# Patient Record
Sex: Female | Born: 1955 | Race: White | Hispanic: No
Health system: Southern US, Community
[De-identification: ages and names within clinical notes are randomized; demographics above are authoritative.]

## PROBLEM LIST (undated history)

## (undated) DIAGNOSIS — R319 Hematuria, unspecified: Secondary | ICD-10-CM

## (undated) DIAGNOSIS — I10 Essential (primary) hypertension: Secondary | ICD-10-CM

## (undated) DIAGNOSIS — C50919 Malignant neoplasm of unspecified site of unspecified female breast: Secondary | ICD-10-CM

## (undated) DIAGNOSIS — Z9221 Personal history of antineoplastic chemotherapy: Secondary | ICD-10-CM

## (undated) DIAGNOSIS — Z923 Personal history of irradiation: Secondary | ICD-10-CM

## (undated) DIAGNOSIS — K219 Gastro-esophageal reflux disease without esophagitis: Secondary | ICD-10-CM

## (undated) DIAGNOSIS — F419 Anxiety disorder, unspecified: Secondary | ICD-10-CM

## (undated) DIAGNOSIS — C3490 Malignant neoplasm of unspecified part of unspecified bronchus or lung: Secondary | ICD-10-CM

## (undated) DIAGNOSIS — S82891A Other fracture of right lower leg, initial encounter for closed fracture: Secondary | ICD-10-CM

## (undated) DIAGNOSIS — M1990 Unspecified osteoarthritis, unspecified site: Secondary | ICD-10-CM

## (undated) DIAGNOSIS — K259 Gastric ulcer, unspecified as acute or chronic, without hemorrhage or perforation: Secondary | ICD-10-CM

## (undated) DIAGNOSIS — C801 Malignant (primary) neoplasm, unspecified: Secondary | ICD-10-CM

## (undated) DIAGNOSIS — IMO0001 Reserved for inherently not codable concepts without codable children: Secondary | ICD-10-CM

## (undated) DIAGNOSIS — J301 Allergic rhinitis due to pollen: Secondary | ICD-10-CM

## (undated) DIAGNOSIS — Z87442 Personal history of urinary calculi: Secondary | ICD-10-CM

## (undated) DIAGNOSIS — N200 Calculus of kidney: Secondary | ICD-10-CM

## (undated) HISTORY — PX: ESOPHAGOGASTRODUODENOSCOPY: SHX1529

## (undated) HISTORY — PX: LITHOTRIPSY: SUR834

## (undated) HISTORY — DX: Unspecified osteoarthritis, unspecified site: M19.90

## (undated) HISTORY — DX: Allergic rhinitis due to pollen: J30.1

## (undated) HISTORY — DX: Essential (primary) hypertension: I10

## (undated) HISTORY — DX: Malignant (primary) neoplasm, unspecified: C80.1

## (undated) HISTORY — DX: Hematuria, unspecified: R31.9

## (undated) HISTORY — PX: COLONOSCOPY: SHX174

## (undated) HISTORY — PX: DENTAL SURGERY: SHX609

---
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
## 1998-06-15 HISTORY — PX: ANKLE FUSION: SHX881

## 1998-09-05 ENCOUNTER — Other Ambulatory Visit: Admission: RE | Admit: 1998-09-05 | Discharge: 1998-09-05 | Payer: Self-pay | Admitting: Gynecology

## 2000-04-28 ENCOUNTER — Other Ambulatory Visit: Admission: RE | Admit: 2000-04-28 | Discharge: 2000-04-28 | Payer: Self-pay | Admitting: Gynecology

## 2000-06-15 DIAGNOSIS — C801 Malignant (primary) neoplasm, unspecified: Secondary | ICD-10-CM

## 2000-06-15 HISTORY — DX: Malignant (primary) neoplasm, unspecified: C80.1

## 2000-06-15 HISTORY — PX: BREAST LUMPECTOMY: SHX2

## 2000-11-15 ENCOUNTER — Encounter: Admission: RE | Admit: 2000-11-15 | Discharge: 2000-11-15 | Payer: Self-pay | Admitting: *Deleted

## 2000-11-15 ENCOUNTER — Encounter: Payer: Self-pay | Admitting: *Deleted

## 2000-11-15 ENCOUNTER — Other Ambulatory Visit: Admission: RE | Admit: 2000-11-15 | Discharge: 2000-11-15 | Payer: Self-pay | Admitting: *Deleted

## 2000-11-25 ENCOUNTER — Ambulatory Visit (HOSPITAL_BASED_OUTPATIENT_CLINIC_OR_DEPARTMENT_OTHER): Admission: RE | Admit: 2000-11-25 | Discharge: 2000-11-25 | Payer: Self-pay | Admitting: General Surgery

## 2000-11-30 ENCOUNTER — Ambulatory Visit: Admission: RE | Admit: 2000-11-30 | Discharge: 2001-01-12 | Payer: Self-pay | Admitting: General Surgery

## 2000-12-23 ENCOUNTER — Encounter: Payer: Self-pay | Admitting: *Deleted

## 2000-12-23 ENCOUNTER — Ambulatory Visit (HOSPITAL_COMMUNITY): Admission: RE | Admit: 2000-12-23 | Discharge: 2000-12-23 | Payer: Self-pay | Admitting: *Deleted

## 2000-12-28 ENCOUNTER — Encounter: Payer: Self-pay | Admitting: General Surgery

## 2000-12-28 ENCOUNTER — Ambulatory Visit (HOSPITAL_COMMUNITY): Admission: RE | Admit: 2000-12-28 | Discharge: 2000-12-28 | Payer: Self-pay | Admitting: General Surgery

## 2001-01-13 ENCOUNTER — Ambulatory Visit: Admission: RE | Admit: 2001-01-13 | Discharge: 2001-04-13 | Payer: Self-pay | Admitting: General Surgery

## 2001-03-23 ENCOUNTER — Encounter: Payer: Self-pay | Admitting: *Deleted

## 2001-03-23 ENCOUNTER — Ambulatory Visit (HOSPITAL_COMMUNITY): Admission: RE | Admit: 2001-03-23 | Discharge: 2001-03-23 | Payer: Self-pay | Admitting: *Deleted

## 2001-04-07 ENCOUNTER — Ambulatory Visit (HOSPITAL_BASED_OUTPATIENT_CLINIC_OR_DEPARTMENT_OTHER): Admission: RE | Admit: 2001-04-07 | Discharge: 2001-04-07 | Payer: Self-pay | Admitting: General Surgery

## 2001-04-18 ENCOUNTER — Ambulatory Visit: Admission: RE | Admit: 2001-04-18 | Discharge: 2001-07-17 | Payer: Self-pay | Admitting: Radiation Oncology

## 2001-04-28 ENCOUNTER — Other Ambulatory Visit: Admission: RE | Admit: 2001-04-28 | Discharge: 2001-04-28 | Payer: Self-pay | Admitting: Gynecology

## 2001-06-15 DIAGNOSIS — C50919 Malignant neoplasm of unspecified site of unspecified female breast: Secondary | ICD-10-CM

## 2001-06-15 DIAGNOSIS — Z923 Personal history of irradiation: Secondary | ICD-10-CM

## 2001-06-15 DIAGNOSIS — Z9221 Personal history of antineoplastic chemotherapy: Secondary | ICD-10-CM

## 2001-06-15 HISTORY — PX: BREAST EXCISIONAL BIOPSY: SUR124

## 2001-06-15 HISTORY — DX: Personal history of irradiation: Z92.3

## 2001-06-15 HISTORY — DX: Malignant neoplasm of unspecified site of unspecified female breast: C50.919

## 2001-06-15 HISTORY — DX: Personal history of antineoplastic chemotherapy: Z92.21

## 2001-08-04 ENCOUNTER — Encounter: Admission: RE | Admit: 2001-08-04 | Discharge: 2001-08-04 | Payer: Self-pay | Admitting: *Deleted

## 2001-08-04 ENCOUNTER — Encounter: Payer: Self-pay | Admitting: *Deleted

## 2002-01-25 ENCOUNTER — Encounter: Payer: Self-pay | Admitting: *Deleted

## 2002-01-25 ENCOUNTER — Encounter: Admission: RE | Admit: 2002-01-25 | Discharge: 2002-01-25 | Payer: Self-pay | Admitting: *Deleted

## 2002-08-14 ENCOUNTER — Encounter: Admission: RE | Admit: 2002-08-14 | Discharge: 2002-08-14 | Payer: Self-pay | Admitting: *Deleted

## 2002-08-14 ENCOUNTER — Encounter: Payer: Self-pay | Admitting: *Deleted

## 2002-08-24 ENCOUNTER — Other Ambulatory Visit: Admission: RE | Admit: 2002-08-24 | Discharge: 2002-08-24 | Payer: Self-pay | Admitting: Obstetrics and Gynecology

## 2003-02-05 ENCOUNTER — Encounter: Payer: Self-pay | Admitting: Oncology

## 2003-02-05 ENCOUNTER — Encounter: Admission: RE | Admit: 2003-02-05 | Discharge: 2003-02-05 | Payer: Self-pay | Admitting: Oncology

## 2003-03-15 ENCOUNTER — Ambulatory Visit (HOSPITAL_COMMUNITY): Admission: RE | Admit: 2003-03-15 | Discharge: 2003-03-15 | Payer: Self-pay | Admitting: Obstetrics and Gynecology

## 2003-04-25 ENCOUNTER — Encounter: Admission: RE | Admit: 2003-04-25 | Discharge: 2003-04-25 | Payer: Self-pay | Admitting: Internal Medicine

## 2003-09-04 ENCOUNTER — Encounter: Admission: RE | Admit: 2003-09-04 | Discharge: 2003-09-04 | Payer: Self-pay | Admitting: Oncology

## 2004-03-03 ENCOUNTER — Encounter: Admission: RE | Admit: 2004-03-03 | Discharge: 2004-03-03 | Payer: Self-pay | Admitting: Oncology

## 2004-03-03 ENCOUNTER — Other Ambulatory Visit: Admission: RE | Admit: 2004-03-03 | Discharge: 2004-03-03 | Payer: Self-pay | Admitting: Obstetrics and Gynecology

## 2004-10-07 ENCOUNTER — Encounter: Admission: RE | Admit: 2004-10-07 | Discharge: 2004-10-07 | Payer: Self-pay | Admitting: Oncology

## 2005-02-23 ENCOUNTER — Ambulatory Visit: Payer: Self-pay | Admitting: Oncology

## 2005-12-28 ENCOUNTER — Encounter: Admission: RE | Admit: 2005-12-28 | Discharge: 2005-12-28 | Payer: Self-pay | Admitting: Internal Medicine

## 2006-02-18 ENCOUNTER — Other Ambulatory Visit: Admission: RE | Admit: 2006-02-18 | Discharge: 2006-02-18 | Payer: Self-pay | Admitting: Internal Medicine

## 2006-02-19 ENCOUNTER — Ambulatory Visit: Payer: Self-pay | Admitting: Oncology

## 2007-09-19 ENCOUNTER — Ambulatory Visit: Payer: Self-pay

## 2008-11-28 ENCOUNTER — Ambulatory Visit: Payer: Self-pay

## 2010-02-05 ENCOUNTER — Ambulatory Visit: Payer: Self-pay

## 2010-02-13 DIAGNOSIS — S82891A Other fracture of right lower leg, initial encounter for closed fracture: Secondary | ICD-10-CM

## 2010-02-13 HISTORY — DX: Other fracture of right lower leg, initial encounter for closed fracture: S82.891A

## 2010-07-06 ENCOUNTER — Encounter: Payer: Self-pay | Admitting: Internal Medicine

## 2011-11-17 ENCOUNTER — Ambulatory Visit: Payer: Self-pay

## 2013-01-12 ENCOUNTER — Ambulatory Visit: Payer: Self-pay | Admitting: Internal Medicine

## 2013-01-13 LAB — HM MAMMOGRAPHY

## 2013-03-01 ENCOUNTER — Ambulatory Visit: Payer: Self-pay

## 2013-08-17 ENCOUNTER — Encounter: Payer: Self-pay | Admitting: Family Medicine

## 2013-08-17 ENCOUNTER — Ambulatory Visit (INDEPENDENT_AMBULATORY_CARE_PROVIDER_SITE_OTHER): Payer: 59 | Admitting: Family Medicine

## 2013-08-17 VITALS — BP 158/102 | HR 84 | Temp 97.9°F | Ht 62.0 in | Wt 163.2 lb

## 2013-08-17 DIAGNOSIS — F419 Anxiety disorder, unspecified: Secondary | ICD-10-CM

## 2013-08-17 DIAGNOSIS — Z8249 Family history of ischemic heart disease and other diseases of the circulatory system: Secondary | ICD-10-CM

## 2013-08-17 DIAGNOSIS — R030 Elevated blood-pressure reading, without diagnosis of hypertension: Secondary | ICD-10-CM

## 2013-08-17 DIAGNOSIS — F32A Depression, unspecified: Secondary | ICD-10-CM | POA: Insufficient documentation

## 2013-08-17 DIAGNOSIS — F329 Major depressive disorder, single episode, unspecified: Secondary | ICD-10-CM | POA: Insufficient documentation

## 2013-08-17 DIAGNOSIS — Z1211 Encounter for screening for malignant neoplasm of colon: Secondary | ICD-10-CM

## 2013-08-17 DIAGNOSIS — IMO0001 Reserved for inherently not codable concepts without codable children: Secondary | ICD-10-CM | POA: Insufficient documentation

## 2013-08-17 DIAGNOSIS — F411 Generalized anxiety disorder: Secondary | ICD-10-CM

## 2013-08-17 LAB — LIPID PANEL
CHOL/HDL RATIO: 2
CHOLESTEROL: 234 mg/dL — AB (ref 0–200)
HDL: 106.1 mg/dL (ref >39.00)
LDL Cholesterol: 105 mg/dL — ABNORMAL HIGH (ref 0–99)
TRIGLYCERIDES: 113 mg/dL (ref 0.0–149.0)
VLDL: 22.6 mg/dL (ref 0.0–40.0)

## 2013-08-17 LAB — CBC WITH DIFFERENTIAL/PLATELET
BASOS PCT: 0.4 % (ref 0.0–3.0)
Basophils Absolute: 0 10*3/uL (ref 0.0–0.1)
EOS ABS: 0 10*3/uL (ref 0.0–0.7)
EOS PCT: 0.6 % (ref 0.0–5.0)
HCT: 38.1 % (ref 36.0–46.0)
HEMOGLOBIN: 13 g/dL (ref 12.0–15.0)
LYMPHS PCT: 32 % (ref 12.0–46.0)
Lymphs Abs: 2 10*3/uL (ref 0.7–4.0)
MCHC: 34.1 g/dL (ref 30.0–36.0)
MCV: 100 fl (ref 78.0–100.0)
Monocytes Absolute: 0.5 10*3/uL (ref 0.1–1.0)
Monocytes Relative: 7.4 % (ref 3.0–12.0)
NEUTROS ABS: 3.7 10*3/uL (ref 1.4–7.7)
Neutrophils Relative %: 59.6 % (ref 43.0–77.0)
Platelets: 259 10*3/uL (ref 150.0–400.0)
RBC: 3.81 Mil/uL — AB (ref 3.87–5.11)
RDW: 14.1 % (ref 11.5–14.6)
WBC: 6.2 10*3/uL (ref 4.5–10.5)

## 2013-08-17 LAB — TSH: TSH: 0.57 u[IU]/mL (ref 0.35–5.50)

## 2013-08-17 LAB — COMPREHENSIVE METABOLIC PANEL
ALBUMIN: 4.2 g/dL (ref 3.5–5.2)
ALT: 19 U/L (ref 0–35)
AST: 25 U/L (ref 0–37)
Alkaline Phosphatase: 57 U/L (ref 39–117)
BUN: 21 mg/dL (ref 6–23)
CALCIUM: 9.6 mg/dL (ref 8.4–10.5)
CHLORIDE: 106 meq/L (ref 96–112)
CO2: 26 mEq/L (ref 19–32)
CREATININE: 0.8 mg/dL (ref 0.4–1.2)
GFR: 82.07 mL/min (ref >60.00)
GLUCOSE: 93 mg/dL (ref 70–99)
POTASSIUM: 4.1 meq/L (ref 3.5–5.1)
Sodium: 139 mEq/L (ref 135–145)
Total Bilirubin: 0.7 mg/dL (ref 0.3–1.2)
Total Protein: 7.4 g/dL (ref 6.0–8.3)

## 2013-08-17 MED ORDER — TRAZODONE HCL 50 MG PO TABS: 25.0000 mg | ORAL_TABLET | Freq: Every evening | ORAL | Status: DC | PRN

## 2013-08-17 NOTE — Assessment & Plan Note (Signed)
>  45 minutes spent in face to face time with patient, >50% spent in counselling or coordination of care. Agreed to psychotherapy.  Will refer to Hilda Blades here. Trazadone prn insomnia.

## 2013-08-17 NOTE — Assessment & Plan Note (Signed)
?  due to anxiety. Follow up in 2 weeks-if remains elevated, will consider antihypertensives.

## 2013-08-17 NOTE — Assessment & Plan Note (Signed)
Risk stratify with lipid panel and CMET today.

## 2013-08-17 NOTE — Progress Notes (Signed)
Subjective:   Patient ID: Sarah Weeks, female    DOB: [birth year], 58 y.o.   MRN: [id number]  Sarah Weeks is a pleasant 58 y.o. year old female who presents to clinic today with Establish Care and Anxiety  on 08/17/2013  HPI: Has not had health insurance for years.  She did have breast CA in 2002 and has been getting yearly mammograms- last mammogram and pap smear at [facility] in 01/2013.  BP elevated today- under a lot of stressors.  Brings in BP log from previous MD- BP 120/80 and 138/90 last year.  Was previously on low dose HCTZ but it caused blurred vision.  Sister died a year ago last month- suicide and she has a lot of guilt and sadness.  Now caring for her ill mother and feels anxious most of the time.  Denies feeling depressed but does get tearful when she thinks or talks about her sister.  Strong FH of CAD- dad, sister and brother all had MI in their 42s.  She is a former smoker.  Has never had CP but has had palpitations- thinks they were panic attacks.    Not sleeping well.  No Si or HI.  Never had a colonoscopy.  Patient Active Problem List   Diagnosis Date Noted  . Family history of early CAD 08/17/2013  . Elevated blood pressure 08/17/2013  . Anxiety state, unspecified 08/17/2013   Past Medical History  Diagnosis Date  . Arthritis   . Cancer 2002    breast cancer  . Hay fever   . Hypertension    Past Surgical History  Procedure Laterality Date  . Breast lumpectomy  2002    L side  . Ankle fusion  2000   History  Substance Use Topics  . Smoking status: Former Research scientist (life sciences)  . Smokeless tobacco: Never Used  . Alcohol Use: Yes   Family History  Problem Relation Age of Onset  . Cancer Mother   . Alcohol abuse Father   . Heart disease Father   . Hypertension Father   . Cancer Sister   . Heart disease Sister   . Heart disease Brother   . Stroke Maternal Grandfather   . Diabetes Maternal Grandfather    Allergies  Allergen Reactions  . Hctz  [Hydrochlorothiazide]     Blurred vision    No current outpatient prescriptions on file prior to visit.   No current facility-administered medications on file prior to visit.   The PMH, PSH, Social History, Family History, Medications, and allergies have been reviewed in [facility], and have been updated if relevant.   Review of Systems  Unable to perform ROS Constitutional: Negative for fever, appetite change, fatigue and unexpected weight change.  Cardiovascular: Positive for palpitations. Negative for chest pain.  Neurological: Negative for seizures, light-headedness, numbness and headaches.  Psychiatric/Behavioral: Positive for sleep disturbance. Negative for suicidal ideas and self-injury. The patient is nervous/anxious.   All other systems reviewed and are negative.       Objective:  BP 168/84  Pulse 84  Temp(Src) 97.9 F (36.6 C) (Oral)  Ht 5\' 2"  (1.575 m)  Wt 163 lb 4 oz (74.05 kg)  BMI 29.85 kg/m2  SpO2 98%   Physical Exam  Nursing note and vitals reviewed. Constitutional: She appears well-developed and well-nourished. No distress.  HENT:  Head: Normocephalic and atraumatic.  Eyes: Pupils are equal, round, and reactive to light.  Cardiovascular: Normal rate and regular rhythm.   Skin: Skin is warm, dry  and intact.  Psychiatric: She has a normal mood and affect. Her speech is normal and behavior is normal. Judgment and thought content normal. Cognition and memory are normal.  Tearful but appropriate          Assessment & Plan:   No diagnosis found. No Follow-up on file.

## 2013-08-17 NOTE — Patient Instructions (Signed)
It was great to meet you.  Let's start Trazadone as needed at bedtime.  We will call you with your GI (colonoscopy) and therapy referrals.  Please come see me in 2 weeks.

## 2013-08-17 NOTE — Progress Notes (Signed)
Pre visit review using our clinic review tool, if applicable. No additional management support is needed unless otherwise documented below in the visit note. 

## 2013-08-18 ENCOUNTER — Encounter: Payer: Self-pay | Admitting: *Deleted

## 2013-08-25 ENCOUNTER — Encounter: Payer: Self-pay | Admitting: Internal Medicine

## 2013-08-25 ENCOUNTER — Ambulatory Visit (INDEPENDENT_AMBULATORY_CARE_PROVIDER_SITE_OTHER): Payer: 59 | Admitting: Internal Medicine

## 2013-08-25 VITALS — BP 120/78 | HR 81 | Temp 98.3°F | Wt 160.5 lb

## 2013-08-25 DIAGNOSIS — G44209 Tension-type headache, unspecified, not intractable: Secondary | ICD-10-CM

## 2013-08-25 MED ORDER — NAPROXEN 500 MG PO TABS: 500.0000 mg | ORAL_TABLET | Freq: Two times a day (BID) | ORAL | Status: DC

## 2013-08-25 NOTE — Progress Notes (Signed)
Subjective:    Patient ID: Sarah Weeks, female    DOB: 1955/08/26, 58 y.o.   MRN: [id number]  HPI  Pt presents to the clinic today with c/o neck pain and headache. She reports this started 3 weeks ago. The headaches are constant. She wakes up with it and goes to bed with it. She denies dizziness and blurred vision. She denies sensitivity to light and sound. She has tried Ibuprofen OTC without any relief. She does report that her mom did give her a pain pill last night (she doesn't know what kind) but she reports it made her throw up.  Review of Systems      Past Medical History  Diagnosis Date  . Arthritis   . Cancer 2002    breast cancer  . Hay fever   . Hypertension     Current Outpatient Prescriptions  Medication Sig Dispense Refill  . Biotin 7500 MCG TABS Take 1 tablet by mouth daily.      . Cholecalciferol (VITAMIN D3) 1000 UNITS CAPS Take 1 capsule by mouth.      Marland Kitchen ibuprofen (ADVIL,MOTRIN) 200 MG tablet Take 200 mg by mouth daily as needed.      . Multiple Vitamin (MULTIVITAMIN) tablet Take 2 tablets by mouth daily.       No current facility-administered medications for this visit.    Allergies  Allergen Reactions  . Hctz [Hydrochlorothiazide]     Blurred vision     Family History  Problem Relation Age of Onset  . Cancer Mother   . Alcohol abuse Father   . Heart disease Father   . Hypertension Father   . Cancer Sister   . Heart disease Sister   . Heart disease Brother   . Stroke Maternal Grandfather   . Diabetes Maternal Grandfather     History   Social History  . Marital Status: Married    Spouse Name: N/A    Number of Children: N/A  . Years of Education: N/A   Occupational History  . Not on file.   Social History Main Topics  . Smoking status: Former Research scientist (life sciences)  . Smokeless tobacco: Never Used  . Alcohol Use: Yes     Comment: occasional  . Drug Use: No  . Sexual Activity: No   Other Topics Concern  . Not on file   Social History  Narrative  . No narrative on file     Constitutional: Pt reports headache. Denies fever, malaise, fatigue, or abrupt weight changes.  HEENT: Denies eye pain, eye redness, ear pain, ringing in the ears, wax buildup, runny nose, nasal congestion, bloody nose, or sore throat. Neurological: Denies dizziness, difficulty with memory, difficulty with speech or problems with balance and coordination.   No other specific complaints in a complete review of systems (except as listed in HPI above).  Objective:   Physical Exam   BP 120/78  Pulse 81  Temp(Src) 98.3 F (36.8 C) (Oral)  Wt 160 lb 8 oz (72.802 kg)  SpO2 98% Wt Readings from Last 3 Encounters:  08/25/13 160 lb 8 oz (72.802 kg)  08/17/13 163 lb 4 oz (74.05 kg)    General: Appears her stated age, well developed, well nourished in NAD. HEENT: Head: normal shape and size; Eyes: sclera white, no icterus, conjunctiva pink, PERRLA and EOMs intact; Ears: Tm's gray and intact, normal light reflex; Nose: mucosa pink and moist, septum midline; Throat/Mouth: Teeth present, mucosa pink and moist, no exudate, lesions or ulcerations noted.  Cardiovascular: Normal rate and rhythm. S1,S2 noted.  No murmur, rubs or gallops noted. No JVD or BLE edema. No carotid bruits noted. Pulmonary/Chest: Normal effort and positive vesicular breath sounds. No respiratory distress. No wheezes, rales or ronchi noted.  Neurological: Alert and oriented. Cranial nerves II-XII intact. Coordination normal. +DTRs bilaterally.   BMET    Component Value Date/Time   NA 139 08/17/2013 1519   K 4.1 08/17/2013 1519   CL 106 08/17/2013 1519   CO2 26 08/17/2013 1519   GLUCOSE 93 08/17/2013 1519   BUN 21 08/17/2013 1519   CREATININE 0.8 08/17/2013 1519   CALCIUM 9.6 08/17/2013 1519    Lipid Panel     Component Value Date/Time   CHOL 234* 08/17/2013 1519   TRIG 113.0 08/17/2013 1519   HDL 106.10 08/17/2013 1519   CHOLHDL 2 08/17/2013 1519   VLDL 22.6 08/17/2013 1519   LDLCALC 105*  08/17/2013 1519    CBC    Component Value Date/Time   WBC 6.2 08/17/2013 1519   RBC 3.81* 08/17/2013 1519   HGB 13.0 08/17/2013 1519   HCT 38.1 08/17/2013 1519   PLT 259.0 08/17/2013 1519   MCV 100.0 08/17/2013 1519   MCHC 34.1 08/17/2013 1519   RDW 14.1 08/17/2013 1519   LYMPHSABS 2.0 08/17/2013 1519   MONOABS 0.5 08/17/2013 1519   EOSABS 0.0 08/17/2013 1519   BASOSABS 0.0 08/17/2013 1519    Hgb A1C No results found for this basename: HGBA1C        Assessment & Plan:   Tension headache secondary to stress:  Encouraged neck exercises, handout given Try massage therapy for some relief eRx for Naproxen 500 mg BID with meals Do not take Ibuprofen while taking the Naproxen  RTC as needed or if symptoms persist or worsen

## 2013-08-25 NOTE — Progress Notes (Signed)
Pre visit review using our clinic review tool, if applicable. No additional management support is needed unless otherwise documented below in the visit note. 

## 2013-08-25 NOTE — Patient Instructions (Addendum)

## 2013-09-01 ENCOUNTER — Ambulatory Visit (INDEPENDENT_AMBULATORY_CARE_PROVIDER_SITE_OTHER): Payer: 59 | Admitting: Family Medicine

## 2013-09-01 ENCOUNTER — Encounter: Payer: Self-pay | Admitting: Family Medicine

## 2013-09-01 VITALS — BP 142/92 | HR 102 | Temp 97.9°F | Wt 162.5 lb

## 2013-09-01 DIAGNOSIS — G44209 Tension-type headache, unspecified, not intractable: Secondary | ICD-10-CM

## 2013-09-01 MED ORDER — CYCLOBENZAPRINE HCL 10 MG PO TABS
ORAL_TABLET | ORAL | Status: DC
Start: 2013-09-01 — End: 2013-10-26

## 2013-09-01 NOTE — Patient Instructions (Signed)

## 2013-09-01 NOTE — Assessment & Plan Note (Signed)
Agree these seem like tension HA. Will try cyclobenzaprine qhs. If no improvement, refer to neuro/HA and wellness center for further evaluation/tx.

## 2013-09-01 NOTE — Progress Notes (Signed)
Pre visit review using our clinic review tool, if applicable. No additional management support is needed unless otherwise documented below in the visit note. 

## 2013-09-01 NOTE — Progress Notes (Signed)
Subjective:    Patient ID: Sarah Weeks, female    DOB: 14-Apr-1956, 58 y.o.   MRN: [id number]  Headache    Very pleasant 58 yo female here for follow up HA.  Saw [facility] two weeks ago for over two months of intermittent neck and head pain.  She does grind her teeth- wears a mouth guard.  Ears do not hurt.  Does not wake up in the middle of night or morning with a headache.  No photophobia.  Does have some nausea, no vomiting.  Sarah Weeks started her on prescription strength naproxen which did not help.   She feels headaches are the same.  Neck feels stiff- hurts to turn head to left.    Review of Systems  Neurological: Positive for headaches.        Past Medical History  Diagnosis Date  . Arthritis   . Cancer 2002    breast cancer  . Hay fever   . Hypertension     Current Outpatient Prescriptions  Medication Sig Dispense Refill  . Biotin 7500 MCG TABS Take 1 tablet by mouth daily.      . Cholecalciferol (VITAMIN D3) 1000 UNITS CAPS Take 1 capsule by mouth.      Marland Kitchen ibuprofen (ADVIL,MOTRIN) 200 MG tablet Take 200 mg by mouth daily as needed.      . Multiple Vitamin (MULTIVITAMIN) tablet Take 2 tablets by mouth daily.      . naproxen (NAPROSYN) 500 MG tablet Take 1 tablet (500 mg total) by mouth 2 (two) times daily with a meal.  30 tablet  0  . cyclobenzaprine (FLEXERIL) 10 MG tablet 1/2- 1 tablet nightly as needed for headache/muscle spasm  30 tablet  0   No current facility-administered medications for this visit.    Allergies  Allergen Reactions  . Hctz [Hydrochlorothiazide]     Blurred vision     Family History  Problem Relation Age of Onset  . Cancer Mother   . Alcohol abuse Father   . Heart disease Father   . Hypertension Father   . Cancer Sister   . Heart disease Sister   . Heart disease Brother   . Stroke Maternal Grandfather   . Diabetes Maternal Grandfather     History   Social History  . Marital Status: Married    Spouse Name: N/A     Number of Children: N/A  . Years of Education: N/A   Occupational History  . Not on file.   Social History Main Topics  . Smoking status: Former Research scientist (life sciences)  . Smokeless tobacco: Never Used  . Alcohol Use: Yes     Comment: occasional  . Drug Use: No  . Sexual Activity: No   Other Topics Concern  . Not on file   Social History Narrative  . No narrative on file     Constitutional: Pt reports headache. Denies fever, malaise, fatigue, or abrupt weight changes.  HEENT: Denies eye pain, eye redness, ear pain, ringing in the ears, wax buildup, runny nose, nasal congestion, bloody nose, or sore throat. Neurological: Denies dizziness, difficulty with memory, difficulty with speech or problems with balance and coordination.   No other specific complaints in a complete review of systems (except as listed in HPI above).  Objective:   Physical Exam   BP 142/92  Pulse 102  Temp(Src) 97.9 F (36.6 C) (Oral)  Wt 162 lb 8 oz (73.71 kg)  SpO2 96% Wt Readings from Last 3 Encounters:  09/01/13  162 lb 8 oz (73.71 kg)  08/25/13 160 lb 8 oz (72.802 kg)  08/17/13 163 lb 4 oz (74.05 kg)    General: Appears her stated age, well developed, well nourished in NAD. HEENT: Head: normal shape and size; Eyes: sclera white, no icterus, conjunctiva pink, PERRLA and EOMs intact; Ears: Tm's gray and intact, normal light reflex; Nose: mucosa pink and moist, septum midline; Throat/Mouth: Teeth present, mucosa pink and moist, no exudate, lesions or ulcerations noted.  Cardiovascular: Normal rate and rhythm. S1,S2 noted.  No murmur, rubs or gallops noted. No JVD or BLE edema. No carotid bruits noted. Pulmonary/Chest: Normal effort and positive vesicular breath sounds. No respiratory distress. No wheezes, rales or ronchi noted.  Neurological: Alert and oriented. Cranial nerves II-XII intact. Coordination normal. +DTRs bilaterally.         Assessment & Plan:

## 2013-09-14 ENCOUNTER — Ambulatory Visit: Payer: 59 | Admitting: Psychology

## 2013-10-05 ENCOUNTER — Ambulatory Visit: Payer: Self-pay | Admitting: Gastroenterology

## 2013-10-16 ENCOUNTER — Ambulatory Visit: Payer: Self-pay | Admitting: Gastroenterology

## 2013-10-16 LAB — HM COLONOSCOPY: HM Colonoscopy: 10

## 2013-10-21 LAB — PATHOLOGY REPORT

## 2013-10-26 ENCOUNTER — Ambulatory Visit (INDEPENDENT_AMBULATORY_CARE_PROVIDER_SITE_OTHER): Payer: 59 | Admitting: Family Medicine

## 2013-10-26 ENCOUNTER — Encounter: Payer: Self-pay | Admitting: Family Medicine

## 2013-10-26 VITALS — BP 140/76 | HR 103 | Temp 98.1°F | Wt 163.2 lb

## 2013-10-26 DIAGNOSIS — G4700 Insomnia, unspecified: Secondary | ICD-10-CM | POA: Insufficient documentation

## 2013-10-26 DIAGNOSIS — K2990 Gastroduodenitis, unspecified, without bleeding: Secondary | ICD-10-CM

## 2013-10-26 DIAGNOSIS — G44209 Tension-type headache, unspecified, not intractable: Secondary | ICD-10-CM

## 2013-10-26 DIAGNOSIS — K2970 Gastritis, unspecified, without bleeding: Secondary | ICD-10-CM | POA: Insufficient documentation

## 2013-10-26 MED ORDER — METHOCARBAMOL 500 MG PO TABS: 500.0000 mg | ORAL_TABLET | Freq: Four times a day (QID) | ORAL | Status: DC | PRN

## 2013-10-26 MED ORDER — CELECOXIB 200 MG PO CAPS: 200.0000 mg | ORAL_CAPSULE | Freq: Two times a day (BID) | ORAL | Status: DC | PRN

## 2013-10-26 MED ORDER — ZOLPIDEM TARTRATE 5 MG PO TABS: 5.0000 mg | ORAL_TABLET | Freq: Every evening | ORAL | Status: DC | PRN

## 2013-10-26 NOTE — Assessment & Plan Note (Signed)
>  25 min spent with patient, at least half of which was spent on counseling insomnia.  The problem of recurrent insomnia is discussed. Avoidance of caffeine sources is strongly encouraged. Sleep hygiene issues are reviewed. The use of sedative hypnotics for temporary relief is appropriate; we discussed the addictive nature of these drugs, and a one-time only prescription for prn use of a hypnotic is given, to use no more than 3 times per week for 2-3 weeks.

## 2013-10-26 NOTE — Progress Notes (Signed)
Subjective:    Patient ID: Sarah Weeks, female    DOB: [birth year], 58 y.o.   MRN: [id number]  Headache    Very pleasant 58 yo female here for follow up several issues.  1.  Erosive gastritis- had endoscopy (Dr. Gustavo Lah) on 10/16/13- she brings results with her. Per pt, he advised her to stop taking Ibuprofen and start taking Celebrex. No bleeding ulcers- she has not noticed blood in her stool or black stools.  2.  HA- not improving.  When I saw her in 08/2013, given rx for flexeril and advised massage therapy.  She has had no relief  HA are not associated with photophobia or nausea.  3.  Insomnia- trazadone that I prescribed is not helping.  Drinking Vodka at night to help her sleep.  .  Patient Active Problem List   Diagnosis Date Noted  . Gastritis without bleeding 10/26/2013  . Insomnia 10/26/2013  . Tension headache 09/01/2013  . Family history of early CAD 08/17/2013  . Elevated blood pressure 08/17/2013  . Anxiety state, unspecified 08/17/2013   Past Medical History  Diagnosis Date  . Arthritis   . Cancer 2002    breast cancer  . Hay fever   . Hypertension    Past Surgical History  Procedure Laterality Date  . Breast lumpectomy  2002    L side  . Ankle fusion  2000   History  Substance Use Topics  . Smoking status: Former Research scientist (life sciences)  . Smokeless tobacco: Never Used  . Alcohol Use: Yes     Comment: occasional   Family History  Problem Relation Age of Onset  . Cancer Mother   . Alcohol abuse Father   . Heart disease Father   . Hypertension Father   . Cancer Sister   . Heart disease Sister   . Heart disease Brother   . Stroke Maternal Grandfather   . Diabetes Maternal Grandfather    Allergies  Allergen Reactions  . Hctz [Hydrochlorothiazide]     Blurred vision    Current Outpatient Prescriptions on File Prior to Visit  Medication Sig Dispense Refill  . Biotin 7500 MCG TABS Take 1 tablet by mouth daily.      . Cholecalciferol (VITAMIN D3) 1000  UNITS CAPS Take 1 capsule by mouth.      . Multiple Vitamin (MULTIVITAMIN) tablet Take 2 tablets by mouth daily.       No current facility-administered medications on file prior to visit.   The PMH, PSH, Social History, Family History, Medications, and allergies have been reviewed in [facility], and have been updated if relevant.  Review of Systems  Neurological: Positive for headaches.   See HPI     Past Medical History  Diagnosis Date  . Arthritis   . Cancer 2002    breast cancer  . Hay fever   . Hypertension     Current Outpatient Prescriptions  Medication Sig Dispense Refill  . Biotin 7500 MCG TABS Take 1 tablet by mouth daily.      . Cholecalciferol (VITAMIN D3) 1000 UNITS CAPS Take 1 capsule by mouth.      . Multiple Vitamin (MULTIVITAMIN) tablet Take 2 tablets by mouth daily.      . pantoprazole (PROTONIX) 40 MG tablet Take 40 mg by mouth daily. Take 1 tablet one hour before meal once a day for six weeks      . celecoxib (CELEBREX) 200 MG capsule Take 1 capsule (200 mg total) by mouth 2 (two) times  daily as needed.  60 capsule  0  . methocarbamol (ROBAXIN) 500 MG tablet Take 1 tablet (500 mg total) by mouth every 6 (six) hours as needed for muscle spasms.  30 tablet  3  . zolpidem (AMBIEN) 5 MG tablet Take 1 tablet (5 mg total) by mouth at bedtime as needed for sleep.  30 tablet  1   No current facility-administered medications for this visit.    Allergies  Allergen Reactions  . Hctz [Hydrochlorothiazide]     Blurred vision     Family History  Problem Relation Age of Onset  . Cancer Mother   . Alcohol abuse Father   . Heart disease Father   . Hypertension Father   . Cancer Sister   . Heart disease Sister   . Heart disease Brother   . Stroke Maternal Grandfather   . Diabetes Maternal Grandfather     History   Social History  . Marital Status: Married    Spouse Name: N/A    Number of Children: N/A  . Years of Education: N/A   Occupational History  . Not  on file.   Social History Main Topics  . Smoking status: Former Research scientist (life sciences)  . Smokeless tobacco: Never Used  . Alcohol Use: Yes     Comment: occasional  . Drug Use: No  . Sexual Activity: No   Other Topics Concern  . Not on file   Social History Narrative  . No narrative on file     Objective:   Physical Exam   BP 140/76  Pulse 103  Temp(Src) 98.1 F (36.7 C) (Oral)  Wt 163 lb 4 oz (74.05 kg)  SpO2 97% Wt Readings from Last 3 Encounters:  10/26/13 163 lb 4 oz (74.05 kg)  09/01/13 162 lb 8 oz (73.71 kg)  08/25/13 160 lb 8 oz (72.802 kg)    General: Appears her stated age, well developed, well nourished in NAD. HEENT: Head: normal shape and size; Eyes: sclera white, no icterus, conjunctiva pink, PERRLA and EOMs intact; Ears: Tm's gray and intact, normal light reflex; Nose: mucosa pink and moist, septum midline; Throat/Mouth: Teeth present, mucosa pink and moist, no exudate, lesions or ulcerations noted.  Cardiovascular: Normal rate and rhythm. S1,S2 noted.  No murmur, rubs or gallops noted. No JVD or BLE edema. No carotid bruits noted. Pulmonary/Chest: Normal effort and positive vesicular breath sounds. No respiratory distress. No wheezes, rales or ronchi noted.  Neurological: Alert and oriented. Cranial nerves II-XII intact. Coordination normal. +DTRs bilaterally.         Assessment & Plan:

## 2013-10-26 NOTE — Assessment & Plan Note (Signed)
Persistent. Will refer to [facility] for further evaluation and tx of HA. Advised to d/c flexeril, will give short course of robaxin.

## 2013-10-26 NOTE — Patient Instructions (Addendum)
Great to see you. I sent in a new prescription for robaxin and celebrex.  Please do not drink alcohol with ambien.  Also alcohol can worsens your stomach ulcers.  We will call you with your referral to a headache specialist.

## 2013-10-26 NOTE — Progress Notes (Signed)
Pre visit review using our clinic review tool, if applicable. No additional management support is needed unless otherwise documented below in the visit note. 

## 2013-10-26 NOTE — Assessment & Plan Note (Signed)
Advised to stop drinking ETOH as this will worsen her gastritis, especially at bedtime on empty stomach like she has been doing. Continue PPI. The patient indicates understanding of these issues and agrees with the plan.

## 2013-10-27 ENCOUNTER — Encounter: Payer: Self-pay | Admitting: Family Medicine

## 2013-10-30 ENCOUNTER — Telehealth: Payer: Self-pay

## 2013-10-30 NOTE — Telephone Encounter (Signed)
Spoke to pt and advised per Dr Deborra Medina and pt verbally expressed understanding. Pt states that she prefers to take 2tabs at night and will contact us with an update before the week is out

## 2013-10-30 NOTE — Telephone Encounter (Signed)
Pt left v/m; zolpidem is not helping pt to stay asleep; pt waking up in middle of night and pt cannot return to sleep; pt request different med for sleep. Pt request cb. [place]

## 2013-10-30 NOTE — Telephone Encounter (Signed)
We started a low dose.  She could try to take another 5 mg if she wakes up in the middle of night but she may be very sleepy in the morning.  The alternative would be to take two 5 mg tablets at bedtime and call us with an update in a couple of days.

## 2013-11-03 DIAGNOSIS — R519 Headache, unspecified: Secondary | ICD-10-CM | POA: Insufficient documentation

## 2013-11-03 DIAGNOSIS — R51 Headache: Secondary | ICD-10-CM

## 2013-11-09 ENCOUNTER — Encounter: Payer: Self-pay | Admitting: Family Medicine

## 2013-11-09 ENCOUNTER — Other Ambulatory Visit: Payer: Self-pay

## 2013-11-09 MED ORDER — ZOLPIDEM TARTRATE 5 MG PO TABS: 5.0000 mg | ORAL_TABLET | Freq: Every evening | ORAL | Status: DC | PRN

## 2013-11-09 NOTE — Telephone Encounter (Signed)
Lm on pts vm informing her Rx has been called in to requested pharmacy 

## 2013-11-09 NOTE — Telephone Encounter (Signed)
Pt left v/m requesting cb when refill done.

## 2013-11-09 NOTE — Telephone Encounter (Signed)
Pt left v/m requesting refill zolpidem 5 mg with new instructions taking 2 tabs at hs. Pt was waking up in middle of night and pt was advised could take two zolpidem 5 mg at bedtime. Unable to reach pt for update on how doing since taking zolpidem 5 mg two tabs at hs. [facility] [address]

## 2013-11-10 ENCOUNTER — Telehealth: Payer: Self-pay

## 2013-11-10 MED ORDER — ZOLPIDEM TARTRATE 10 MG PO TABS: 10.0000 mg | ORAL_TABLET | Freq: Every evening | ORAL | Status: DC | PRN

## 2013-11-10 NOTE — Telephone Encounter (Signed)
Ok to send in 30 day supply of ambien 10 mg as requested.

## 2013-11-10 NOTE — Telephone Encounter (Signed)
Spoke to pt and informed her new Rx for ambien 10mg  called in to requested pharmacy

## 2013-11-10 NOTE — Telephone Encounter (Signed)
Pt left v/m; pt said ins will not cover ambien 5 mg taking 2 tabs but will cover ambien 10 mg taking 1 tab daily. Pt request new rx sent to [facility] [address]. Pt request cb.

## 2013-11-15 ENCOUNTER — Encounter: Payer: Self-pay | Admitting: Family Medicine

## 2013-11-20 ENCOUNTER — Ambulatory Visit: Payer: Self-pay | Admitting: Gastroenterology

## 2013-11-23 DIAGNOSIS — K259 Gastric ulcer, unspecified as acute or chronic, without hemorrhage or perforation: Secondary | ICD-10-CM | POA: Insufficient documentation

## 2013-12-08 ENCOUNTER — Other Ambulatory Visit: Payer: Self-pay

## 2013-12-08 MED ORDER — ZOLPIDEM TARTRATE 10 MG PO TABS: 10.0000 mg | ORAL_TABLET | Freq: Every evening | ORAL | Status: DC | PRN

## 2013-12-08 NOTE — Telephone Encounter (Signed)
Pt left v/m requesting refill ambien to [facility] [address]. Please advise.

## 2013-12-08 NOTE — Telephone Encounter (Signed)
Spoke to pt and informed her Rx has been called in to requested pharmacy 

## 2014-01-11 ENCOUNTER — Other Ambulatory Visit: Payer: Self-pay | Admitting: *Deleted

## 2014-01-11 MED ORDER — ZOLPIDEM TARTRATE 10 MG PO TABS: 10.0000 mg | ORAL_TABLET | Freq: Every evening | ORAL | Status: DC | PRN

## 2014-01-11 NOTE — Telephone Encounter (Signed)
Rx called in to requested pharmacy 

## 2014-02-07 ENCOUNTER — Other Ambulatory Visit: Payer: Self-pay | Admitting: *Deleted

## 2014-02-07 MED ORDER — ZOLPIDEM TARTRATE 10 MG PO TABS: 10.0000 mg | ORAL_TABLET | Freq: Every evening | ORAL | Status: DC | PRN

## 2014-02-07 NOTE — Telephone Encounter (Signed)
#  30x0 last filled 01/12/14, pt's last ov discussing med was 10/26/13. No f/u appt scheduled.

## 2014-02-07 NOTE — Telephone Encounter (Signed)
rx called into pharmacy

## 2014-05-22 ENCOUNTER — Ambulatory Visit: Payer: 59 | Admitting: Family Medicine

## 2014-05-24 ENCOUNTER — Ambulatory Visit (INDEPENDENT_AMBULATORY_CARE_PROVIDER_SITE_OTHER): Payer: 59 | Admitting: Family Medicine

## 2014-05-24 ENCOUNTER — Encounter: Payer: Self-pay | Admitting: Family Medicine

## 2014-05-24 VITALS — BP 130/88 | HR 92 | Temp 98.0°F | Wt 160.0 lb

## 2014-05-24 DIAGNOSIS — Z1239 Encounter for other screening for malignant neoplasm of breast: Secondary | ICD-10-CM

## 2014-05-24 DIAGNOSIS — R5383 Other fatigue: Secondary | ICD-10-CM

## 2014-05-24 DIAGNOSIS — Z23 Encounter for immunization: Secondary | ICD-10-CM

## 2014-05-24 DIAGNOSIS — Z87891 Personal history of nicotine dependence: Secondary | ICD-10-CM

## 2014-05-24 DIAGNOSIS — Z7189 Other specified counseling: Secondary | ICD-10-CM | POA: Insufficient documentation

## 2014-05-24 DIAGNOSIS — R0602 Shortness of breath: Secondary | ICD-10-CM | POA: Insufficient documentation

## 2014-05-24 LAB — CBC WITH DIFFERENTIAL/PLATELET
BASOS PCT: 0.4 % (ref 0.0–3.0)
Basophils Absolute: 0 10*3/uL (ref 0.0–0.1)
EOS ABS: 0.1 10*3/uL (ref 0.0–0.7)
EOS PCT: 1.7 % (ref 0.0–5.0)
HCT: 42.3 % (ref 36.0–46.0)
Hemoglobin: 14.1 g/dL (ref 12.0–15.0)
Lymphocytes Relative: 24.2 % (ref 12.0–46.0)
Lymphs Abs: 2 10*3/uL (ref 0.7–4.0)
MCHC: 33.4 g/dL (ref 30.0–36.0)
MCV: 98.3 fl (ref 78.0–100.0)
MONO ABS: 0.8 10*3/uL (ref 0.1–1.0)
Monocytes Relative: 9.5 % (ref 3.0–12.0)
NEUTROS PCT: 64.2 % (ref 43.0–77.0)
Neutro Abs: 5.3 10*3/uL (ref 1.4–7.7)
Platelets: 332 10*3/uL (ref 150.0–400.0)
RBC: 4.3 Mil/uL (ref 3.87–5.11)
RDW: 12.5 % (ref 11.5–15.5)
WBC: 8.3 10*3/uL (ref 4.0–10.5)

## 2014-05-24 LAB — COMPREHENSIVE METABOLIC PANEL
ALT: 27 U/L (ref 0–35)
AST: 27 U/L (ref 0–37)
Albumin: 4.3 g/dL (ref 3.5–5.2)
Alkaline Phosphatase: 95 U/L (ref 39–117)
BUN: 19 mg/dL (ref 6–23)
CALCIUM: 9.9 mg/dL (ref 8.4–10.5)
CHLORIDE: 105 meq/L (ref 96–112)
CO2: 27 meq/L (ref 19–32)
Creatinine, Ser: 0.7 mg/dL (ref 0.4–1.2)
GFR: 89.88 mL/min (ref >60.00)
GLUCOSE: 97 mg/dL (ref 70–99)
POTASSIUM: 4.7 meq/L (ref 3.5–5.1)
SODIUM: 139 meq/L (ref 135–145)
TOTAL PROTEIN: 7.8 g/dL (ref 6.0–8.3)
Total Bilirubin: 0.4 mg/dL (ref 0.2–1.2)

## 2014-05-24 LAB — VITAMIN D 25 HYDROXY (VIT D DEFICIENCY, FRACTURES): VITD: 46.05 ng/mL (ref 30.00–100.00)

## 2014-05-24 LAB — TSH: TSH: 0.79 u[IU]/mL (ref 0.35–4.50)

## 2014-05-24 NOTE — Progress Notes (Signed)
Subjective:   Patient ID: Sarah Weeks, female    DOB: 09-Apr-1956, 58 y.o.   MRN: [id number]  Sarah Weeks is a pleasant 58 y.o. year old female who presents to clinic today with Follow-up  on 05/24/2014  HPI: Here to discuss several issues-  Preventative care- has not had a CPX in years- did not have health insurance. Wants to talk about what she is due for.  Tobacco abuse- long term smoker- >30 year pack year. Quit recently and her cough is completely gone!  She has short of breath with exertion for months to years.  She always thought it would stop when she quit smoking.  No CP.  No hemoptysis.  Happens mainly when she is out in the cold. No night sweats or fevers.  Appetite good- weight pretty stable.  Has been more fatigued.  No blood in her stool. Colonoscopy 10/16/13  Wt Readings from Last 3 Encounters:  05/24/14 160 lb (72.576 kg)  10/26/13 163 lb 4 oz (74.05 kg)  09/01/13 162 lb 8 oz (73.71 kg)   Current Outpatient Prescriptions on File Prior to Visit  Medication Sig Dispense Refill  . Biotin 7500 MCG TABS Take 1 tablet by mouth daily.    . Cholecalciferol (VITAMIN D3) 1000 UNITS CAPS Take 1 capsule by mouth.    . Multiple Vitamin (MULTIVITAMIN) tablet Take 2 tablets by mouth daily.    Marland Kitchen zolpidem (AMBIEN) 10 MG tablet Take 1 tablet (10 mg total) by mouth at bedtime as needed for sleep. 30 tablet 0   No current facility-administered medications on file prior to visit.    Allergies  Allergen Reactions  . Hctz [Hydrochlorothiazide]     Blurred vision     Past Medical History  Diagnosis Date  . Arthritis   . Cancer 2002    breast cancer  . Hay fever   . Hypertension     Past Surgical History  Procedure Laterality Date  . Breast lumpectomy  2002    L side  . Ankle fusion  2000    Family History  Problem Relation Age of Onset  . Cancer Mother   . Alcohol abuse Father   . Heart disease Father   . Hypertension Father   . Cancer Sister   . Heart  disease Sister   . Heart disease Brother   . Stroke Maternal Grandfather   . Diabetes Maternal Grandfather     History   Social History  . Marital Status: Divorced    Spouse Name: N/A    Number of Children: N/A  . Years of Education: N/A   Occupational History  . Not on file.   Social History Main Topics  . Smoking status: Former Research scientist (life sciences)  . Smokeless tobacco: Never Used  . Alcohol Use: Yes     Comment: occasional  . Drug Use: No  . Sexual Activity: No   Other Topics Concern  . Not on file   Social History Narrative   The PMH, PSH, Social History, Family History, Medications, and allergies have been reviewed in [facility], and have been updated if relevant.    Review of Systems  Constitutional: Positive for fatigue. Negative for fever, chills, appetite change and unexpected weight change.  HENT: Negative.   Respiratory: Positive for shortness of breath. Negative for apnea, cough, choking, chest tightness, wheezing and stridor.   Cardiovascular: Negative.   Gastrointestinal: Negative.   Genitourinary: Negative.   Musculoskeletal: Negative.   Skin: Negative.   Neurological: Negative.  Hematological: Negative.   Psychiatric/Behavioral: Negative.   All other systems reviewed and are negative.      Objective:    BP 130/88 mmHg  Pulse 92  Temp(Src) 98 F (36.7 C) (Oral)  Wt 160 lb (72.576 kg)  SpO2 99%   Physical Exam  Constitutional: She is oriented to person, place, and time. She appears well-developed and well-nourished. No distress.  HENT:  Head: Normocephalic.  Eyes: Pupils are equal, round, and reactive to light.  Neck: Normal range of motion. Neck supple. Carotid bruit is not present.  Cardiovascular: Normal rate, regular rhythm and normal heart sounds.   Pulmonary/Chest: Effort normal and breath sounds normal. No respiratory distress. She has no wheezes. She has no rales. She exhibits no tenderness.  Abdominal: Soft. Bowel sounds are normal.    Musculoskeletal: She exhibits no edema or tenderness.  Neurological: She is alert and oriented to person, place, and time. No cranial nerve deficit. Coordination normal.  Skin: Skin is warm and dry.  Psychiatric: She has a normal mood and affect. Her behavior is normal. Judgment and thought content normal.  Nursing note and vitals reviewed.         Assessment & Plan:   Former heavy cigarette smoker (20-39 per day) - Plan: CT CHEST LOW DOSE SCREENING W/O CM  Screening for breast cancer - Plan: MM Digital Screening  SOB (shortness of breath) on exertion - Plan: Comprehensive metabolic panel, CBC with Differential, TSH  Other fatigue - Plan: Vitamin D, 25-hydroxy  Need for influenza vaccination - Plan: Flu Vaccine QUAD 36+ mos PF IM (Fluarix Quad PF)  Need for Tdap vaccination - Plan: Tdap vaccine greater than or equal to 7yo IM No Follow-up on file.

## 2014-05-24 NOTE — Assessment & Plan Note (Signed)
Persistent. She is a long term, > 30 year pack year, former smoker. Discussed Low Dose CT screening for lung CA. She would like to proceed with this- order entered. Will also check lab work today. Orders Placed This Encounter  Procedures  . MM Digital Screening  . CT CHEST LOW DOSE SCREENING W/O CM  . Flu Vaccine QUAD 36+ mos PF IM (Fluarix Quad PF)  . Tdap vaccine greater than or equal to 58yo IM  . Comprehensive metabolic panel  . CBC with Differential  . TSH  . Vitamin D, 25-hydroxy

## 2014-05-24 NOTE — Assessment & Plan Note (Addendum)
Tdap and influenza vaccines given today. Advised to consider prevnar 13- may want to check with insurance first. Mammogram ordered.

## 2014-05-24 NOTE — Progress Notes (Signed)
Pre visit review using our clinic review tool, if applicable. No additional management support is needed unless otherwise documented below in the visit note. 

## 2014-05-24 NOTE — Patient Instructions (Addendum)
Great to see you. Please call [place] to schedule your mammogram. We will call you with your lab results and your appointment for a CT if your insurance approves this.

## 2014-05-25 ENCOUNTER — Encounter: Payer: Self-pay | Admitting: *Deleted

## 2014-06-13 ENCOUNTER — Other Ambulatory Visit: Payer: Self-pay

## 2014-06-13 NOTE — Telephone Encounter (Signed)
Note from 10/2013 reviewed, Dr. Deborra Medina stated that she would give only 1 RX for ambien for short term use. Will defer to Dr. Deborra Medina for refill

## 2014-06-13 NOTE — Telephone Encounter (Signed)
Pt request refill zolpidem to [facility] [address]. Pt called pharmacy for refill but no response; pt seen 05/24/14.Please advise.

## 2014-06-18 MED ORDER — ZOLPIDEM TARTRATE 10 MG PO TABS: 10.0000 mg | ORAL_TABLET | Freq: Every evening | ORAL | Status: DC | PRN

## 2014-06-18 NOTE — Telephone Encounter (Signed)
Rx called in to requested pharmacy 

## 2014-07-04 ENCOUNTER — Other Ambulatory Visit: Payer: Self-pay | Admitting: Internal Medicine

## 2014-07-26 ENCOUNTER — Other Ambulatory Visit: Payer: Self-pay

## 2014-07-26 MED ORDER — ZOLPIDEM TARTRATE 10 MG PO TABS: 10.0000 mg | ORAL_TABLET | Freq: Every evening | ORAL | Status: DC | PRN

## 2014-07-26 NOTE — Telephone Encounter (Signed)
plz phone in. 

## 2014-07-26 NOTE — Telephone Encounter (Signed)
Pt left v/m requesting refill ambien to [facility] [address]. Please advise. Pt had f/u appt on 05/24/14.

## 2014-07-26 NOTE — Telephone Encounter (Signed)
Rx called in as directed.   

## 2014-08-01 NOTE — Addendum Note (Signed)
Addended by: Modena Nunnery on: 08/01/2014 04:18 PM   Modules accepted: Orders

## 2014-08-23 ENCOUNTER — Other Ambulatory Visit: Payer: Self-pay | Admitting: Family Medicine

## 2014-08-27 ENCOUNTER — Other Ambulatory Visit: Payer: Self-pay | Admitting: Family Medicine

## 2014-08-28 ENCOUNTER — Other Ambulatory Visit: Payer: Self-pay | Admitting: Family Medicine

## 2014-08-29 ENCOUNTER — Other Ambulatory Visit: Payer: Self-pay | Admitting: *Deleted

## 2014-08-29 MED ORDER — ZOLPIDEM TARTRATE 10 MG PO TABS: 10.0000 mg | ORAL_TABLET | Freq: Every evening | ORAL | Status: DC | PRN

## 2014-08-29 NOTE — Telephone Encounter (Signed)
Pt left v/m that [facility] has been requesting refills of zolpidem but being denied. Spoke with Colletta Maryland at [facility] and did not receive fax on 08/23/14 for zolpidem. Medication phoned to Colletta Maryland at [address] pharmacy as instructed.per DPR left detailed v/m that rx called to [facility] [address].

## 2014-08-29 NOTE — Addendum Note (Signed)
Addended by: Helene Shoe on: 08/29/2014 09:15 AM   Modules accepted: Orders

## 2014-09-18 ENCOUNTER — Encounter: Payer: Self-pay | Admitting: Family Medicine

## 2014-09-18 ENCOUNTER — Ambulatory Visit (INDEPENDENT_AMBULATORY_CARE_PROVIDER_SITE_OTHER): Payer: 59 | Admitting: Family Medicine

## 2014-09-18 VITALS — BP 136/76 | HR 106 | Temp 98.2°F | Resp 18 | Wt 163.1 lb

## 2014-09-18 DIAGNOSIS — M461 Sacroiliitis, not elsewhere classified: Secondary | ICD-10-CM | POA: Diagnosis not present

## 2014-09-18 MED ORDER — PREDNISONE 20 MG PO TABS
ORAL_TABLET | ORAL | Status: DC
Start: 2014-09-18 — End: 2015-01-14

## 2014-09-18 NOTE — Progress Notes (Signed)
Subjective:   Patient ID: Sarah Weeks, female    DOB: 11-21-1955, 59 y.o.   MRN: [id number]  Sarah Weeks is a pleasant 59 y.o. year old female who presents to clinic today with Sciatica  on 09/18/2014  HPI:  Here for 5 days of right buttocks pain that radiates down her right leg.  No known injury but she had to house sit multiple dogs and walk them- many were pulling.  No urinary symptoms.  No back pain.  Pain is worse after sitting for long periods of time.  Current Outpatient Prescriptions on File Prior to Visit  Medication Sig Dispense Refill  . Biotin 7500 MCG TABS Take 1 tablet by mouth daily.    . Cholecalciferol (VITAMIN D3) 1000 UNITS CAPS Take 1 capsule by mouth.    . Multiple Vitamin (MULTIVITAMIN) tablet Take 2 tablets by mouth daily.    Marland Kitchen zolpidem (AMBIEN) 10 MG tablet Take 1 tablet (10 mg total) by mouth at bedtime as needed. 30 tablet 0   No current facility-administered medications on file prior to visit.    Allergies  Allergen Reactions  . Hctz [Hydrochlorothiazide]     Blurred vision     Past Medical History  Diagnosis Date  . Arthritis   . Cancer 2002    breast cancer  . Hay fever   . Hypertension     Past Surgical History  Procedure Laterality Date  . Breast lumpectomy  2002    L side  . Ankle fusion  2000    Family History  Problem Relation Age of Onset  . Cancer Mother   . Alcohol abuse Father   . Heart disease Father   . Hypertension Father   . Cancer Sister   . Heart disease Sister   . Heart disease Brother   . Stroke Maternal Grandfather   . Diabetes Maternal Grandfather     History   Social History  . Marital Status: Divorced    Spouse Name: N/A  . Number of Children: N/A  . Years of Education: N/A   Occupational History  . Not on file.   Social History Main Topics  . Smoking status: Former Research scientist (life sciences)  . Smokeless tobacco: Never Used  . Alcohol Use: Yes     Comment: occasional  . Drug Use: No  . Sexual  Activity: No   Other Topics Concern  . Not on file   Social History Narrative   The PMH, PSH, Social History, Family History, Medications, and allergies have been reviewed in [facility], and have been updated if relevant.   Review of Systems  Constitutional: Negative.   Cardiovascular: Negative.   Gastrointestinal: Negative.   Genitourinary: Negative.  Negative for difficulty urinating.  Musculoskeletal: Negative for gait problem.  Skin: Negative.   Neurological: Negative.   Hematological: Negative.   Psychiatric/Behavioral: Negative.   All other systems reviewed and are negative.      Objective:    BP 136/76 mmHg  Pulse 106  Temp(Src) 98.2 F (36.8 C) (Oral)  Resp 18  Wt 163 lb 1.9 oz (73.991 kg)  SpO2 97%   Physical Exam  Constitutional: She appears well-developed and well-nourished. No distress.  HENT:  Head: Normocephalic.  Eyes: Conjunctivae are normal.  Neck: Normal range of motion.  Cardiovascular: Normal rate.   Pulmonary/Chest: Effort normal.  Musculoskeletal:  Points to right buttocks when asked where she is hurting No TTP over spine SLR neg bilaterally Fabers pos right, neg left  Skin: Skin  is warm and dry.  Psychiatric: She has a normal mood and affect. Her behavior is normal. Judgment and thought content normal.  Nursing note and vitals reviewed.         Assessment & Plan:   SI (sacroiliac) joint inflammation No Follow-up on file.

## 2014-09-18 NOTE — Patient Instructions (Signed)
Good to see you. Take prednisone as directed- with food and in the morning.  Please do exercises as directed.

## 2014-09-18 NOTE — Assessment & Plan Note (Signed)
New-  Given handout from sport med advisor- discussed exercises listed. Prednisone taper- discussed how to take this- with food, in the morning and NOT with any NSAIDS. Call or return to clinic prn if these symptoms worsen or fail to improve as anticipated. The patient indicates understanding of these issues and agrees with the plan.

## 2014-09-21 ENCOUNTER — Telehealth: Payer: Self-pay

## 2014-09-21 NOTE — Telephone Encounter (Signed)
I am sorry to hear this and yes, I would have hoped it would have started working by now.  If it is worse, she needs to be seen and have imaging done.  If very severe, go to weekend clinic or UC otherwise I can see her on Monday.

## 2014-09-21 NOTE — Telephone Encounter (Signed)
Will route to PCP for input

## 2014-09-21 NOTE — Telephone Encounter (Signed)
Noted! Thank you

## 2014-09-21 NOTE — Telephone Encounter (Signed)
Pt left v/m; pt seen 09/18/14 with sciatic nerve problem; third day of pt taking prednisone as instructed and pain has worsened rather than getting better. Pt wants to know how many days before prednisone starts to work or what else can be done; pt walks all day at work. Pt request cb.[facility] [address].

## 2014-09-21 NOTE — Telephone Encounter (Signed)
Spoke to Sarah Weeks and advised per Dr Deborra Medina. Sarah Weeks states that she was advised that she may need to see a chiropractor first, which Sarah Weeks is wanting to do. Sarah Weeks states that if she does not have any improvement after seeing chiro, she will contact office and schedule OV for additional imaging.

## 2014-10-02 ENCOUNTER — Other Ambulatory Visit: Payer: Self-pay | Admitting: *Deleted

## 2014-10-02 MED ORDER — ZOLPIDEM TARTRATE 10 MG PO TABS: 10.0000 mg | ORAL_TABLET | Freq: Every evening | ORAL | Status: DC | PRN

## 2014-10-02 NOTE — Telephone Encounter (Signed)
Rx called in to requested pharmacy 

## 2014-10-02 NOTE — Telephone Encounter (Signed)
Last f/u appt 10/2013

## 2014-10-02 NOTE — Telephone Encounter (Signed)
Pt left v/m requesting cb about status of ambien refill.

## 2014-10-30 ENCOUNTER — Other Ambulatory Visit: Payer: Self-pay | Admitting: *Deleted

## 2014-10-30 MED ORDER — ZOLPIDEM TARTRATE 10 MG PO TABS: 10.0000 mg | ORAL_TABLET | Freq: Every evening | ORAL | Status: DC | PRN

## 2014-10-30 NOTE — Telephone Encounter (Signed)
Last f/u appt 10/2014

## 2014-10-31 NOTE — Telephone Encounter (Signed)
Rx called in to requested pharmacy 

## 2014-12-03 ENCOUNTER — Encounter: Payer: Self-pay | Admitting: Family Medicine

## 2014-12-03 ENCOUNTER — Ambulatory Visit (INDEPENDENT_AMBULATORY_CARE_PROVIDER_SITE_OTHER): Payer: 59 | Admitting: Family Medicine

## 2014-12-03 VITALS — BP 134/88 | HR 93 | Temp 98.3°F | Wt 160.5 lb

## 2014-12-03 DIAGNOSIS — L659 Nonscarring hair loss, unspecified: Secondary | ICD-10-CM | POA: Diagnosis not present

## 2014-12-03 DIAGNOSIS — E785 Hyperlipidemia, unspecified: Secondary | ICD-10-CM

## 2014-12-03 MED ORDER — ZOLPIDEM TARTRATE 10 MG PO TABS: 10.0000 mg | ORAL_TABLET | Freq: Every evening | ORAL | Status: DC | PRN

## 2014-12-03 NOTE — Progress Notes (Signed)
Subjective:   Patient ID: Sarah Weeks, female    DOB: 1955/08/17, 59 y.o.   MRN: [id number]  Sarah Weeks is a pleasant 59 y.o. year old female who presents to clinic today with Alopecia  on 12/03/2014  HPI: Past few months, noticing that her hair is thinning.  She does not feel it is coming out in clumps but it is quite noticeable. No changes in her diet or rxs. No pain in her scalp.  Takes biotin daily.   Lab Results  Component Value Date   TSH 0.79 05/24/2014  No results found for: [id number]  Current Outpatient Prescriptions on File Prior to Visit  Medication Sig Dispense Refill  . Biotin 7500 MCG TABS Take 1 tablet by mouth daily.    . Cholecalciferol (VITAMIN D3) 1000 UNITS CAPS Take 1 capsule by mouth.    . Liniments (SALONPAS ARTHRITIS PAIN RELIEF) PADS Apply 1 each topically.    . Multiple Vitamin (MULTIVITAMIN) tablet Take 2 tablets by mouth daily.    . predniSONE (DELTASONE) 20 MG tablet 2 tabs by mouth x 7 days, then 1 tab by mouth x 4 days, then 1/2 tab by mouth x 3 days and stop 21 tablet 0   No current facility-administered medications on file prior to visit.    Allergies  Allergen Reactions  . Hctz [Hydrochlorothiazide]     Blurred vision     Past Medical History  Diagnosis Date  . Arthritis   . Cancer 2002    breast cancer  . Hay fever   . Hypertension     Past Surgical History  Procedure Laterality Date  . Breast lumpectomy  2002    L side  . Ankle fusion  2000    Family History  Problem Relation Age of Onset  . Cancer Mother   . Alcohol abuse Father   . Heart disease Father   . Hypertension Father   . Cancer Sister   . Heart disease Sister   . Heart disease Brother   . Stroke Maternal Grandfather   . Diabetes Maternal Grandfather     History   Social History  . Marital Status: Divorced    Spouse Name: N/A  . Number of Children: N/A  . Years of Education: N/A   Occupational History  . Not on file.   Social  History Main Topics  . Smoking status: Former Research scientist (life sciences)  . Smokeless tobacco: Never Used  . Alcohol Use: Yes     Comment: occasional  . Drug Use: No  . Sexual Activity: No   Other Topics Concern  . Not on file   Social History Narrative   The PMH, PSH, Social History, Family History, Medications, and allergies have been reviewed in [facility], and have been updated if relevant.  Review of Systems  Constitutional: Negative.   Eyes: Negative.   Respiratory: Negative.   Cardiovascular: Negative.   Endocrine: Negative.   Genitourinary: Negative.   Musculoskeletal: Negative.   Allergic/Immunologic: Negative.   Neurological: Negative.   Hematological: Negative.   Psychiatric/Behavioral: Negative.        Objective:    BP 134/88 mmHg  Pulse 93  Temp(Src) 98.3 F (36.8 C) (Oral)  Wt 160 lb 8 oz (72.802 kg)  SpO2 96%   Physical Exam  Constitutional: She is oriented to person, place, and time. She appears well-developed and well-nourished. No distress.  HENT:  Head: Normocephalic.    Eyes: Conjunctivae are normal.  Neck: Normal range of motion.  Cardiovascular: Normal rate.   Pulmonary/Chest: Effort normal.  Neurological: She is alert and oriented to person, place, and time. No cranial nerve deficit.  Skin: Skin is warm and dry.  Psychiatric: Her behavior is normal. Judgment and thought content normal.  Nursing note and vitals reviewed.         Assessment & Plan:   Alopecia No Follow-up on file.

## 2014-12-03 NOTE — Addendum Note (Signed)
Addended by: Lucille Passy on: 12/03/2014 03:22 PM   Modules accepted: Orders

## 2014-12-03 NOTE — Assessment & Plan Note (Signed)
New- Discussed possible causes and treatments for alopecia. Start out with labs today to rule out some possible contributing factors. If normal, consider adding Vit B12 supplement and topical rogaine or referral to dermatology. The patient indicates understanding of these issues and agrees with the plan.  Orders Placed This Encounter  Procedures  . Vitamin B12  . TSH  . T4, Free  . CBC with Differential/Platelet  . Comprehensive metabolic panel

## 2014-12-04 LAB — COMPREHENSIVE METABOLIC PANEL
ALT: 22 U/L (ref 0–35)
AST: 23 U/L (ref 0–37)
Albumin: 4.2 g/dL (ref 3.5–5.2)
Alkaline Phosphatase: 106 U/L (ref 39–117)
BUN: 25 mg/dL — ABNORMAL HIGH (ref 6–23)
CHLORIDE: 106 meq/L (ref 96–112)
CO2: 25 meq/L (ref 19–32)
Calcium: 9.7 mg/dL (ref 8.4–10.5)
Creatinine, Ser: 0.92 mg/dL (ref 0.40–1.20)
GFR: 66.52 mL/min (ref >60.00)
GLUCOSE: 88 mg/dL (ref 70–99)
POTASSIUM: 4.1 meq/L (ref 3.5–5.1)
SODIUM: 140 meq/L (ref 135–145)
Total Bilirubin: 0.4 mg/dL (ref 0.2–1.2)
Total Protein: 7 g/dL (ref 6.0–8.3)

## 2014-12-04 LAB — LIPID PANEL
CHOLESTEROL: 229 mg/dL — AB (ref 0–200)
HDL: 78.9 mg/dL (ref >39.00)
LDL Cholesterol: 131 mg/dL — ABNORMAL HIGH (ref 0–99)
NonHDL: 150.1
Total CHOL/HDL Ratio: 3
Triglycerides: 98 mg/dL (ref 0.0–149.0)
VLDL: 19.6 mg/dL (ref 0.0–40.0)

## 2014-12-04 LAB — CBC WITH DIFFERENTIAL/PLATELET
BASOS ABS: 0 10*3/uL (ref 0.0–0.1)
BASOS PCT: 0.4 % (ref 0.0–3.0)
Eosinophils Absolute: 0.1 10*3/uL (ref 0.0–0.7)
Eosinophils Relative: 1.1 % (ref 0.0–5.0)
HCT: 37 % (ref 36.0–46.0)
Hemoglobin: 12.5 g/dL (ref 12.0–15.0)
LYMPHS PCT: 26.1 % (ref 12.0–46.0)
Lymphs Abs: 1.6 10*3/uL (ref 0.7–4.0)
MCHC: 33.6 g/dL (ref 30.0–36.0)
MCV: 98.1 fl (ref 78.0–100.0)
Monocytes Absolute: 0.6 10*3/uL (ref 0.1–1.0)
Monocytes Relative: 9.2 % (ref 3.0–12.0)
Neutro Abs: 3.9 10*3/uL (ref 1.4–7.7)
Neutrophils Relative %: 63.2 % (ref 43.0–77.0)
Platelets: 286 10*3/uL (ref 150.0–400.0)
RBC: 3.78 Mil/uL — AB (ref 3.87–5.11)
RDW: 13.3 % (ref 11.5–15.5)
WBC: 6.2 10*3/uL (ref 4.0–10.5)

## 2014-12-04 LAB — VITAMIN B12: Vitamin B-12: 196 pg/mL — ABNORMAL LOW (ref 211–911)

## 2014-12-04 LAB — TSH: TSH: 0.6 u[IU]/mL (ref 0.35–4.50)

## 2014-12-04 LAB — T4, FREE: Free T4: 0.76 ng/dL (ref 0.60–1.60)

## 2014-12-05 ENCOUNTER — Telehealth: Payer: Self-pay

## 2014-12-05 NOTE — Telephone Encounter (Signed)
Pt left v/m requesting cb when 12/03/14 lab results are available.

## 2014-12-06 NOTE — Telephone Encounter (Signed)
See result note.  

## 2014-12-10 NOTE — Telephone Encounter (Signed)
Pt called wanting to get her lab results  Pt upset this is the 4th time she has called with no call back Best number to call [phone]

## 2014-12-10 NOTE — Telephone Encounter (Signed)
See results note. Advised pt that both I and the lab attempted to contact her. Her lab appt was cancelled as she recently completed them.

## 2014-12-13 ENCOUNTER — Other Ambulatory Visit: Payer: 59

## 2015-01-04 ENCOUNTER — Other Ambulatory Visit: Payer: Self-pay | Admitting: *Deleted

## 2015-01-04 MED ORDER — ZOLPIDEM TARTRATE 10 MG PO TABS: 10.0000 mg | ORAL_TABLET | Freq: Every evening | ORAL | Status: DC | PRN

## 2015-01-04 NOTE — Telephone Encounter (Signed)
Rx called in to requested pharmacy 

## 2015-01-04 NOTE — Telephone Encounter (Signed)
Last f/u appt 05/2014 

## 2015-01-11 ENCOUNTER — Encounter: Payer: Self-pay | Admitting: *Deleted

## 2015-01-14 ENCOUNTER — Encounter: Payer: Self-pay | Admitting: Family Medicine

## 2015-01-14 ENCOUNTER — Ambulatory Visit (INDEPENDENT_AMBULATORY_CARE_PROVIDER_SITE_OTHER): Payer: 59 | Admitting: Family Medicine

## 2015-01-14 VITALS — BP 116/70 | HR 91 | Temp 98.6°F | Wt 159.0 lb

## 2015-01-14 DIAGNOSIS — L659 Nonscarring hair loss, unspecified: Secondary | ICD-10-CM

## 2015-01-14 DIAGNOSIS — E538 Deficiency of other specified B group vitamins: Secondary | ICD-10-CM | POA: Insufficient documentation

## 2015-01-14 NOTE — Assessment & Plan Note (Signed)
Deteriorated. Discussed tx options. She would like referral to dermatology- referral placed.

## 2015-01-14 NOTE — Progress Notes (Signed)
Pre visit review using our clinic review tool, if applicable. No additional management support is needed unless otherwise documented below in the visit note. 

## 2015-01-14 NOTE — Progress Notes (Signed)
Subjective:   Patient ID: Sarah Weeks, female    DOB: [birth year], 59 y.o.   MRN: [id number]  Sarah Weeks is a pleasant 59 y.o. year old female who presents to clinic today with Alopecia  on 01/14/2015  HPI:  Saw her on 6/20 for alopecia- note reviewed. Discussed topical rogaine and referral to dermatology as well as checking labs, including b12. B12 low- she opted for OTC daily supplement rather than B12 injections and deferred referral and topical OTC rogaine rx.  Lab Results  Component Value Date   VITAMINB12 196* 12/03/2014   Lab Results  Component Value Date   ALT 22 12/03/2014   AST 23 12/03/2014   ALKPHOS 106 12/03/2014   BILITOT 0.4 12/03/2014   Lab Results  Component Value Date   NA 140 12/03/2014   K 4.1 12/03/2014   CL 106 12/03/2014   CO2 25 12/03/2014   Lab Results  Component Value Date   CREATININE 0.92 12/03/2014   Lab Results  Component Value Date   WBC 6.2 12/03/2014   HGB 12.5 12/03/2014   HCT 37.0 12/03/2014   MCV 98.1 12/03/2014   PLT 286.0 12/03/2014   Lab Results  Component Value Date   TSH 0.60 12/03/2014     Here today because hair loss is progressing despite taking OTC Vit B12 and biotin supplements.  Has not yet tried Rogaine or other topical OTC rx.   Current Outpatient Prescriptions on File Prior to Visit  Medication Sig Dispense Refill  . Biotin 7500 MCG TABS Take 1 tablet by mouth daily.    . Cholecalciferol (VITAMIN D3) 1000 UNITS CAPS Take 1 capsule by mouth.    . Multiple Vitamin (MULTIVITAMIN) tablet Take 2 tablets by mouth daily.    Marland Kitchen zolpidem (AMBIEN) 10 MG tablet Take 1 tablet (10 mg total) by mouth at bedtime as needed. 30 tablet 0   No current facility-administered medications on file prior to visit.    Allergies  Allergen Reactions  . Hctz [Hydrochlorothiazide]     Blurred vision     Past Medical History  Diagnosis Date  . Arthritis   . Cancer 2002    breast cancer  . Hay fever   . Hypertension      Past Surgical History  Procedure Laterality Date  . Breast lumpectomy  2002    L side  . Ankle fusion  2000    Family History  Problem Relation Age of Onset  . Cancer Mother   . Alcohol abuse Father   . Heart disease Father   . Hypertension Father   . Cancer Sister   . Heart disease Sister   . Heart disease Brother   . Stroke Maternal Grandfather   . Diabetes Maternal Grandfather     History   Social History  . Marital Status: Divorced    Spouse Name: N/A  . Number of Children: N/A  . Years of Education: N/A   Occupational History  . Not on file.   Social History Main Topics  . Smoking status: Former Research scientist (life sciences)  . Smokeless tobacco: Never Used  . Alcohol Use: Yes     Comment: occasional  . Drug Use: No  . Sexual Activity: No   Other Topics Concern  . Not on file   Social History Narrative   The PMH, PSH, Social History, Family History, Medications, and allergies have been reviewed in [facility], and have been updated if relevant.    Review of Systems  Constitutional:  Negative.   HENT: Negative.   Endocrine: Negative.   Skin: Negative for rash.  Neurological: Negative.   Psychiatric/Behavioral: Negative.   All other systems reviewed and are negative.      Objective:    BP 116/70 mmHg  Pulse 91  Temp(Src) 98.6 F (37 C) (Oral)  Wt 159 lb (72.122 kg)  SpO2 99%   Physical Exam  Constitutional: She is oriented to person, place, and time. She appears well-developed and well-nourished. No distress.  HENT:  Head: Normocephalic.  + female pattern hair loss apparent  Eyes: Conjunctivae are normal.  Neck: Normal range of motion.  Cardiovascular: Normal rate.   Pulmonary/Chest: Effort normal.  Musculoskeletal: Normal range of motion.  Neurological: She is alert and oriented to person, place, and time. No cranial nerve deficit.  Skin: Skin is warm and dry.  Psychiatric: She has a normal mood and affect. Her behavior is normal. Judgment and thought  content normal.  Nursing note and vitals reviewed.         Assessment & Plan:   Alopecia - Plan: Ambulatory referral to Dermatology  B12 deficiency - Plan: Ambulatory referral to Dermatology No Follow-up on file.

## 2015-01-14 NOTE — Patient Instructions (Signed)
Good to see you. Please [place] with names of dermatologists in your network.

## 2015-01-16 ENCOUNTER — Encounter: Payer: Self-pay | Admitting: *Deleted

## 2015-02-01 ENCOUNTER — Other Ambulatory Visit: Payer: Self-pay | Admitting: *Deleted

## 2015-02-01 MED ORDER — ZOLPIDEM TARTRATE 10 MG PO TABS: 10.0000 mg | ORAL_TABLET | Freq: Every evening | ORAL | Status: DC | PRN

## 2015-02-01 NOTE — Telephone Encounter (Signed)
Rx called in to requested pharmacy 

## 2015-02-01 NOTE — Telephone Encounter (Signed)
Last f/u appt 01/2015

## 2015-02-13 ENCOUNTER — Telehealth: Payer: Self-pay

## 2015-02-13 NOTE — Telephone Encounter (Signed)
I called patient to remind her of being due for a mammogram. Patient states that she will schedule a mammogram at [facility]

## 2015-02-20 ENCOUNTER — Ambulatory Visit
Admission: RE | Admit: 2015-02-20 | Discharge: 2015-02-20 | Disposition: A | Discharge status: Home / Self Care | Payer: 59 | Source: Ambulatory Visit | Attending: Family Medicine | Admitting: Family Medicine

## 2015-02-20 DIAGNOSIS — Z1239 Encounter for other screening for malignant neoplasm of breast: Secondary | ICD-10-CM

## 2015-02-20 DIAGNOSIS — Z1231 Encounter for screening mammogram for malignant neoplasm of breast: Secondary | ICD-10-CM | POA: Diagnosis present

## 2015-02-20 HISTORY — DX: Personal history of irradiation: Z92.3

## 2015-02-20 HISTORY — DX: Personal history of antineoplastic chemotherapy: Z92.21

## 2015-03-19 ENCOUNTER — Other Ambulatory Visit: Payer: Self-pay | Admitting: *Deleted

## 2015-03-19 MED ORDER — ZOLPIDEM TARTRATE 10 MG PO TABS: 10.0000 mg | ORAL_TABLET | Freq: Every evening | ORAL | Status: DC | PRN

## 2015-03-19 NOTE — Telephone Encounter (Signed)
Spoke to pt and advised that f/u appt required. States she will contact office back to schedule after viewing schedule. Pharmacy line busy to call in Rx

## 2015-03-19 NOTE — Telephone Encounter (Signed)
Insomnia last discussed 10/2013

## 2015-03-19 NOTE — Telephone Encounter (Signed)
Ok to refill but needs OV for further refills.

## 2015-03-19 NOTE — Telephone Encounter (Signed)
Rx called in to requested pharmacy 

## 2015-04-02 ENCOUNTER — Encounter: Payer: Self-pay | Admitting: Family Medicine

## 2015-04-02 ENCOUNTER — Ambulatory Visit (INDEPENDENT_AMBULATORY_CARE_PROVIDER_SITE_OTHER)
Admission: RE | Admit: 2015-04-02 | Discharge: 2015-04-02 | Disposition: A | Discharge status: Home / Self Care | Payer: 59 | Source: Ambulatory Visit | Attending: Family Medicine | Admitting: Family Medicine

## 2015-04-02 ENCOUNTER — Ambulatory Visit (INDEPENDENT_AMBULATORY_CARE_PROVIDER_SITE_OTHER): Payer: 59 | Admitting: Family Medicine

## 2015-04-02 VITALS — BP 130/68 | HR 79 | Temp 97.9°F | Wt 164.0 lb

## 2015-04-02 DIAGNOSIS — R0602 Shortness of breath: Secondary | ICD-10-CM

## 2015-04-02 DIAGNOSIS — Z23 Encounter for immunization: Secondary | ICD-10-CM

## 2015-04-02 MED ORDER — ZOLPIDEM TARTRATE 10 MG PO TABS: 10.0000 mg | ORAL_TABLET | Freq: Every evening | ORAL | Status: DC | PRN

## 2015-04-02 MED ORDER — ALBUTEROL SULFATE HFA 108 (90 BASE) MCG/ACT IN AERS: 2.0000 | INHALATION_SPRAY | Freq: Four times a day (QID) | RESPIRATORY_TRACT | Status: DC | PRN

## 2015-04-02 NOTE — Progress Notes (Signed)
Subjective:   Patient ID: [name], female    DOB: 1956/02/01, 59 y.o.   MRN: [id number]  Sarah Weeks is a pleasant 59 y.o. year old female who presents to clinic today with Breathing Problem  on 04/02/2015  HPI:  Shortness of breath/wheezing when its cold outside- has noticed it for a couple of years now.  Thought it would stop once she quit smoking two years ago but it hasn't.  No cough.  No fevers or chills.  No hemoptysis or weight loss.  Current Outpatient Prescriptions on File Prior to Visit  Medication Sig Dispense Refill  . B Complex Vitamins (B COMPLEX PO) Take by mouth. Contains 121mg of B12    . Biotin 7500 MCG TABS Take 1 tablet by mouth daily.    . Calcium Carbonate-Vit D-Min (CALTRATE PLUS PO) Take by mouth.    . Cholecalciferol (VITAMIN D3) 1000 UNITS CAPS Take 1 capsule by mouth.    . Multiple Vitamin (MULTIVITAMIN) tablet Take 2 tablets by mouth daily.     No current facility-administered medications on file prior to visit.    Allergies  Allergen Reactions  . Hctz [Hydrochlorothiazide]     Blurred vision     Past Medical History  Diagnosis Date  . Arthritis   . Hay fever   . Hypertension   . Cancer ([facility] 2002    breast cancer left  . S/P chemotherapy, time since greater than 12 weeks     left 2003  . S/P radiation therapy 2003    left breast cancer    Past Surgical History  Procedure Laterality Date  . Breast lumpectomy  2002    L side  . Ankle fusion  2000  . Breast excisional biopsy Left     positive 2003    Family History  Problem Relation Age of Onset  . Cancer Mother   . Breast cancer Mother 635 . Alcohol abuse Father   . Heart disease Father   . Hypertension Father   . Cancer Sister   . Heart disease Sister   . Breast cancer Sister 590 . Heart disease Brother   . Stroke Maternal Grandfather   . Diabetes Maternal Grandfather     Social History   Social History  . Marital Status: Divorced    Spouse Name:  N/A  . Number of Children: N/A  . Years of Education: N/A   Occupational History  . Not on file.   Social History Main Topics  . Smoking status: Former SResearch scientist (life sciences) . Smokeless tobacco: Never Used  . Alcohol Use: Yes     Comment: occasional  . Drug Use: No  . Sexual Activity: No   Other Topics Concern  . Not on file   Social History Narrative   The PMH, PSH, Social History, Family History, Medications, and allergies [facility] and have been updated if relevant.   Review of Systems  Constitutional: Negative.   Respiratory: Positive for shortness of breath and wheezing. Negative for cough and choking.   Cardiovascular: Negative.   Genitourinary: Negative.   Musculoskeletal: Negative.   Skin: Negative.   Neurological: Negative.   All other systems reviewed and are negative.      Objective:    BP 130/68 mmHg  Pulse 79  Temp(Src) 97.9 F (36.6 C) (Oral)  Wt 164 lb (74.39 kg)  SpO2 99%   Physical Exam  Constitutional: She is oriented to person, place, and time. She appears  well-developed and well-nourished. No distress.  HENT:  Head: Normocephalic.  Eyes: Conjunctivae are normal.  Neck: Normal range of motion.  Cardiovascular: Normal rate, regular rhythm and normal heart sounds.   Pulmonary/Chest: Effort normal and breath sounds normal. No respiratory distress. She has no wheezes. She has no rales.  Musculoskeletal: Normal range of motion. She exhibits no edema.  Neurological: She is alert and oriented to person, place, and time. No cranial nerve deficit.  Skin: Skin is warm and dry.  Psychiatric: She has a normal mood and affect. Her behavior is normal. Judgment and thought content normal.  Nursing note and vitals reviewed.         Assessment & Plan:   Shortness of breath No Follow-up on file.

## 2015-04-02 NOTE — Assessment & Plan Note (Signed)
Intermittent- only during cold weather. Lungs normal today on auscultation but it has been in the 80s this week. eRx sent for albuterol to use as needed for shortness of breath and wheezing. CXR today for further evaluation.   The patient indicates understanding of these issues and agrees with the plan.

## 2015-04-02 NOTE — Patient Instructions (Signed)
Great to see you. We will call you with your xray results.  Use proair as needed- every 4 to 6 hours as needed for wheezing/shortness of breath.

## 2015-04-02 NOTE — Addendum Note (Signed)
Addended by: Cloyd Stagers B on: 04/02/2015 09:26 AM   Modules accepted: Orders

## 2015-05-06 ENCOUNTER — Other Ambulatory Visit: Payer: Self-pay

## 2015-05-06 MED ORDER — ZOLPIDEM TARTRATE 10 MG PO TABS: 10.0000 mg | ORAL_TABLET | Freq: Every evening | ORAL | Status: DC | PRN

## 2015-05-06 NOTE — Telephone Encounter (Signed)
Pharmacy line busy.

## 2015-05-06 NOTE — Telephone Encounter (Signed)
Pt left v/m requesting refill ambien to [facility] [address]. Last refilled # 30 on 04/02/15. Last seen acute visit 04/02/15.Please advise.

## 2015-05-07 NOTE — Telephone Encounter (Signed)
Rx called in to requested pharmacy 

## 2015-05-16 ENCOUNTER — Ambulatory Visit (INDEPENDENT_AMBULATORY_CARE_PROVIDER_SITE_OTHER): Payer: 59 | Admitting: *Deleted

## 2015-05-16 DIAGNOSIS — Z23 Encounter for immunization: Secondary | ICD-10-CM | POA: Diagnosis not present

## 2015-07-08 ENCOUNTER — Other Ambulatory Visit: Payer: Self-pay

## 2015-07-08 MED ORDER — ZOLPIDEM TARTRATE 10 MG PO TABS: 10.0000 mg | ORAL_TABLET | Freq: Every evening | ORAL | Status: DC | PRN

## 2015-07-08 NOTE — Telephone Encounter (Signed)
Rx called in to requested pharmacy. LM on pts vm informing her OV required for additional refills. Pt has not had f/u 05/2014

## 2015-07-08 NOTE — Telephone Encounter (Signed)
[facility] [address] called to get quantity of zolpidem; advised # 30 x 0 refill. [facility] voiced understanding.

## 2015-07-08 NOTE — Telephone Encounter (Signed)
Pt left v/m requesting refill ambien to [facility] [address]. Last refill # 30 on 05/06/15. Last f/u appt 05/24/14 and last acute visit 04/02/15. No future appt scheduled.

## 2015-08-06 ENCOUNTER — Other Ambulatory Visit: Payer: Self-pay

## 2015-08-06 MED ORDER — ZOLPIDEM TARTRATE 10 MG PO TABS: 10.0000 mg | ORAL_TABLET | Freq: Every evening | ORAL | Status: DC | PRN

## 2015-08-06 NOTE — Telephone Encounter (Signed)
Pt left v/m requesting refill ambien to [facility] [address] (pt changed pharmacy due to ins coverage requirements). rx last refilled # 30 on 07/08/15;last acute visit 04/02/15; last f/u visit 05/24/14. No future appt scheduled.

## 2015-08-06 NOTE — Telephone Encounter (Signed)
Rx called in to requested pharmacy. LM on pts vm informing her OV required for additional refills. Pt has not had f/u visit since 05/2014

## 2015-09-05 ENCOUNTER — Encounter: Payer: Self-pay | Admitting: Family Medicine

## 2015-09-05 ENCOUNTER — Ambulatory Visit (INDEPENDENT_AMBULATORY_CARE_PROVIDER_SITE_OTHER): Payer: BLUE CROSS/BLUE SHIELD | Admitting: Family Medicine

## 2015-09-05 VITALS — BP 118/60 | HR 102 | Temp 98.0°F | Wt 168.2 lb

## 2015-09-05 DIAGNOSIS — G4700 Insomnia, unspecified: Secondary | ICD-10-CM

## 2015-09-05 DIAGNOSIS — L659 Nonscarring hair loss, unspecified: Secondary | ICD-10-CM

## 2015-09-05 DIAGNOSIS — H9202 Otalgia, left ear: Secondary | ICD-10-CM

## 2015-09-05 MED ORDER — ZOLPIDEM TARTRATE 10 MG PO TABS: 10.0000 mg | ORAL_TABLET | Freq: Every evening | ORAL | Status: DC | PRN

## 2015-09-05 NOTE — Patient Instructions (Signed)
Great to see you.  Use dilute hydrogen peroxide (1:1 with water) a few drops in left ear to help break down ear wax.

## 2015-09-05 NOTE — Assessment & Plan Note (Signed)
Improved with Vit B12 supplementation. No changes made today.

## 2015-09-05 NOTE — Progress Notes (Signed)
Pre visit review using our clinic review tool, if applicable. No additional management support is needed unless otherwise documented below in the visit note. 

## 2015-09-05 NOTE — Addendum Note (Signed)
Addended by: Lucille Passy on: 09/05/2015 08:28 AM   Modules accepted: Level of Service

## 2015-09-05 NOTE — Assessment & Plan Note (Signed)
Well controlled with intermittent ambien use. Rx printed and given to pt today.

## 2015-09-05 NOTE — Assessment & Plan Note (Signed)
New- likely due to some surrounding cerumen in the canal. Advised dilute hydrogen peroxide (1:1 with water) a few drops in left ear to help break down ear wax. Call or return to clinic prn if these symptoms worsen or fail to improve as anticipated. The patient indicates understanding of these issues and agrees with the plan.

## 2015-09-05 NOTE — Progress Notes (Signed)
Subjective:   Patient ID: Sarah Weeks, female    DOB: 10-04-1955, 60 y.o.   MRN: [id number]  Sarah Weeks is a pleasant 60 y.o. year old female who presents to clinic today with Follow-up; Medication Refill; and Ear Pain  on 09/05/2015  HPI:  Alopecia- much improved with Vit B12.  She is pleased with this.  Also having more energy.  Left ear- intermittent pain, pressure and itching. No URI symptoms. No redness or drainage from ear. She feels this has been ongoing for months.  Insomnia- well controlled with prn ambien. Sleeping well. Denies any symptoms of anxiety or depression.  Current Outpatient Prescriptions on File Prior to Visit  Medication Sig Dispense Refill  . albuterol (PROVENTIL HFA;VENTOLIN HFA) 108 (90 BASE) MCG/ACT inhaler Inhale 2 puffs into the lungs every 6 (six) hours as needed. 1 Inhaler 0  . B Complex Vitamins (B COMPLEX PO) Take by mouth. Contains 1267mg of B12    . Biotin 7500 MCG TABS Take 1 tablet by mouth daily.    . Calcium Carbonate-Vit D-Min (CALTRATE PLUS PO) Take by mouth.    . Cholecalciferol (VITAMIN D3) 1000 UNITS CAPS Take 1 capsule by mouth.    .Nyoka CowdenTea, Camillia sinensis, (GREEN TEA EXTRACT) 150 MG CAPS Take by mouth.    . Multiple Vitamin (MULTIVITAMIN) tablet Take 2 tablets by mouth daily.     No current facility-administered medications on file prior to visit.    Allergies  Allergen Reactions  . Hctz [Hydrochlorothiazide]     Blurred vision     Past Medical History  Diagnosis Date  . Arthritis   . Hay fever   . Hypertension   . Cancer ([facility] 2002    breast cancer left  . S/P chemotherapy, time since greater than 12 weeks     left 2003  . S/P radiation therapy 2003    left breast cancer    Past Surgical History  Procedure Laterality Date  . Breast lumpectomy  2002    L side  . Ankle fusion  2000  . Breast excisional biopsy Left     positive 2003    Family History  Problem Relation Age of Onset  . Cancer  Mother   . Breast cancer Mother 690 . Alcohol abuse Father   . Heart disease Father   . Hypertension Father   . Cancer Sister   . Heart disease Sister   . Breast cancer Sister 568 . Heart disease Brother   . Stroke Maternal Grandfather   . Diabetes Maternal Grandfather     Social History   Social History  . Marital Status: Divorced    Spouse Name: N/A  . Number of Children: N/A  . Years of Education: N/A   Occupational History  . Not on file.   Social History Main Topics  . Smoking status: Former SResearch scientist (life sciences) . Smokeless tobacco: Never Used  . Alcohol Use: Yes     Comment: occasional  . Drug Use: No  . Sexual Activity: No   Other Topics Concern  . Not on file   Social History Narrative   The PMH, PSH, Social History, Family History, Medications, and allergies [facility] and have been updated if relevant.   Review of Systems  Constitutional: Negative.   HENT: Positive for ear pain. Negative for congestion, ear discharge, mouth sores, nosebleeds, postnasal drip and rhinorrhea.   Respiratory: Negative.   Cardiovascular: Negative.   Psychiatric/Behavioral: Negative.  All other systems reviewed and are negative.      Objective:    BP 118/60 mmHg  Pulse 102  Temp(Src) 98 F (36.7 C) (Oral)  Wt 168 lb 4 oz (76.318 kg)  SpO2 92%   Physical Exam  Constitutional: She is oriented to person, place, and time. She appears well-developed and well-nourished. No distress.  HENT:  Head: Normocephalic and atraumatic.  Left TM clear- she does have some hardened cerumen around the edges of the canal. Right TM and canal normal  Eyes: Conjunctivae are normal.  Cardiovascular: Normal rate.   Pulmonary/Chest: Effort normal.  Musculoskeletal: Normal range of motion.  Neurological: She is alert and oriented to person, place, and time. No cranial nerve deficit.  Skin: Skin is warm and dry. She is not diaphoretic.  Psychiatric: She has a normal mood and affect.  Her behavior is normal. Judgment and thought content normal.  Nursing note and vitals reviewed.         Assessment & Plan:   Insomnia  Left ear pain  Alopecia No Follow-up on file.

## 2015-10-07 ENCOUNTER — Other Ambulatory Visit: Payer: Self-pay | Admitting: *Deleted

## 2015-10-07 MED ORDER — ZOLPIDEM TARTRATE 10 MG PO TABS: 10.0000 mg | ORAL_TABLET | Freq: Every evening | ORAL | Status: DC | PRN

## 2015-10-07 NOTE — Telephone Encounter (Signed)
Rx faxed to requested pharmacy 

## 2015-10-07 NOTE — Telephone Encounter (Signed)
Patient called requesting a refill on Ambien- Pharmacy [facility] Last refill 09/05/15 #30, office visit same date

## 2015-11-04 ENCOUNTER — Other Ambulatory Visit: Payer: Self-pay

## 2015-11-04 MED ORDER — ZOLPIDEM TARTRATE 10 MG PO TABS
10.0000 mg | ORAL_TABLET | Freq: Every evening | ORAL | Status: DC | PRN
Start: 2015-11-04 — End: 2015-12-09

## 2015-11-04 NOTE — Telephone Encounter (Signed)
Pt left v/m requesting refill ambien to [facility] [address] Last refilled # 30 on 10/07/15. Pt last seen 09/05/15.

## 2015-11-04 NOTE — Telephone Encounter (Signed)
Rx called in to requested pharmacy 

## 2015-11-26 ENCOUNTER — Telehealth: Payer: Self-pay | Admitting: Family Medicine

## 2015-11-26 NOTE — Telephone Encounter (Signed)
TH scheduled appt 11/27/15 with Dr Lorelei Pont.

## 2015-11-26 NOTE — Telephone Encounter (Signed)
Patient Name: Sarah Weeks  DOB: 1956/06/12    Initial Comment Caller states about 10 days ago noticed light blood in urine, stopped after 2 days; now has begun again; has only a slight weird feeling in stomach, not pain;    Nurse Assessment  Nurse: Leilani Merl, RN, Heather Date/Time (Eastern Time): 11/26/2015 2:07:13 PM  Confirm and document reason for call. If symptomatic, describe symptoms. You must click the next button to save text entered. ---Caller states about 10 days ago noticed light blood in urine, stopped after 2 days; now has begun again; has only a slight weird feeling in stomach, not pain;  Has the patient traveled out of the country within the last 30 days? ---Not Applicable  Does the patient have any new or worsening symptoms? ---Yes  Will a triage be completed? ---Yes  Related visit to physician within the last 2 weeks? ---No  Does the PT have any chronic conditions? (i.e. diabetes, asthma, etc.) ---Yes  List chronic conditions. ---See MR  Is this a behavioral health or substance abuse call? ---No     Guidelines    Guideline Title Affirmed Question Affirmed Notes  Urine - Blood In Blood in urine, but all triage questions negative (Exception: could be normal menstrual bleeding)    Final Disposition User   See Physician within 24 Hours Stand[place], RN, Water quality scientist    Comments  Appt with Dr. Lorelei Pont tomorrow at 10:15   Referrals  REFERRED TO PCP OFFICE   Disagree/Comply: Comply

## 2015-11-27 ENCOUNTER — Ambulatory Visit (INDEPENDENT_AMBULATORY_CARE_PROVIDER_SITE_OTHER): Payer: BLUE CROSS/BLUE SHIELD | Admitting: Family Medicine

## 2015-11-27 ENCOUNTER — Encounter: Payer: Self-pay | Admitting: Family Medicine

## 2015-11-27 VITALS — BP 150/90 | HR 50 | Temp 98.8°F | Ht 62.5 in | Wt 165.2 lb

## 2015-11-27 DIAGNOSIS — R319 Hematuria, unspecified: Secondary | ICD-10-CM | POA: Diagnosis not present

## 2015-11-27 DIAGNOSIS — Z87891 Personal history of nicotine dependence: Secondary | ICD-10-CM | POA: Diagnosis not present

## 2015-11-27 DIAGNOSIS — R310 Gross hematuria: Secondary | ICD-10-CM | POA: Diagnosis not present

## 2015-11-27 LAB — POC URINALSYSI DIPSTICK (AUTOMATED)
Bilirubin, UA: NEGATIVE
Glucose, UA: NEGATIVE
Ketones, UA: NEGATIVE
Leukocytes, UA: NEGATIVE
NITRITE UA: NEGATIVE
PH UA: 5.5
Spec Grav, UA: >=1.03
UROBILINOGEN UA: 0.2

## 2015-11-27 NOTE — Patient Instructions (Signed)

## 2015-11-27 NOTE — Progress Notes (Signed)
Dr. Frederico Hamman T. Avion Patella, MD, [facility] [place], [address] Phone: [phone] Fax: [phone]  11/27/2015  Patient: Sarah Weeks, MRN: [id number], DOB: 1955-09-03, 60 y.o.  Primary Physician:  Arnette Norris, MD   Chief Complaint  Patient presents with  . Hematuria   Subjective:   Sarah Weeks is a 60 y.o. very pleasant female patient who presents with the following:  Gross hematuria: The patient has been having intermittent gross hematuria over the last week. She is asymptomatic. She is not having any burning, urgency, or other urinary symptoms. She does not feel sick at all.  Past Medical History, Surgical History, Social History, Family History, Problem List, Medications, and Allergies have been reviewed and updated if relevant.  Patient Active Problem List   Diagnosis Date Noted  . Left ear pain 09/05/2015  . Shortness of breath 04/02/2015  . B12 deficiency 01/14/2015  . Alopecia 12/03/2014  . SI (sacroiliac) joint inflammation ([place]) 09/18/2014  . 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 Insomnia 10/26/2013  . Family history of early CAD 08/17/2013  . Elevated blood pressure 08/17/2013  . Anxiety state, unspecified 08/17/2013    Past Medical History  Diagnosis Date  . Arthritis   . Hay fever   . Hypertension   . Cancer [facility]) 2002    breast cancer left  . S/P chemotherapy, time since greater than 12 weeks     left 2003  . S/P radiation therapy 2003    left breast cancer    Past Surgical History  Procedure Laterality Date  . Breast lumpectomy  2002    L side  . Ankle fusion  2000  . Breast excisional biopsy Left     positive 2003    Social History   Social History  . Marital Status: Divorced    Spouse Name: N/A  . Number of Children: N/A  . Years of Education: N/A   Occupational History  . Not on file.   Social History Main Topics  . Smoking status: Former  Research scientist (life sciences)  . Smokeless tobacco: Never Used  . Alcohol Use: Yes     Comment: occasional  . Drug Use: No  . Sexual Activity: No   Other Topics Concern  . Not on file   Social History Narrative    Family History  Problem Relation Age of Onset  . Cancer Mother   . Breast cancer Mother 72  . Alcohol abuse Father   . Heart disease Father   . Hypertension Father   . Cancer Sister   . Heart disease Sister   . Breast cancer Sister 30  . Heart disease Brother   . Stroke Maternal Grandfather   . Diabetes Maternal Grandfather     Allergies  Allergen Reactions  . Hctz [Hydrochlorothiazide]     Blurred vision     Medication list reviewed and updated in full in [place].   GEN: No acute illnesses, no fevers, chills. GI: No n/v/d, eating normally Pulm: No SOB Interactive and getting along well at home.  Otherwise, ROS is as per the HPI.  Objective:   BP 150/90 mmHg  Pulse 50  Temp(Src) 98.8 F (37.1 C) (Oral)  Ht 5' 2.5" (1.588 m)  Wt 165 lb 4 oz (74.957 kg)  BMI 29.72 kg/m2  GEN: WDWN, NAD, Non-toxic, A & O x 3 HEENT: Atraumatic, Normocephalic. Neck supple. No masses, No LAD. Ears and Nose:  No external deformity. CV: RRR, No M/G/R. No JVD. No thrill. No extra heart sounds. PULM: CTA B, no wheezes, crackles, rhonchi. No retractions. No resp. distress. No accessory muscle use. EXTR: No c/c/e NEURO Normal gait.  PSYCH: Normally interactive. Conversant. Not depressed or anxious appearing.  Calm demeanor.   Laboratory and Imaging Data: Results for orders placed or performed in visit on 11/27/15  POCT Urinalysis Dipstick (Automated)  Result Value Ref Range   Color, UA yellow    Clarity, UA clear    Glucose, UA negative    Bilirubin, UA negative    Ketones, UA negative    Spec Grav, UA >=1.030    Blood, UA large    pH, UA 5.5    Protein, UA 1+    Urobilinogen, UA 0.2    Nitrite, UA negative    Leukocytes, UA Negative Negative     Assessment and Plan:     Gross hematuria  Hematuria - Plan: POCT Urinalysis Dipstick (Automated), Urine culture, Ambulatory referral to Urology  Former heavy cigarette smoker (20-39 per day)  Gross hematuria and a 60 year old. Concern for potential bladder or renal cancer. Consult urology.  Follow-up: No Follow-up on file.  Orders Placed This Encounter  Procedures  . Urine culture  . Ambulatory referral to Urology  . POCT Urinalysis Dipstick (Automated)    Signed,  Matis Monnier T. Benaiah Behan, MD   Patient's Medications  New Prescriptions   No medications on file  Previous Medications   B COMPLEX VITAMINS (B COMPLEX PO)    Take by mouth. Reported on 11/27/2015   BIOTIN 7500 MCG TABS    Take 1 tablet by mouth daily. Reported on 11/27/2015   CALCIUM CARBONATE-VIT D-MIN (CALTRATE PLUS PO)    Take by mouth. Reported on 11/27/2015   CHOLECALCIFEROL (VITAMIN D3) 1000 UNITS CAPS    Take 1 capsule by mouth. Reported on 11/27/2015   GREEN TEA, CAMILLIA SINENSIS, (GREEN TEA EXTRACT) 150 MG CAPS    Take by mouth. Reported on 11/27/2015   IBUPROFEN (ADVIL,MOTRIN) 200 MG TABLET    Take 600 mg by mouth 2 (two) times daily.   MULTIPLE VITAMIN (MULTIVITAMIN) TABLET    Take 2 tablets by mouth daily. Reported on 11/27/2015   ZOLPIDEM (AMBIEN) 10 MG TABLET    Take 1 tablet (10 mg total) by mouth at bedtime as needed.  Modified Medications   No medications on file  Discontinued Medications   ALBUTEROL (PROVENTIL HFA;VENTOLIN HFA) 108 (90 BASE) MCG/ACT INHALER    Inhale 2 puffs into the lungs every 6 (six) hours as needed.

## 2015-11-27 NOTE — Progress Notes (Signed)
Pre visit review using our clinic review tool, if applicable. No additional management support is needed unless otherwise documented below in the visit note. 

## 2015-11-28 LAB — URINE CULTURE

## 2015-12-09 ENCOUNTER — Other Ambulatory Visit: Payer: Self-pay

## 2015-12-09 MED ORDER — ZOLPIDEM TARTRATE 10 MG PO TABS: 10.0000 mg | ORAL_TABLET | Freq: Every evening | ORAL | Status: DC | PRN

## 2015-12-09 NOTE — Telephone Encounter (Signed)
rx called in to requested pharmacy 

## 2015-12-09 NOTE — Telephone Encounter (Signed)
Pt left v/m requesting refill zolpidem to [facility] [address] Last printed # 30 on 11/04/15 and last seen 09/05/15.Please advise.

## 2015-12-10 ENCOUNTER — Encounter: Payer: Self-pay | Admitting: Urology

## 2015-12-10 ENCOUNTER — Ambulatory Visit (INDEPENDENT_AMBULATORY_CARE_PROVIDER_SITE_OTHER): Payer: BLUE CROSS/BLUE SHIELD | Admitting: Urology

## 2015-12-10 VITALS — BP 154/101 | HR 92 | Ht 62.0 in | Wt 166.1 lb

## 2015-12-10 DIAGNOSIS — R310 Gross hematuria: Secondary | ICD-10-CM | POA: Diagnosis not present

## 2015-12-10 LAB — URINALYSIS, COMPLETE
Bilirubin, UA: NEGATIVE
Glucose, UA: NEGATIVE
KETONES UA: NEGATIVE
NITRITE UA: NEGATIVE
PH UA: 5.5 (ref 5.0–7.5)
SPEC GRAV UA: 1.015 (ref 1.005–1.030)
Urobilinogen, Ur: 0.2 mg/dL (ref 0.2–1.0)

## 2015-12-10 LAB — MICROSCOPIC EXAMINATION: BACTERIA UA: NONE SEEN

## 2015-12-10 NOTE — Patient Instructions (Addendum)
Hematuria, Adult Hematuria is blood in your urine. It can be caused by a bladder infection, kidney infection, prostate infection, kidney stone, or cancer of your urinary tract. Infections can usually be treated with medicine, and a kidney stone usually will pass through your urine. If neither of these is the cause of your hematuria, further workup to find out the reason may be needed. It is very important that you tell your health care provider about any blood you see in your urine, even if the blood stops without treatment or happens without causing pain. Blood in your urine that happens and then stops and then happens again can be a symptom of a very serious condition. Also, pain is not a symptom in the initial stages of many urinary cancers. HOME CARE INSTRUCTIONS   Drink lots of fluid, 3-4 quarts a day. If you have been diagnosed with an infection, cranberry juice is especially recommended, in addition to large amounts of water.  Avoid caffeine, tea, and carbonated beverages because they tend to irritate the bladder.  Avoid alcohol because it may irritate the prostate.  Take all medicines as directed by your health care provider.  If you were prescribed an antibiotic medicine, finish it all even if you start to feel better.  If you have been diagnosed with a kidney stone, follow your health care provider's instructions regarding straining your urine to catch the stone.  Empty your bladder often. Avoid holding urine for long periods of time.  After a bowel movement, women should cleanse front to back. Use each tissue only once.  Empty your bladder before and after sexual intercourse if you are a female. SEEK MEDICAL CARE IF:  You develop back pain.  You have a fever.  You have a feeling of sickness in your stomach (nausea) or vomiting.  Your symptoms are not better in 3 days. Return sooner if you are getting worse. SEEK IMMEDIATE MEDICAL CARE IF:   You develop severe vomiting and  are unable to keep the medicine down.  You develop severe back or abdominal pain despite taking your medicines.  You begin passing a large amount of blood or clots in your urine.  You feel extremely weak or faint, or you pass out. MAKE SURE YOU:   Understand these instructions.  Will watch your condition.  Will get help right away if you are not doing well or get worse.   This information is not intended to replace advice given to you by your health care provider. Make sure you discuss any questions you have with your health care provider.   Document Released: 06/01/2005 Document Revised: 06/22/2014 Document Reviewed: 01/30/2013 Elsevier Interactive Patient Education 2016 [place] Scan A computed tomography (CT) scan is a specialized X-ray scan. It uses X-rays and a computer to make pictures of different areas of your body. A CT scan can offer more detailed information than a regular X-ray exam. The CT scan provides data about internal organs, soft tissue structures, blood vessels, and bones.  The CT scanner is a large machine that takes pictures of your body as you move through the opening.  LET [facility] CARE PROVIDER KNOW ABOUT:  Any allergies you have.   All medicines you are taking, including vitamins, herbs, eye drops, creams, and over-the-counter medicines.   Previous problems you or members of your family have had with the use of anesthetics.   Any blood disorders you have.   Previous surgeries you have had.   Medical conditions  you have. RISKS AND COMPLICATIONS  Generally, this is a safe procedure. However, as with any procedure, problems can occur. Possible problems include:   An allergic reaction to the contrast material.   Development of cancer from excessive exposure to radiation. The risk of this is small.  BEFORE THE PROCEDURE   The day before the test, stop drinking caffeinated beverages. These include energy drinks, tea, soda, coffee, and  hot chocolate.   On the day of the test:  About 4 hours before the test, stop eating and drinking anything but water as advised by your health care provider.   Avoid wearing jewelry. You will have to partly or fully undress and wear a hospital gown. PROCEDURE   You will be asked to lie on a table with your arms above your head.   If contrast dye is to be used for the test, an IV tube will be inserted in your arm. The contrast dye will be injected into the IV tube. You might feel warm, or you may get a metallic taste in your mouth.   The table you will be lying on will move into a large machine that will do the scanning.   You will be able to see, hear, and talk to the person running the machine while you are in it. Follow that person's directions.   The CT machine will move around you to take pictures. Do not move while it is scanning. This helps to get a good image.   When the best possible pictures have been taken, the machine will be turned off. The table will be moved out of the machine. The IV tube will then be removed. AFTER THE PROCEDURE  Ask your health care provider when to follow up for your test results.   This information is not intended to replace advice given to you by your health care provider. Make sure you discuss any questions you have with your health care provider.   Document Released: 07/09/2004 Document Revised: 06/06/2013 Document Reviewed: 02/06/2013 Elsevier Interactive Patient Education [employer]. Cystoscopy Cystoscopy is a procedure that is used to help your caregiver diagnose and sometimes treat conditions that affect your lower urinary tract. Your lower urinary tract includes your bladder and the tube through which urine passes from your bladder out of your body (urethra). Cystoscopy is performed with a thin, tube-shaped instrument (cystoscope). The cystoscope has lenses and a light at the end so that your caregiver can see inside your bladder.  The cystoscope is inserted at the entrance of your urethra. Your caregiver guides it through your urethra and into your bladder. There are two main types of cystoscopy:  Flexible cystoscopy (with a flexible cystoscope).  Rigid cystoscopy (with a rigid cystoscope). Cystoscopy may be recommended for many conditions, including:  Urinary tract infections.  Blood in your urine (hematuria).  Loss of bladder control (urinary incontinence) or overactive bladder.  Unusual cells found in a urine sample.  Urinary blockage.  Painful urination. Cystoscopy may also be done to remove a sample of your tissue to be checked under a microscope (biopsy). It may also be done to remove or destroy bladder stones. LET YOUR CAREGIVER KNOW ABOUT:  Allergies to food or medicine.  Medicines taken, including vitamins, herbs, eyedrops, over-the-counter medicines, and creams.  Use of steroids (by mouth or creams).  Previous problems with anesthetics or numbing medicines.  History of bleeding problems or blood clots.  Previous surgery.  Other health problems, including diabetes and kidney problems.  Possibility of pregnancy, if this applies. PROCEDURE The area around the opening to your urethra will be cleaned. A medicine to numb your urethra (local anesthetic) is used. If a tissue sample or stone is removed during the procedure, you may be given a medicine to make you sleep (general anesthetic). Your caregiver will gently insert the tip of the cystoscope into your urethra. The cystoscope will be slowly glided through your urethra and into your bladder. Sterile fluid will flow through the cystoscope and into your bladder. The fluid will expand and stretch your bladder. This gives your caregiver a better view of your bladder walls. The procedure lasts about 15-20 minutes. AFTER THE PROCEDURE If a local anesthetic is used, you will be allowed to go home as soon as you are ready. If a general anesthetic is  used, you will be taken to a recovery area until you are stable. You may have temporary bleeding and burning on urination.   This information is not intended to replace advice given to you by your health care provider. Make sure you discuss any questions you have with your health care provider.   Document Released: 05/29/2000 Document Revised: 06/22/2014 Document Reviewed: 11/23/2011 Elsevier Interactive Patient Education 2016 Elsevier Inc[employer]  Take Benadryl and Zantac one hour prior to the CT scan

## 2015-12-10 NOTE — Progress Notes (Signed)
12/10/2015 3:26 PM   Sarah Weeks 11/15/1955 [id number]  Referring provider: Lucille Passy, MD [address] [place], [place] [address]  Chief Complaint  Patient presents with  . Hematuria    referred by Dr. Edilia Weeks    HPI: Patient is a 60 -year-old Caucasian female who presents today as a referral from their PCP, Dr. Deborra Weeks, for gross hematuria.    Patient states that about 1 month ago she hadn't 3 incidences of painless gross hematuria.  She does not have a prior history of recurrent urinary tract infections, nephrolithiasis, trauma to the genitourinary tract or malignancies of the genitourinary tract.   She does not have a family medical history of nephrolithiasis, malignancies of the genitourinary tract or hematuria.   Today, she is not having symptoms of frequent urination, urgency, dysuria, nocturia, incontinence, hesitancy, intermittency, straining to urinate or a weak urinary stream.  Her UA today demonstrates >30 RBC's/hpf.  She is not experiencing any suprapubic pain, abdominal pain or flank pain.  She denies any recent fevers, chills, nausea or vomiting.   She had a CT of the abdomen with contrast in 2002 which noted no abnormalities in the kidneys.  She is a former smoker, with a 40 ppd history.  Quit 3 years ago.  She did undergo chemotherapy for breast cancer in 2002.     PMH: Past Medical History  Diagnosis Date  . Arthritis   . Hay fever   . Hypertension   . Cancer [facility]) 2002    breast cancer left  . S/P chemotherapy, time since greater than 12 weeks     left 2003  . S/P radiation therapy 2003    left breast cancer  . Hematuria     gross    Surgical History: Past Surgical History  Procedure Laterality Date  . Breast lumpectomy  2002    L side  . Ankle fusion  2000  . Breast excisional biopsy Left     positive 2003    Home Medications:    Medication List       This list is accurate as of: 12/10/15  3:26 PM.  Always use your most  recent med list.               B COMPLEX PO  Take by mouth. Reported on 11/27/2015     Biotin 7500 MCG Tabs  Take 1 tablet by mouth daily. Reported on 11/27/2015     CALTRATE PLUS PO  Take by mouth. Reported on 11/27/2015     Green Tea Extract 150 MG Caps  Take by mouth. Reported on 11/27/2015     ibuprofen 200 MG tablet  Commonly known as:  ADVIL,MOTRIN  Take 600 mg by mouth 2 (two) times daily.     multivitamin tablet  Take 2 tablets by mouth daily. Reported on 11/27/2015     Vitamin D3 1000 units Caps  Take 1 capsule by mouth. Reported on 11/27/2015     zolpidem 10 MG tablet  Commonly known as:  AMBIEN  Take 1 tablet (10 mg total) by mouth at bedtime as needed.        Allergies:  Allergies  Allergen Reactions  . Hctz [Hydrochlorothiazide]     Blurred vision     Family History: Family History  Problem Relation Age of Onset  . Cancer Mother   . Breast cancer Mother 52  . Alcohol abuse Father   . Heart disease Father   . Hypertension Father   .  Cancer Sister   . Heart disease Sister   . Breast cancer Sister 35  . Heart disease Brother   . Stroke Maternal Grandfather   . Diabetes Maternal Grandfather   . Kidney cancer Neg Hx   . Kidney disease Neg Hx   . Prostate cancer Neg Hx   . Bladder Cancer Neg Hx     Social History:  reports that she has quit smoking. She has never used smokeless tobacco. She reports that she drinks alcohol. She reports that she does not use illicit drugs.  ROS: UROLOGY Frequent Urination?: No Hard to postpone urination?: No Burning/pain with urination?: No Get up at night to urinate?: No Leakage of urine?: No Urine stream starts and stops?: No Trouble starting stream?: No Do you have to strain to urinate?: No Blood in urine?: Yes Urinary tract infection?: No Sexually transmitted disease?: No Injury to kidneys or bladder?: No Painful intercourse?: No Weak stream?: No Currently pregnant?: No Vaginal bleeding?: No Last  menstrual period?: n  Gastrointestinal Nausea?: No Vomiting?: No Indigestion/heartburn?: No Diarrhea?: Yes Constipation?: No  Constitutional Fever: No Night sweats?: No Weight loss?: No Fatigue?: Yes  Skin Skin rash/lesions?: No Itching?: No  Eyes Blurred vision?: No Double vision?: No  Ears/Nose/Throat Sore throat?: No Sinus problems?: No  Hematologic/Lymphatic Swollen glands?: No Easy bruising?: No  Cardiovascular Leg swelling?: No Chest pain?: No  Respiratory Cough?: No Shortness of breath?: No  Endocrine Excessive thirst?: No  Musculoskeletal Back pain?: Yes Joint pain?: Yes  Neurological Headaches?: Yes Dizziness?: No  Psychologic Depression?: No Anxiety?: No  Physical Exam: BP 154/101 mmHg  Pulse 92  Ht '5\' 2"'$  (1.575 m)  Wt 166 lb 1.6 oz (75.342 kg)  BMI 30.37 kg/m2  Constitutional: Well nourished. Alert and oriented, No acute distress. HEENT: [place] AT, moist mucus membranes. Trachea midline, no masses. Cardiovascular: No clubbing, cyanosis, or edema. Respiratory: Normal respiratory effort, no increased work of breathing. GI: Abdomen is soft, non tender, non distended, no abdominal masses. Liver and spleen not palpable.  No hernias appreciated.  Stool sample for occult testing is not indicated.   GU: No CVA tenderness.  No bladder fullness or masses.  Normal external genitalia, normal pubic hair distribution, no lesions.  Normal urethral meatus, no lesions, no prolapse, no discharge.   No urethral masses, tenderness and/or tenderness. No bladder fullness, tenderness or masses. Normal vagina mucosa, good estrogen effect, no discharge, no lesions, good pelvic support, no cystocele or rectocele noted.  No cervical motion tenderness.  Uterus is freely mobile and non-fixed.  No adnexal/parametria masses or tenderness noted.  Anus and perineum are without rashes or lesions.    Skin: No rashes, bruises or suspicious lesions. Lymph: No cervical or  inguinal adenopathy. Neurologic: Grossly intact, no focal deficits, moving all 4 extremities. Psychiatric: Normal mood and affect.  Laboratory Data: Lab Results  Component Value Date   WBC 6.2 12/03/2014   HGB 12.5 12/03/2014   HCT 37.0 12/03/2014   MCV 98.1 12/03/2014   PLT 286.0 12/03/2014    Lab Results  Component Value Date   CREATININE 0.92 12/03/2014    Lab Results  Component Value Date   TSH 0.60 12/03/2014       Component Value Date/Time   CHOL 229* 12/03/2014 1516   HDL 78.90 12/03/2014 1516   CHOLHDL 3 12/03/2014 1516   VLDL 19.6 12/03/2014 1516   LDLCALC 131* 12/03/2014 1516    Lab Results  Component Value Date   AST 23 12/03/2014  Lab Results  Component Value Date   ALT 22 12/03/2014    Urinalysis Results for orders placed or performed in visit on 12/10/15  CULTURE, URINE COMPREHENSIVE  Result Value Ref Range   Urine Culture, Comprehensive Final report    Result 1 Comment   Microscopic Examination  Result Value Ref Range   WBC, UA 6-10 (A) 0 -  5 /hpf   RBC, UA >30 (A) 0 -  2 /hpf   Epithelial Cells (non renal) 0-10 0 - 10 /hpf   Bacteria, UA None seen None seen/Few  Urinalysis, Complete  Result Value Ref Range   Specific Gravity, UA 1.015 1.005 - 1.030   pH, UA 5.5 5.0 - 7.5   Color, UA Yellow Yellow   Appearance Ur Hazy (A) Clear   Leukocytes, UA 1+ (A) Negative   Protein, UA Trace (A) Negative/Trace   Glucose, UA Negative Negative   Ketones, UA Negative Negative   RBC, UA 2+ (A) Negative   Bilirubin, UA Negative Negative   Urobilinogen, Ur 0.2 0.2 - 1.0 mg/dL   Nitrite, UA Negative Negative   Microscopic Examination See below:   BUN+Creat  Result Value Ref Range   BUN 20 6 - 24 mg/dL   Creatinine, Ser 0.78 0.57 - 1.00 mg/dL   GFR calc non Af Amer 83 >59 mL/min/1.73   GFR calc Af Amer 96 >59 mL/min/1.73   BUN/Creatinine Ratio 26 (H) 9 - 23    Pertinent Imaging: FINDINGS CLINICAL INFORMATION: 60 YEAR OLD WITH HISTORY OF  LEFT BREAST CANCER, STATUS POST LUMPECTOMY. PATIENT RECENTLY COMPLETED CHEMOTHERAPY. RESTAGING. CT OF THE CHEST AND ABDOMEN, 03/23/01: TECHNIQUE: DURING THE INTRAVENOUS ADMINISTRATION OF 150 CC OF OMNIPAQUE 176, HELICAL CT THROUGH THE THORAX AND ABDOMEN WAS PERFORMED AT 5 MM SLICE THICKNESS. IMAGING WAS CARRIED DOWN TO THE LEVEL OF THE ILIAC CRESTS. ORAL CONTRAST HAD BEEN ADMINISTERED EARLIER. DELAYED HELICAL 5 MM IMAGES THROUGH THE KIDNEYS WERE OBTAINED. FINDINGS: THERE ARE NO PRIOR CT EXAMINATIONS OF THE CHEST OR ABDOMEN AVAILABLE FOR COMPARISON. CT CHEST: THERE IS NO EVIDENCE OF SIGNIFICANT HILAR, MEDIASTINAL, OR AXILLARY LYMPHADENOPATHY. THERE IS NO SUPRACLAVICULAR LYMPHADENOPATHY. THE THYROID GLAND HAS A NORMAL APPEARANCE. THE HEART SIZE IS NORMAL. THE LUNGS APPEAR CLEAR AND THERE ARE NO PULMONARY NODULES TO SUGGEST METASTATIC DISEASE. THERE ARE NO PLEURAL EFFUSIONS. IMPRESSION NORMAL CT OF THE CHEST. CT ABDOMEN: THERE IS VERY MILD DIFFUSE FATTY INFILTRATION OF THE LIVER WHICH MAY BE RELATED TO THE PATIENT'S PREVIOUS CHEMOTHERAPY. NO FOCAL LIVER LESIONS ARE IDENTIFIED TO SUGGEST METASTATIC DISEASE. THE SPLEEN HAS A NORMAL APPEARANCE. THE PANCREAS IS NORMAL. THE GALLBLADDER IS UNREMARKABLE BY CT AND THERE IS NO BILIARY DUCTAL DILATATION. BOTH ADRENAL GLANDS AND BOTH KIDNEYS ARE NORMAL IN APPEARANCE. THE STOMACH AND THE VISUALIZED LARGE AND SMALL BOWEL IN THE UPPER ABDOMEN ARE UNREMARKABLE. THERE IS NO SIGNIFICANT LYMPHADENOPATHY. THERE IS NO FREE FLUID. IMPRESSION: MILD DIFFUSE FATTY INFILTRATION OF THE LIVER. NORMAL CT OF THE ABDOMEN OTHERWISE.       Assessment & Plan:    1. Gross hematuria:    I explained to the patient that there are a number of causes that can be associated with blood in the urine, such as stones,  UTI's, damage to the urinary tract and/or cancer.  At this time, I felt that the patient warranted further urologic evaluation.   The AUA  guidelines state that a CT urogram is the preferred imaging study to evaluate hematuria.  I explained to the patient that a contrast material will  be injected into a vein and that in rare instances, an allergic reaction can result and may even life threatening   The patient denies any allergies to contrast and/or iodine and is not taking metformin.  She does experience some mild rash after eating shrimp. She takes Benadryl for this and the rash abates. She will take Benadryl and Zantac 1 hour prior to the CT scan. She does not want to take prednisone.  Her reproductive status is status post menopausal.    Following the imaging study,  I've recommended a cystoscopy. I described how this is performed, typically in an office setting with a flexible cystoscope. We described the risks, benefits, and possible side effects, the most common of which is a minor amount of blood in the urine and/or burning which usually resolves in 24 to 48 hours.   The patient had the opportunity to ask questions which were answered. Based upon this discussion, the patient is willing to proceed. Therefore, I've ordered: a CT Urogram and cystoscopy.  She will return following all of the above for discussion of the results.     - Urinalysis, Complete - CULTURE, URINE COMPREHENSIVE - BUN+Creat   Return for CT Urogram report and cystoscopy.  These notes generated with voice recognition software. I apologize for typographical errors.  Zara Council, [facility] [address] [place], [place] [address] [phone]

## 2015-12-11 LAB — BUN+CREAT
BUN/Creatinine Ratio: 26 — ABNORMAL HIGH (ref 9–23)
BUN: 20 mg/dL (ref 6–24)
CREATININE: 0.78 mg/dL (ref 0.57–1.00)
GFR calc Af Amer: 96 mL/min/{1.73_m2} (ref >59)
GFR calc non Af Amer: 83 mL/min/{1.73_m2} (ref >59)

## 2015-12-12 LAB — CULTURE, URINE COMPREHENSIVE

## 2015-12-19 ENCOUNTER — Ambulatory Visit (INDEPENDENT_AMBULATORY_CARE_PROVIDER_SITE_OTHER): Payer: BLUE CROSS/BLUE SHIELD | Admitting: Family Medicine

## 2015-12-19 ENCOUNTER — Encounter: Payer: Self-pay | Admitting: Family Medicine

## 2015-12-19 VITALS — BP 132/82 | HR 101 | Temp 98.4°F | Wt 166.5 lb

## 2015-12-19 DIAGNOSIS — IMO0002 Reserved for concepts with insufficient information to code with codable children: Secondary | ICD-10-CM | POA: Insufficient documentation

## 2015-12-19 DIAGNOSIS — M6749 Ganglion, multiple sites: Secondary | ICD-10-CM

## 2015-12-19 NOTE — Progress Notes (Signed)
Subjective:   Patient ID: Sarah Weeks, female    DOB: 1956/02/13, 60 y.o.   MRN: [id number]  LACONYA CLERE is a pleasant 60 y.o. year old female who presents to clinic today with Hand Pain  on 12/19/2015  HPI:  Bump on palm of right hand for months. Growing in size.  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  Bump has appeared in the the area where she holds the leash.  No tingling in her fingers or decreased grip strength.  It is becoming a little tender with pressure.  Current Outpatient Prescriptions on File Prior to Visit  Medication Sig Dispense Refill  . B Complex Vitamins (B COMPLEX PO) Take by mouth. Reported on 11/27/2015    . Biotin 7500 MCG TABS Take 1 tablet by mouth daily. Reported on 11/27/2015    . Calcium Carbonate-Vit D-Min (CALTRATE PLUS PO) Take by mouth. Reported on 11/27/2015    . Cholecalciferol (VITAMIN D3) 1000 UNITS CAPS Take 1 capsule by mouth. Reported on 11/27/2015    . Green Tea, Camillia sinensis, (GREEN TEA EXTRACT) 150 MG CAPS Take by mouth. Reported on 11/27/2015    . ibuprofen (ADVIL,MOTRIN) 200 MG tablet Take 600 mg by mouth 2 (two) times daily.    . Multiple Vitamin (MULTIVITAMIN) tablet Take 2 tablets by mouth daily. Reported on 11/27/2015    . zolpidem (AMBIEN) 10 MG tablet Take 1 tablet (10 mg total) by mouth at bedtime as needed. 30 tablet 0   No current facility-administered medications on file prior to visit.    Allergies  Allergen Reactions  . Hctz [Hydrochlorothiazide]     Blurred vision     Past Medical History  Diagnosis Date  . Arthritis   . Hay fever   . Hypertension   . Cancer [facility]) 2002    breast cancer left  . S/P chemotherapy, time since greater than 12 weeks     left 2003  . S/P radiation therapy 2003    left breast cancer  . Hematuria     gross    Past Surgical History  Procedure Laterality Date  . Breast lumpectomy  2002    L side  . Ankle fusion  2000  . Breast excisional biopsy Left     positive 2003      Family History  Problem Relation Age of Onset  . Cancer Mother   . Breast cancer Mother 88  . Alcohol abuse Father   . Heart disease Father   . Hypertension Father   . Cancer Sister   . Heart disease Sister   . Breast cancer Sister 97  . Heart disease Brother   . Stroke Maternal Grandfather   . Diabetes Maternal Grandfather   . Kidney cancer Neg Hx   . Kidney disease Neg Hx   . Prostate cancer Neg Hx   . Bladder Cancer Neg Hx     Social History   Social History  . Marital Status: Divorced    Spouse Name: N/A  . Number of Children: N/A  . Years of Education: N/A   Occupational History  . Not on file.   Social History Main Topics  . Smoking status: Former Research scientist (life sciences)  . Smokeless tobacco: Never Used  . Alcohol Use: Yes     Comment: occasional  . Drug Use: No  . Sexual Activity: No   Other Topics Concern  . Not on file   Social History Narrative   The PMH, [place], Social History, Family  History, Medications, and allergies have been reviewed in [facility], and have been updated if relevant.   Review of Systems  Musculoskeletal:       Right hand pain  Skin: Negative.   Neurological: Negative.   Psychiatric/Behavioral: Negative.   All other systems reviewed and are negative.      Objective:    BP 132/82 mmHg  Pulse 101  Temp(Src) 98.4 F (36.9 C) (Oral)  Wt 166 lb 8 oz (75.524 kg)  SpO2 99%   Physical Exam  Constitutional: She is oriented to person, place, and time. She appears well-developed and well-nourished. No distress.  HENT:  Head: Normocephalic.  Eyes: Conjunctivae are normal.  Cardiovascular: Normal rate.   Pulmonary/Chest: Effort normal.  Musculoskeletal:       Hands: Normal grip strength  Neurological: She is alert and oriented to person, place, and time. No cranial nerve deficit.  Skin: Skin is warm and dry. She is not diaphoretic.  Psychiatric: She has a normal mood and affect. Her behavior is normal. Judgment and thought content normal.   Nursing note and vitals reviewed.         Assessment & Plan:   Cyst in hand - Plan: Ambulatory referral to Hand Surgery No Follow-up on file.

## 2015-12-19 NOTE — Assessment & Plan Note (Signed)
New- refer to hand surgeon for removal. The patient indicates understanding of these issues and agrees with the plan.

## 2015-12-19 NOTE — Patient Instructions (Signed)
Good to see you. We are referring you to a hand surgeon.

## 2015-12-19 NOTE — Progress Notes (Signed)
Pre visit review using our clinic review tool, if applicable. No additional management support is needed unless otherwise documented below in the visit note. 

## 2016-01-02 ENCOUNTER — Other Ambulatory Visit: Payer: BLUE CROSS/BLUE SHIELD

## 2016-01-06 ENCOUNTER — Telehealth: Payer: Self-pay | Admitting: Family Medicine

## 2016-01-06 MED ORDER — ZOLPIDEM TARTRATE 10 MG PO TABS: 10.0000 mg | ORAL_TABLET | Freq: Every evening | ORAL | 0 refills | Status: DC | PRN

## 2016-01-06 NOTE — Telephone Encounter (Signed)
Pt left v/m requesting refill zolpidem to [facility] [address]. Last printed # 30 on 12/09/15 and last seen 12/19/15.Please advise.

## 2016-01-06 NOTE — Telephone Encounter (Signed)
Ok to print out and leave in my box for signature.

## 2016-01-06 NOTE — Telephone Encounter (Signed)
Rx called in to requested pharmacy 

## 2016-01-17 ENCOUNTER — Ambulatory Visit
Admission: RE | Admit: 2016-01-17 | Discharge: 2016-01-17 | Disposition: A | Discharge status: Home / Self Care | Payer: BLUE CROSS/BLUE SHIELD | Source: Ambulatory Visit | Attending: Urology | Admitting: Urology

## 2016-01-17 ENCOUNTER — Ambulatory Visit: Admission: RE | Admit: 2016-01-17 | Payer: BLUE CROSS/BLUE SHIELD | Source: Ambulatory Visit

## 2016-01-17 DIAGNOSIS — N200 Calculus of kidney: Secondary | ICD-10-CM | POA: Diagnosis not present

## 2016-01-17 DIAGNOSIS — R938 Abnormal findings on diagnostic imaging of other specified body structures: Secondary | ICD-10-CM | POA: Diagnosis not present

## 2016-01-17 DIAGNOSIS — R310 Gross hematuria: Secondary | ICD-10-CM | POA: Diagnosis present

## 2016-01-17 MED ORDER — IOPAMIDOL (ISOVUE-300) INJECTION 61%
125.0000 mL | Freq: Once | INTRAVENOUS | Status: AC | PRN
  Administered 2016-01-17: 125 mL via INTRAVENOUS

## 2016-01-20 ENCOUNTER — Encounter: Payer: Self-pay | Admitting: Urology

## 2016-01-20 ENCOUNTER — Ambulatory Visit (INDEPENDENT_AMBULATORY_CARE_PROVIDER_SITE_OTHER): Payer: BLUE CROSS/BLUE SHIELD | Admitting: Urology

## 2016-01-20 VITALS — BP 150/99 | HR 89 | Ht 62.0 in | Wt 166.5 lb

## 2016-01-20 DIAGNOSIS — N200 Calculus of kidney: Secondary | ICD-10-CM

## 2016-01-20 DIAGNOSIS — R310 Gross hematuria: Secondary | ICD-10-CM

## 2016-01-20 LAB — MICROSCOPIC EXAMINATION

## 2016-01-20 LAB — URINALYSIS, COMPLETE
BILIRUBIN UA: NEGATIVE
GLUCOSE, UA: NEGATIVE
Ketones, UA: NEGATIVE
Nitrite, UA: NEGATIVE
PH UA: 5 (ref 5.0–7.5)
SPEC GRAV UA: 1.02 (ref 1.005–1.030)
UUROB: 0.2 mg/dL (ref 0.2–1.0)

## 2016-01-20 MED ORDER — CIPROFLOXACIN HCL 500 MG PO TABS
500.0000 mg | ORAL_TABLET | Freq: Once | ORAL | Status: AC
  Administered 2016-01-20: 500 mg via ORAL

## 2016-01-20 MED ORDER — LIDOCAINE HCL 2 % EX GEL
1.0000 "application " | Freq: Once | CUTANEOUS | Status: AC
  Administered 2016-01-20: 1 via URETHRAL

## 2016-01-20 NOTE — Progress Notes (Signed)
01/20/2016 11:05 AM   Carlyon Shadow Octaviano Glow 22-Aug-1955 [id number]  Referring provider: Lucille Passy, MD [address] [place], [place] [address]  Chief Complaint  Patient presents with  . Cysto    gross hematuria    HPI: Sarah Weeks: Patient is a 60 -year-old Caucasian female who presents today as a referral from their PCP, Dr. Deborra Medina, for gross hematuria.  Patient states that about 1 month ago she hadn't 3 incidences of painless gross hematuria. She does not have a prior history of recurrent urinary tract infections, nephrolithiasis, trauma to the genitourinary tract or malignancies of the genitourinary tract.   Today, she is not having symptoms of frequent urination, urgency, dysuria, nocturia, incontinence, hesitancy, intermittency, straining to urinate or a weak urinary stream.  Her UA today demonstrates >30 RBC's/hpf.  She is a former smoker, with a 40 ppd history.  Quit 3 years ago.  She did undergo chemotherapy for breast cancer in 2002.    Today The patient had a recent CT scan which showed multiple stones in the right kidney is largest 12 mm. She may have hemorrhage in the right lower pole calyx. There was a question whether or not she could have papillary necrosis. Comments were made about the less probable differential diagnosis of a malignancy. The hyperdense filling defect within the right lower calyx was felt likely more to be from debris or hemorrhage  The patient describes intermittent pink color in her urine. She's not had flank pain or burning. She has never had a stone  Today she underwent flexible cystoscopy. Consent was given. She had very mild meatal stenosis. The bladder mucosa and trigone were normal. There is no stitch foreign body or carcinoma. There was no blood noted from the ureteral orifices. Sterile technique was utilized and the procedure was tolerated well   PMH: Past Medical History:  Diagnosis Date  . Arthritis   . Cancer [facility]) 2002   breast cancer  left  . Hay fever   . Hematuria    gross  . Hypertension   . S/P chemotherapy, time since greater than 12 weeks    left 2003  . S/P radiation therapy 2003   left breast cancer    Surgical History: Past Surgical History:  Procedure Laterality Date  . ANKLE FUSION  2000  . BREAST EXCISIONAL BIOPSY Left    positive 2003  . BREAST LUMPECTOMY  2002   L side    Home Medications:    Medication List       Accurate as of 01/20/16 11:05 AM. Always use your most recent med list.          B COMPLEX PO Take by mouth. Reported on 11/27/2015   Biotin 7500 MCG Tabs Take 1 tablet by mouth daily. Reported on 11/27/2015   CALTRATE PLUS PO Take by mouth. Reported on 11/27/2015   Green Tea Extract 150 MG Caps Take by mouth. Reported on 11/27/2015   ibuprofen 200 MG tablet Commonly known as:  ADVIL,MOTRIN Take 600 mg by mouth 2 (two) times daily.   multivitamin tablet Take 2 tablets by mouth daily. Reported on 11/27/2015   Vitamin D3 1000 units Caps Take 1 capsule by mouth. Reported on 11/27/2015   zolpidem 10 MG tablet Commonly known as:  AMBIEN Take 1 tablet (10 mg total) by mouth at bedtime as needed.       Allergies: No Known Allergies  Family History: Family History  Problem Relation Age of Onset  . Cancer Mother   .  Breast cancer Mother 92  . Alcohol abuse Father   . Heart disease Father   . Hypertension Father   . Cancer Sister   . Heart disease Sister   . Breast cancer Sister 33  . Heart disease Brother   . Stroke Maternal Grandfather   . Diabetes Maternal Grandfather   . Kidney cancer Neg Hx   . Kidney disease Neg Hx   . Prostate cancer Neg Hx   . Bladder Cancer Neg Hx     Social History:  reports that she has quit smoking. She has never used smokeless tobacco. She reports that she drinks alcohol. She reports that she does not use drugs.  ROS: UROLOGY Frequent Urination?: No Hard to postpone urination?: No Burning/pain with urination?: No Get up at  night to urinate?: No Leakage of urine?: No Urine stream starts and stops?: No Trouble starting stream?: No Do you have to strain to urinate?: No Blood in urine?: Yes Urinary tract infection?: No Sexually transmitted disease?: No Injury to kidneys or bladder?: No Painful intercourse?: No Weak stream?: No Currently pregnant?: No Vaginal bleeding?: No Last menstrual period?: n  Gastrointestinal Nausea?: No Vomiting?: No Indigestion/heartburn?: No Diarrhea?: No Constipation?: No  Constitutional Fever: No Night sweats?: No Weight loss?: No Fatigue?: No  Skin Skin rash/lesions?: No Itching?: No  Eyes Blurred vision?: No Double vision?: No  Ears/Nose/Throat Sore throat?: No Sinus problems?: No  Hematologic/Lymphatic Swollen glands?: No Easy bruising?: Yes  Cardiovascular Leg swelling?: No Chest pain?: No  Respiratory Cough?: No Shortness of breath?: No  Endocrine Excessive thirst?: No  Musculoskeletal Back pain?: Yes Joint pain?: No  Neurological Headaches?: Yes Dizziness?: No  Psychologic Depression?: No Anxiety?: No  Physical Exam: BP (!) 150/99   Pulse 89   Ht '5\' 2"'$  (1.575 m)   Wt 166 lb 8 oz (75.5 kg)   BMI 30.45 kg/m     Laboratory Data: Lab Results  Component Value Date   WBC 6.2 12/03/2014   HGB 12.5 12/03/2014   HCT 37.0 12/03/2014   MCV 98.1 12/03/2014   PLT 286.0 12/03/2014    Lab Results  Component Value Date   CREATININE 0.78 12/10/2015    No results found for: PSA  No results found for: TESTOSTERONE  No results found for: HGBA1C  Urinalysis    Component Value Date/Time   APPEARANCEUR Hazy (A) 12/10/2015 1441   GLUCOSEU Negative 12/10/2015 1441   BILIRUBINUR Negative 12/10/2015 1441   PROTEINUR Trace (A) 12/10/2015 1441   UROBILINOGEN 0.2 11/27/2015 1036   NITRITE Negative 12/10/2015 1441   LEUKOCYTESUR 1+ (A) 12/10/2015 1441    Pertinent Imaging: Reviewed  Assessment & Plan:  A picture was drawn.  The patient has a filling defect associated with a larger renal stone and small calculi. The differential diagnosis has been discussed. She otherwise remains asymptomatic. I would like to have the patient come back and see Dr. Erlene Quan. Dr. Erlene Quan may choose to repeat the CT scan in for instance 2 months and see if the soft tissue defect has cleared and thus a malignancy would be ruled out. Her stone could then be treated with lithotripsy assuming it was the cause of the blood in the urine and blood in the right kidney. Another option is ureteroscopy with definitive stone therapy and to rule out a malignancy.  The patient was very comfortable with the idea of repeating the CAT scan. She will see Dr. Erlene Quan in 1 month for further discussion   1. Gross hematuria  2. Renal calculi - Urinalysis, Complete - ciprofloxacin (CIPRO) tablet 500 mg; Take 1 tablet (500 mg total) by mouth once. - lidocaine (XYLOCAINE) 2 % jelly 1 application; Place 1 application into the urethra once.   Return in about 4 weeks (around 02/17/2016) for see Dr Erlene Quan.  Reece Packer, MD  [facility] [address] [place], [place] [address] [phone]

## 2016-02-10 ENCOUNTER — Other Ambulatory Visit: Payer: Self-pay

## 2016-02-10 MED ORDER — ZOLPIDEM TARTRATE 10 MG PO TABS: 10.0000 mg | ORAL_TABLET | Freq: Every evening | ORAL | 0 refills | Status: DC | PRN

## 2016-02-10 NOTE — Telephone Encounter (Signed)
Rx called in to requested phamacy

## 2016-02-10 NOTE — Telephone Encounter (Signed)
Pt request refill ambien to [facility] [address] Pt last seen for insomnia on 09/05/15. Last refilled # 30 on 01/06/16.

## 2016-02-26 ENCOUNTER — Ambulatory Visit: Payer: BLUE CROSS/BLUE SHIELD | Admitting: Urology

## 2016-02-26 ENCOUNTER — Other Ambulatory Visit: Payer: Self-pay | Admitting: Family Medicine

## 2016-02-26 DIAGNOSIS — Z1231 Encounter for screening mammogram for malignant neoplasm of breast: Secondary | ICD-10-CM

## 2016-02-27 ENCOUNTER — Encounter: Payer: Self-pay | Admitting: Urology

## 2016-02-27 ENCOUNTER — Ambulatory Visit (INDEPENDENT_AMBULATORY_CARE_PROVIDER_SITE_OTHER): Payer: BLUE CROSS/BLUE SHIELD | Admitting: Urology

## 2016-02-27 VITALS — BP 121/87 | HR 105 | Ht 62.0 in | Wt 166.0 lb

## 2016-02-27 DIAGNOSIS — R310 Gross hematuria: Secondary | ICD-10-CM | POA: Diagnosis not present

## 2016-02-27 DIAGNOSIS — N200 Calculus of kidney: Secondary | ICD-10-CM

## 2016-02-27 DIAGNOSIS — N2889 Other specified disorders of kidney and ureter: Secondary | ICD-10-CM | POA: Diagnosis not present

## 2016-02-27 NOTE — Progress Notes (Signed)
02/27/2016 9:05 AM   Sarah Weeks Octaviano Glow December 22, 1955 [id number]  Referring provider: Lucille Passy, MD [place], [place] [address]  HPI: 60 -year-old Caucasian female who was initially referred for further workup of her episode of gross hematuria. She underwent further workup including CT urogram which showed several right-sided stones including a 14 mm stone. There was question of a soft tissue density possibly hemorrhage in the right lower pole vs. Papillary necrosis vs. Tumor.    Cystoscopy performed by Dr. Matilde Sprang on 01/20/2016 was unremarkable other than for some mild meatal stenosis.  She is a former smoker, with a 40 ppd history.  Quit 3 years ago.  She did undergo chemotherapy for breast cancer in 2002.    Prior to this, she has no history of kidney stones or flank pain.  She returns to the office today to discuss management of her stone as well right lower pole abnormality.   PMH: Past Medical History:  Diagnosis Date  . Arthritis   . Cancer [facility]) 2002   breast cancer left  . Hay fever   . Hematuria    gross  . Hypertension   . S/P chemotherapy, time since greater than 12 weeks    left 2003  . S/P radiation therapy 2003   left breast cancer    Surgical History: Past Surgical History:  Procedure Laterality Date  . ANKLE FUSION  2000  . BREAST EXCISIONAL BIOPSY Left    positive 2003  . BREAST LUMPECTOMY  2002   L side    Home Medications:    Medication List       Accurate as of 02/27/16 11:59 PM. Always use your most recent med list.          B COMPLEX PO Take by mouth. Reported on 11/27/2015   Biotin 7500 MCG Tabs Take 1 tablet by mouth daily. Reported on 11/27/2015   CALTRATE PLUS PO Take by mouth. Reported on 11/27/2015   Green Tea Extract 150 MG Caps Take by mouth. Reported on 11/27/2015   ibuprofen 200 MG tablet Commonly known as:  ADVIL,MOTRIN Take 600 mg by mouth 2 (two) times daily.   multivitamin tablet Take 2 tablets  by mouth daily. Reported on 11/27/2015   Vitamin D3 1000 units Caps Take 1 capsule by mouth. Reported on 11/27/2015   zolpidem 10 MG tablet Commonly known as:  AMBIEN Take 1 tablet (10 mg total) by mouth at bedtime as needed.       Allergies: No Known Allergies  Family History: Family History  Problem Relation Age of Onset  . Cancer Mother   . Breast cancer Mother 86  . Alcohol abuse Father   . Heart disease Father   . Hypertension Father   . Cancer Sister   . Heart disease Sister   . Breast cancer Sister 73  . Heart disease Brother   . Stroke Maternal Grandfather   . Diabetes Maternal Grandfather   . Kidney cancer Neg Hx   . Kidney disease Neg Hx   . Prostate cancer Neg Hx   . Bladder Cancer Neg Hx     Social History:  reports that she has quit smoking. She has never used smokeless tobacco. She reports that she drinks alcohol. She reports that she does not use drugs.  ROS: UROLOGY Frequent Urination?: No Hard to postpone urination?: No Burning/pain with urination?: No Get up at night to urinate?: No Leakage of urine?: No Urine stream starts and stops?: No  Trouble starting stream?: No Do you have to strain to urinate?: No Blood in urine?: No Urinary tract infection?: No Sexually transmitted disease?: No Injury to kidneys or bladder?: No Painful intercourse?: No Weak stream?: No Currently pregnant?: No Vaginal bleeding?: No Last menstrual period?: n  Gastrointestinal Nausea?: No Vomiting?: No Indigestion/heartburn?: No Diarrhea?: No Constipation?: No  Constitutional Fever: No Night sweats?: No Weight loss?: No Fatigue?: No  Skin Skin rash/lesions?: No Itching?: No  Eyes Blurred vision?: No Double vision?: No  Ears/Nose/Throat Sore throat?: No Sinus problems?: No  Hematologic/Lymphatic Swollen glands?: No Easy bruising?: No  Cardiovascular Leg swelling?: No Chest pain?: No  Respiratory Cough?: No Shortness of breath?:  No  Endocrine Excessive thirst?: No  Musculoskeletal Back pain?: No Joint pain?: No  Neurological Headaches?: No Dizziness?: No  Psychologic Depression?: No Anxiety?: No  Physical Exam: BP 121/87   Pulse (!) 105   Ht '5\' 2"'$  (1.575 m)   Wt 166 lb (75.3 kg)   BMI 30.36 kg/m   Physical Exam  Constitutional: She is oriented to person, place, and time. She appears well-developed and well-nourished.  HENT:  Head: Normocephalic and atraumatic.  Eyes: EOM are normal. Pupils are equal, round, and reactive to light.  Neck: Normal range of motion. Neck supple.  Cardiovascular: Normal rate and regular rhythm.   Pulmonary/Chest: Effort normal and breath sounds normal.  Abdominal: Soft. She exhibits no distension.  Musculoskeletal: Normal range of motion. She exhibits no edema.  Neurological: She is alert and oriented to person, place, and time.  Skin: Skin is warm and dry.  Psychiatric: She has a normal mood and affect.  Vitals reviewed.    Laboratory Data: Lab Results  Component Value Date   WBC 6.2 12/03/2014   HGB 12.5 12/03/2014   HCT 37.0 12/03/2014   MCV 98.1 12/03/2014   PLT 286.0 12/03/2014    Lab Results  Component Value Date   CREATININE 0.78 12/10/2015    Pertinent Imaging: CLINICAL DATA:  Painless gross hematuria x1.5 months. History of left breast cancer status post lumpectomy, chemotherapy, and radiation in 2002.  EXAM: CT ABDOMEN AND PELVIS WITHOUT AND WITH CONTRAST  TECHNIQUE: Multidetector CT imaging of the abdomen and pelvis was performed following the standard protocol before and following the bolus administration of intravenous contrast.  CONTRAST:  119m ISOVUE-300 IOPAMIDOL (ISOVUE-300) INJECTION 61%  COMPARISON:  None.  FINDINGS: Lower chest:  Lung bases are clear.  Hepatobiliary: Liver is within normal limits. No suspicious/enhancing hepatic lesions.  Gallbladder is unremarkable. No intrahepatic or extrahepatic  ductal dilatation.  Pancreas: Within normal limits.  Spleen: Within normal limits.  Adrenals/Urinary Tract: Adrenal glands are within normal limits.  Mildly heterogeneous perfusion in the lateral right upper kidney (series 6/image 20), without definite underlying cyst. No suspicious/enhancing renal lesions.  12 mm calculus in the right lower renal collecting system. Multiple additional nonobstructing right renal calculi measuring up to 3 mm.  No ureteral or bladder calculi. Mild fullness of the right renal collecting system without frank hydronephrosis.  Bladder is within normal limits.  Hyperdense filling defect within a right lower pole calyx on precontrast imaging (series 2/image 31), which persists on postcontrast imaging outlined by contrast (series 6/ image 29), but is relatively inconspicuous on delayed imaging (series 14/image 35). This appearance may reflect a small amount of hemorrhage within the collecting system. While urothelial lesion is difficult to exclude due to the relatively delayed phase of the postcontrast sequence, with contrast already in the collecting system, the lesion  shifts in position on prone imaging and therefore favors mobile debris/hemorrhage.  Subtle findings of calyceal blunting are suspected on delayed imaging, raising the possibility of papillary necrosis, although this remains equivocal.  Stomach/Bowel: Stomach is within normal limits.  No evidence of bowel obstruction.  Normal appendix (series 6/image 56).  Vascular/Lymphatic: No evidence of abdominal aortic aneurysm.  Atherosclerotic calcifications of the abdominal aorta and branch vessels.  No suspicious abdominopelvic lymphadenopathy.  Reproductive: Uterus is within normal limits.  Bilateral ovaries are within normal limits.  Other: No abdominopelvic ascites.  Musculoskeletal: Visualized osseous structures are within  normal limits.  IMPRESSION: Multiple right renal calculi measuring up to 12 mm in the right lower pole. No ureteral or bladder calculi.  Possible hemorrhage within a right lower pole calyx.  Possible mild calyceal blunting on the right, raising the possibility of papillary necrosis.   Electronically Signed   By: Julian Hy M.D.   On: 01/17/2016 13:25   CT scan personally reviewed today with the patient.  Assessment & Plan:    1. Gross hematuria S/p CT Urogram and cystoscopy.    2. Right nephrolithiasis Large 12 mm R stone.  Given the size of the stone, various options were discussed including continued observation, or treatment of the stone.  We discussed various treatment options including ESWL vs. ureteroscopy, laser lithotripsy, and stent. We discussed the risks and benefits of both including bleeding, infection, damage to surrounding structures, efficacy with need for possible further intervention, and need for temporary ureteral stent.  She is most interested in proceeding with right ureteroscopy. She understands the risks and benefits of this. In addition, we will be able to assess the right lower pole and biopsy/treat as needed. She is agreeable with this plan.  3. Right renal mass Filling defect within the right lower pole instantly identified on CT urogram. Suspect benign etiology, however, unable to completely rule out underlying malignancy without direct visualization. We will proceed with treatment of her right-sided stone and concomitantly evaluate the right lower pole at the same time.  Schedule R URS, LL, stent, possible right biopsy  Hollice Espy, MD  [facility] [address], [place], [place] [address] [phone]

## 2016-02-28 ENCOUNTER — Other Ambulatory Visit: Payer: Self-pay | Admitting: Radiology

## 2016-02-28 ENCOUNTER — Telehealth: Payer: Self-pay | Admitting: Radiology

## 2016-02-28 DIAGNOSIS — N398 Other specified disorders of urinary system: Secondary | ICD-10-CM

## 2016-02-28 DIAGNOSIS — N200 Calculus of kidney: Secondary | ICD-10-CM

## 2016-02-28 NOTE — Telephone Encounter (Signed)
Notified pt of surgery scheduled with Dr Erlene Quan on 03/23/16 per pt's request, pre-admit testing appt on 03/09/16 '@1'$ :00 & to call Friday prior to surgery for arrival time to [facility]. Pt voices understanding.

## 2016-03-09 ENCOUNTER — Other Ambulatory Visit: Payer: BLUE CROSS/BLUE SHIELD

## 2016-03-10 ENCOUNTER — Other Ambulatory Visit: Payer: Self-pay

## 2016-03-10 MED ORDER — ZOLPIDEM TARTRATE 10 MG PO TABS: 10.0000 mg | ORAL_TABLET | Freq: Every evening | ORAL | 0 refills | Status: DC | PRN

## 2016-03-10 NOTE — Telephone Encounter (Signed)
R/X for Ambien '10mg'$  1 po qhs prn #30, no additional refills called in to [facility] on [address] for patient per Md, Dr. Hulen Shouts approval.  Patient notified R/X called in to pharmacy.

## 2016-03-10 NOTE — Telephone Encounter (Signed)
Patient requesting refill on her Ambien '10mg'$  to [facility] in [place].    Last refilled #30 on 02/10/16.  Please advise.

## 2016-03-12 ENCOUNTER — Encounter
Admission: RE | Admit: 2016-03-12 | Discharge: 2016-03-12 | Disposition: A | Discharge status: Home / Self Care | Payer: BLUE CROSS/BLUE SHIELD | Source: Ambulatory Visit | Attending: Urology | Admitting: Urology

## 2016-03-12 ENCOUNTER — Other Ambulatory Visit: Payer: BLUE CROSS/BLUE SHIELD

## 2016-03-12 DIAGNOSIS — Z01818 Encounter for other preprocedural examination: Secondary | ICD-10-CM | POA: Diagnosis not present

## 2016-03-12 DIAGNOSIS — R0602 Shortness of breath: Secondary | ICD-10-CM | POA: Insufficient documentation

## 2016-03-12 HISTORY — DX: Anxiety disorder, unspecified: F41.9

## 2016-03-12 HISTORY — DX: Gastro-esophageal reflux disease without esophagitis: K21.9

## 2016-03-12 HISTORY — DX: Reserved for inherently not codable concepts without codable children: IMO0001

## 2016-03-12 LAB — CBC
HEMATOCRIT: 38.3 % (ref 35.0–47.0)
Hemoglobin: 13.4 g/dL (ref 12.0–16.0)
MCH: 34.5 pg — ABNORMAL HIGH (ref 26.0–34.0)
MCHC: 35 g/dL (ref 32.0–36.0)
MCV: 98.6 fL (ref 80.0–100.0)
Platelets: 242 10*3/uL (ref 150–440)
RBC: 3.88 MIL/uL (ref 3.80–5.20)
RDW: 12.6 % (ref 11.5–14.5)
WBC: 5.7 10*3/uL (ref 3.6–11.0)

## 2016-03-12 NOTE — Patient Instructions (Signed)
  Your procedure is scheduled on: 03/23/16 Mon Report to Same Day Surgery 2nd floor medical mall To find out your arrival time please call [phone] between 1PM - 3PM on 03/20/16 Fri  Remember: Instructions that are not followed completely may result in serious medical risk, up to and including death, or upon the discretion of your surgeon and anesthesiologist your surgery may need to be rescheduled.    _x___ 1. Do not eat food or drink liquids after midnight. No gum chewing or hard candies.     __x__ 2. No Alcohol for 24 hours before or after surgery.   __x__3. No Smoking for 24 prior to surgery.   ____  4. Bring all medications with you on the day of surgery if instructed.    __x__ 5. Notify your doctor if there is any change in your medical condition     (cold, fever, infections).     Do not wear jewelry, make-up, hairpins, clips or nail polish.  Do not wear lotions, powders, or perfumes. You may wear deodorant.  Do not shave 48 hours prior to surgery. Men may shave face and neck.  Do not bring valuables to the hospital.    [facility] is not responsible for any belongings or valuables.               Contacts, dentures or bridgework may not be worn into surgery.  Leave your suitcase in the car. After surgery it may be brought to your room.  For patients admitted to the hospital, discharge time is determined by your treatment team.   Patients discharged the day of surgery will not be allowed to drive home.    Please read over the following fact sheets that you were given:   [facility] Preparing for Surgery and or MRSA Information   _x___ Take these medicines the morning of surgery with A SIP OF WATER:    1. None  2.  3.  4.  5.  6.  ____Fleets enema or Magnesium Citrate as directed.   _x___ Use CHG Soap or sage wipes as directed on instruction sheet   ____ Use inhalers on the day of surgery and bring to hospital day of surgery  ____ Stop metformin 2 days prior to  surgery    ____ Take 1/2 of usual insulin dose the night before surgery and none on the morning of           surgery.   ____ Stop aspirin or coumadin, or plavix  x__ Stop Anti-inflammatories such as Advil, Aleve, Ibuprofen, Motrin, Naproxen,          Naprosyn, Goodies powders or aspirin products. Ok to take Tylenol.   __x__ Stop supplements until after surgery.    ____ Bring C-Pap to the hospital.

## 2016-03-16 ENCOUNTER — Ambulatory Visit
Admission: RE | Admit: 2016-03-16 | Discharge: 2016-03-16 | Disposition: A | Discharge status: Home / Self Care | Payer: BLUE CROSS/BLUE SHIELD | Source: Ambulatory Visit | Attending: Family Medicine | Admitting: Family Medicine

## 2016-03-16 DIAGNOSIS — Z1231 Encounter for screening mammogram for malignant neoplasm of breast: Secondary | ICD-10-CM | POA: Diagnosis not present

## 2016-03-16 HISTORY — DX: Malignant neoplasm of unspecified site of unspecified female breast: C50.919

## 2016-03-16 HISTORY — DX: Personal history of irradiation: Z92.3

## 2016-03-16 HISTORY — DX: Personal history of antineoplastic chemotherapy: Z92.21

## 2016-03-22 MED ORDER — CEFAZOLIN IN D5W 1 GM/50ML IV SOLN
1.0000 g | INTRAVENOUS | Status: AC
  Administered 2016-03-23: 1 g via INTRAVENOUS

## 2016-03-23 ENCOUNTER — Ambulatory Visit: Payer: BLUE CROSS/BLUE SHIELD | Admitting: Anesthesiology

## 2016-03-23 ENCOUNTER — Encounter
Admission: RE | Disposition: A | Discharge status: Home / Self Care | Payer: Self-pay | Source: Ambulatory Visit | Attending: Urology

## 2016-03-23 ENCOUNTER — Encounter: Payer: Self-pay | Admitting: *Deleted

## 2016-03-23 ENCOUNTER — Ambulatory Visit
Admission: RE | Admit: 2016-03-23 | Discharge: 2016-03-23 | Disposition: A | Discharge status: Home / Self Care | Payer: BLUE CROSS/BLUE SHIELD | Source: Ambulatory Visit | Attending: Urology | Admitting: Urology

## 2016-03-23 DIAGNOSIS — Z923 Personal history of irradiation: Secondary | ICD-10-CM | POA: Insufficient documentation

## 2016-03-23 DIAGNOSIS — N200 Calculus of kidney: Secondary | ICD-10-CM | POA: Insufficient documentation

## 2016-03-23 DIAGNOSIS — M1990 Unspecified osteoarthritis, unspecified site: Secondary | ICD-10-CM | POA: Insufficient documentation

## 2016-03-23 DIAGNOSIS — E669 Obesity, unspecified: Secondary | ICD-10-CM | POA: Diagnosis not present

## 2016-03-23 DIAGNOSIS — N2889 Other specified disorders of kidney and ureter: Secondary | ICD-10-CM | POA: Diagnosis present

## 2016-03-23 DIAGNOSIS — Z6830 Body mass index (BMI) 30.0-30.9, adult: Secondary | ICD-10-CM | POA: Diagnosis not present

## 2016-03-23 DIAGNOSIS — F419 Anxiety disorder, unspecified: Secondary | ICD-10-CM | POA: Diagnosis not present

## 2016-03-23 DIAGNOSIS — Z9221 Personal history of antineoplastic chemotherapy: Secondary | ICD-10-CM | POA: Insufficient documentation

## 2016-03-23 DIAGNOSIS — Z853 Personal history of malignant neoplasm of breast: Secondary | ICD-10-CM | POA: Insufficient documentation

## 2016-03-23 DIAGNOSIS — K219 Gastro-esophageal reflux disease without esophagitis: Secondary | ICD-10-CM | POA: Diagnosis not present

## 2016-03-23 DIAGNOSIS — I10 Essential (primary) hypertension: Secondary | ICD-10-CM | POA: Insufficient documentation

## 2016-03-23 DIAGNOSIS — N398 Other specified disorders of urinary system: Secondary | ICD-10-CM

## 2016-03-23 HISTORY — PX: CYSTOSCOPY WITH STENT PLACEMENT: SHX5790

## 2016-03-23 HISTORY — PX: URETEROSCOPY WITH HOLMIUM LASER LITHOTRIPSY: SHX6645

## 2016-03-23 SURGERY — URETEROSCOPY, WITH LITHOTRIPSY USING HOLMIUM LASER
Anesthesia: General | Laterality: Right

## 2016-03-23 MED ORDER — CEFAZOLIN IN D5W 1 GM/50ML IV SOLN
INTRAVENOUS | Status: AC
  Administered 2016-03-23: 1 g via INTRAVENOUS
  Filled 2016-03-23: qty 50

## 2016-03-23 MED ORDER — LIDOCAINE HCL (CARDIAC) 20 MG/ML IV SOLN
INTRAVENOUS | Status: DC | PRN
  Administered 2016-03-23: 80 mg via INTRAVENOUS

## 2016-03-23 MED ORDER — LACTATED RINGERS IV SOLN
INTRAVENOUS | Status: DC
  Administered 2016-03-23 (×2): via INTRAVENOUS

## 2016-03-23 MED ORDER — TAMSULOSIN HCL 0.4 MG PO CAPS: 0.4000 mg | ORAL_CAPSULE | Freq: Every day | ORAL | 0 refills | Status: DC

## 2016-03-23 MED ORDER — OXYBUTYNIN CHLORIDE 5 MG PO TABS: 5.0000 mg | ORAL_TABLET | Freq: Three times a day (TID) | ORAL | 0 refills | Status: DC | PRN

## 2016-03-23 MED ORDER — PROMETHAZINE HCL 25 MG/ML IJ SOLN
6.2500 mg | INTRAMUSCULAR | Status: DC | PRN
Start: 2016-03-23 — End: 2016-03-23

## 2016-03-23 MED ORDER — FENTANYL CITRATE (PF) 100 MCG/2ML IJ SOLN
INTRAMUSCULAR | Status: DC | PRN
  Administered 2016-03-23 (×3): 50 ug via INTRAVENOUS

## 2016-03-23 MED ORDER — HYDROCODONE-ACETAMINOPHEN 5-325 MG PO TABS: 1.0000 | ORAL_TABLET | Freq: Four times a day (QID) | ORAL | 0 refills | Status: DC | PRN

## 2016-03-23 MED ORDER — SUGAMMADEX SODIUM 200 MG/2ML IV SOLN
INTRAVENOUS | Status: DC | PRN
  Administered 2016-03-23: 150.6 mg via INTRAVENOUS

## 2016-03-23 MED ORDER — MIDAZOLAM HCL 2 MG/2ML IJ SOLN
INTRAMUSCULAR | Status: DC | PRN
  Administered 2016-03-23: 2 mg via INTRAVENOUS

## 2016-03-23 MED ORDER — OXYCODONE HCL 5 MG PO TABS
5.0000 mg | ORAL_TABLET | Freq: Once | ORAL | Status: AC | PRN
  Administered 2016-03-23: 5 mg via ORAL

## 2016-03-23 MED ORDER — OXYCODONE HCL 5 MG/5ML PO SOLN: 5.0000 mg | Freq: Once | ORAL | Status: AC | PRN

## 2016-03-23 MED ORDER — SODIUM CHLORIDE 0.9 % IR SOLN
Status: DC | PRN
  Administered 2016-03-23: 1800 mL via INTRAVESICAL

## 2016-03-23 MED ORDER — FENTANYL CITRATE (PF) 100 MCG/2ML IJ SOLN: 25.0000 ug | INTRAMUSCULAR | Status: DC | PRN

## 2016-03-23 MED ORDER — OXYCODONE HCL 5 MG PO TABS
ORAL_TABLET | ORAL | Status: AC
  Filled 2016-03-23: qty 1

## 2016-03-23 MED ORDER — ONDANSETRON HCL 4 MG/2ML IJ SOLN
INTRAMUSCULAR | Status: DC | PRN
  Administered 2016-03-23: 4 mg via INTRAVENOUS

## 2016-03-23 MED ORDER — MEPERIDINE HCL 25 MG/ML IJ SOLN: 6.2500 mg | INTRAMUSCULAR | Status: DC | PRN

## 2016-03-23 MED ORDER — PROPOFOL 10 MG/ML IV BOLUS
INTRAVENOUS | Status: DC | PRN
  Administered 2016-03-23: 50 mg via INTRAVENOUS
  Administered 2016-03-23: 150 mg via INTRAVENOUS

## 2016-03-23 MED ORDER — ROCURONIUM BROMIDE 100 MG/10ML IV SOLN
INTRAVENOUS | Status: DC | PRN
  Administered 2016-03-23: 30 mg via INTRAVENOUS
  Administered 2016-03-23 (×2): 10 mg via INTRAVENOUS

## 2016-03-23 MED ORDER — FAMOTIDINE 20 MG PO TABS
ORAL_TABLET | ORAL | Status: AC
  Administered 2016-03-23: 07:00:00
  Filled 2016-03-23: qty 1

## 2016-03-23 MED ORDER — IOTHALAMATE MEGLUMINE 43 % IV SOLN
INTRAVENOUS | Status: DC | PRN
  Administered 2016-03-23: 40 mL

## 2016-03-23 MED ORDER — DEXAMETHASONE SODIUM PHOSPHATE 10 MG/ML IJ SOLN
INTRAMUSCULAR | Status: DC | PRN
  Administered 2016-03-23: 10 mg via INTRAVENOUS

## 2016-03-23 MED ORDER — FAMOTIDINE 20 MG PO TABS: 20.0000 mg | ORAL_TABLET | Freq: Once | ORAL | Status: DC

## 2016-03-23 SURGICAL SUPPLY — 42 items
ADAPTER SCOPE UROLOK II (MISCELLANEOUS) ×3 IMPLANT
BAG DRAIN CYSTO-URO LG1000N (MISCELLANEOUS) ×3 IMPLANT
BASKET ZERO TIP 1.9FR (BASKET) IMPLANT
BRUSH SCRUB EZ 1% IODOPHOR (MISCELLANEOUS) ×3 IMPLANT
CATH FOL 2WAY LX 16X5 (CATHETERS) IMPLANT
CATH URETL 5X70 OPEN END (CATHETERS) ×3 IMPLANT
CNTNR SPEC 2.5X3XGRAD LEK (MISCELLANEOUS) ×2
CONRAY 43 FOR UROLOGY 50M (MISCELLANEOUS) ×3 IMPLANT
CONT SPEC 4OZ STER OR WHT (MISCELLANEOUS) ×1
CONTAINER SPEC 2.5X3XGRAD LEK (MISCELLANEOUS) ×2 IMPLANT
CONTOUR STENT  6 FR X 22CM IMPLANT
CORD URO TURP 10FT (MISCELLANEOUS) ×3 IMPLANT
DRAPE UTILITY 15X26 TOWEL STRL (DRAPES) ×3 IMPLANT
ELECT REM PT RETURN 9FT ADLT (ELECTROSURGICAL)
ELECTRODE REM PT RTRN 9FT ADLT (ELECTROSURGICAL) IMPLANT
FIBER LASER LITHO 273 (Laser) ×3 IMPLANT
GLOVE BIO SURGEON STRL SZ 6.5 (GLOVE) ×6 IMPLANT
GLOVE BIO SURGEON STRL SZ7 (GLOVE) ×6 IMPLANT
GOWN STRL REUS W/ TWL LRG LVL3 (GOWN DISPOSABLE) ×4 IMPLANT
GOWN STRL REUS W/ TWL LRG LVL4 (GOWN DISPOSABLE) IMPLANT
GOWN STRL REUS W/TWL LRG LVL3 (GOWN DISPOSABLE) ×2
GOWN STRL REUS W/TWL LRG LVL4 (GOWN DISPOSABLE)
GUIDEWIRE SUPER STIFF (WIRE) ×3 IMPLANT
HOLDER FOLEY CATH W/STRAP (MISCELLANEOUS) ×3 IMPLANT
INTRODUCER DILATOR DOUBLE (INTRODUCER) IMPLANT
KIT RM TURNOVER CYSTO AR (KITS) ×3 IMPLANT
PACK CYSTO AR (MISCELLANEOUS) ×3 IMPLANT
PREP PVP WINGED SPONGE (MISCELLANEOUS) IMPLANT
SENSORWIRE 0.038 NOT ANGLED (WIRE) ×3
SET CYSTO W/LG BORE CLAMP LF (SET/KITS/TRAYS/PACK) IMPLANT
SET IRRIG Y TYPE TUR BLADDER L (SET/KITS/TRAYS/PACK) ×3 IMPLANT
SHEATH URETERAL 12FRX35CM (MISCELLANEOUS) ×3 IMPLANT
SOL .9 NS 3000ML IRR  AL (IV SOLUTION) ×1
SOL .9 NS 3000ML IRR UROMATIC (IV SOLUTION) ×2 IMPLANT
STENT URET 6FRX22 CONTOUR (STENTS) ×3 IMPLANT
STENT URET 6FRX24 CONTOUR (STENTS) IMPLANT
STENT URET 6FRX26 CONTOUR (STENTS) IMPLANT
SURGILUBE 2OZ TUBE FLIPTOP (MISCELLANEOUS) ×3 IMPLANT
SYRINGE IRR TOOMEY STRL 70CC (SYRINGE) ×3 IMPLANT
WATER STERILE IRR 1000ML POUR (IV SOLUTION) ×3 IMPLANT
WATER STERILE IRR 3000ML UROMA (IV SOLUTION) IMPLANT
WIRE SENSOR 0.038 NOT ANGLED (WIRE) ×2 IMPLANT

## 2016-03-23 NOTE — Op Note (Signed)
Date of procedure: 03/23/16  Preoperative diagnosis:  1. Gross hematuria 2. Right renal stone 3. Possible right urothelial lesion   Postoperative diagnosis:  1. Gross hematuria 2. Right renal stone 3. 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 Right retrograde pyelogram 2. Right ureteroscopy 3. Laser lithotripsy 4. Right ureteral stent placement 5. Basket extraction of Stone fragment  Surgeon: Hollice Espy, MD  Anesthesia: General  Complications: None  Intraoperative findings: No right-sided upper tract urothelial lesions identified. UPJ narrowing on retrograde pyelogram with renal pelvic fullness/caliectasis. 12 mm right lower pole stone treated.  EBL: Minimal  Specimens: Stone fragment  Drains: 6 x 22 French double-J ureteral stent  Indication: Sarah Weeks is a 60 y.o. patient with history of gross hematuria who underwent further workup with CT urogram which showed a 12 mm right lower pole calculus along with other nonobstructing stones. There is fullness of the right collecting system without overt hydronephrosis. There was a hyperdense defect within the right lower pole calyx which either represented a urothelial lesion versus clot/hemorrhage. .  After reviewing the management options for treatment, she elected to proceed with the above surgical procedure(s). We have discussed the potential benefits and risks of the procedure, side effects of the proposed treatment, the likelihood of the patient achieving the goals of the procedure, and any potential problems that might occur during the procedure or recuperation. Informed consent has been obtained.  Description of procedure:  The patient was taken to the operating room and general anesthesia was induced.  The patient was placed in the dorsal lithotomy position, prepped and draped in the usual sterile fashion, and preoperative antibiotics were administered. A preoperative time-out was  performed.   A 21 French scope was advanced per urethra into the bladder. Attention was turned to the right ureteral orifice which was cannulated using a 5 [place] open-ended ureteral catheter. Contrast was then gently injected to perform a retrograde pyelogram which revealed a delicate appearing ureter with a beaklike narrowing at the UPJ. There is no overt hydronephrosis on the right but there was significantly dilated calyces and a filling defect within the lower pole calyx consistent with the known stone. This was mildly suspicious of a mild UPJ obstruction. A sensor wire was then passed up to level of the kidney without difficulty. Using a dual lumen introducer just within the distal ureter, a second Super Stiff wire was introduced to level of the kidney under fluoroscopic guidance. A Cook 12/14 access sheath was then advanced to the proximal ureter without difficulty. The inner lumen and the superstiff wire were removed. A 8 [place] dual lumen flexible ureteroscope was then advanced into the renal pelvis.  Upon the initial advancement beyond the UPJ, it was noted to be fairly narrow and did not traverse the UPJ easily. A wire was placed through the scope and the scope was allowed to pass over the wire to help facilitate its entry into the collecting system. This was relatively tight but ultimately successful. Each subsequent pass of the UPJ, the scope did traverse easily through this narrowed area. A formal pyeloscopy was performed. This revealed fairly narrow infundibula with dilated calyces. Within the lower pole, the large 12 mm stone was identified. There was no urothelial lesions or any suspicious tumors in the upper tract therefore no urothelial biopsies were performed. A 273  laser fiber was then brought in and using settings of 0.2 J and 40 Hz, the stone was dusted into tiny particles. A  1.9 [place] to plus nitinol basket was then used to extract the majority of these pieces. This was performed  over the course of approximately 2 hours.  When all of the large particles were removed and no stone fragments larger than the size of the tendon laser fiber were remaining, the scope was backed to the proximal ureter can. A second retrograde pyelogram was performed again revealing the beaking at the UPJ and persistent caliectasis. There is no extravasation. This created a roadmap of the kidney. Again, each every calyx was strictly visualized to ensure that there was no significant residual stone fragments or lesions. Once deemed satisfactory, the scope was then backed down the length of the ureter removing the access sheath along the way. There was no ureteral injury or fragments noted upon direct visualization. A 6 x 22 French double-J ureteral stent was then advanced over the sensor wire up to level of the kidney. The wire was partially withdrawn until the coil was seen looping over a midpole calyx. The wire was then fully withdrawn and a full coil was noted within the bladder. The bladder was then drained. The patient was cleaned and dried, repositioned the supine position, reversed from anesthesia, taken the PACU in stable condition.  Plan: Patient will follow-up in clinic in 10 days for cystoscopy stent, stent removal. We will leave her stent in for additional days than normal to help facilitate passage of a good amount of stone dust material.  Hollice Espy, M.D.

## 2016-03-23 NOTE — Interval H&P Note (Signed)
History and Physical Interval Note:  03/23/2016 7:28 AM  Sarah Weeks  has presented today for surgery, with the diagnosis of NEPHROLITHIASIS,UROTHELIAL LESION  The various methods of treatment have been discussed with the patient and family. After consideration of risks, benefits and other options for treatment, the patient has consented to  Procedure(s): URETEROSCOPY WITH HOLMIUM LASER LITHOTRIPSY (Right) CYSTOSCOPY WITH STENT PLACEMENT (Right) CYSTOSCOPY WITH BIOPSY (N/A) as a surgical intervention .  The patient's history has been reviewed, patient examined, no change in status, stable for surgery.  I have reviewed the patient's chart and labs.  Questions were answered to the patient's satisfaction.     Hollice Espy

## 2016-03-23 NOTE — Anesthesia Preprocedure Evaluation (Signed)
Anesthesia Evaluation  Patient identified by MRN, date of birth, ID band Patient awake    Reviewed: Allergy & Precautions, NPO status , Patient's Chart, lab work & pertinent test results  History of Anesthesia Complications Negative for: history of anesthetic complications  Airway Mallampati: II  TM Distance: >3 FB Neck ROM: Full    Dental no notable dental hx.    Pulmonary neg sleep apnea, neg COPD, former smoker,    breath sounds clear to auscultation- rhonchi (-) wheezing      Cardiovascular Exercise Tolerance: Good (-) hypertension(-) CAD and (-) Past MI  Rhythm:Regular Rate:Normal - Systolic murmurs and - Diastolic murmurs    Neuro/Psych Anxiety negative neurological ROS  negative psych ROS   GI/Hepatic Neg liver ROS, GERD  ,  Endo/Other  negative endocrine ROSneg diabetes  Renal/GU Renal disease: nephrolithiasis.     Musculoskeletal  (+) Arthritis ,   Abdominal (+) - obese,   Peds  Hematology negative hematology ROS (+)   Anesthesia Other Findings Past Medical History: No date: Anxiety No date: Arthritis 2003: Breast cancer ([place])     Comment: LT LUMPECTOMY 2002: Cancer ([place])     Comment: breast cancer left No date: GERD (gastroesophageal reflux disease) No date: Hay fever No date: Hematuria     Comment: gross No date: Kidney stones 2003: Personal history of chemotherapy     Comment: BREAST CA 2003: Personal history of radiation therapy     Comment: BREAST CA No date: S/P chemotherapy, time since greater than 12 w*     Comment: left 2003 2003: S/P radiation therapy     Comment: left breast cancer No date: Shortness of breath dyspnea   Reproductive/Obstetrics                             Anesthesia Physical Anesthesia Plan  ASA: II  Anesthesia Plan: General   Post-op Pain Management:    Induction: Intravenous  Airway Management Planned: Oral ETT  Additional  Equipment:   Intra-op Plan:   Post-operative Plan: Extubation in OR  Informed Consent: I have reviewed the patients History and Physical, chart, labs and discussed the procedure including the risks, benefits and alternatives for the proposed anesthesia with the patient or authorized representative who has indicated his/her understanding and acceptance.   Dental advisory given  Plan Discussed with: CRNA and Anesthesiologist  Anesthesia Plan Comments:         Anesthesia Quick Evaluation

## 2016-03-23 NOTE — H&P (View-Only) (Signed)
02/27/2016 9:05 AM   Sarah Weeks [birth year] [id number]  Referring provider: Lucille Passy, MD [place], [place] [id number]  HPI: 60 -year-old Caucasian female who was initially referred for further workup of her episode of gross hematuria. She underwent further workup including CT urogram which showed several right-sided stones including a 14 mm stone. There was question of a soft tissue density possibly hemorrhage in the right lower pole vs. Papillary necrosis vs. Tumor.    Cystoscopy performed by Dr. Matilde Sprang on 01/20/2016 was unremarkable other than for some mild meatal stenosis.  She is a former smoker, with a 40 ppd history.  Quit 3 years ago.  She did undergo chemotherapy for breast cancer in 2002.    Prior to this, she has no history of kidney stones or flank pain.  She returns to the office today to discuss management of her stone as well right lower pole abnormality.   PMH: Past Medical History:  Diagnosis Date  . Arthritis   . Cancer [facility]) 2002   breast cancer left  . Hay fever   . Hematuria    gross  . Hypertension   . S/P chemotherapy, time since greater than 12 weeks    left 2003  . S/P radiation therapy 2003   left breast cancer    Surgical History: Past Surgical History:  Procedure Laterality Date  . ANKLE FUSION  2000  . BREAST EXCISIONAL BIOPSY Left    positive 2003  . BREAST LUMPECTOMY  2002   L side    Home Medications:    Medication List       Accurate as of 02/27/16 11:59 PM. Always use your most recent med list.          B COMPLEX PO Take by mouth. Reported on 11/27/2015   Biotin 7500 MCG Tabs Take 1 tablet by mouth daily. Reported on 11/27/2015   CALTRATE PLUS PO Take by mouth. Reported on 11/27/2015   Green Tea Extract 150 MG Caps Take by mouth. Reported on 11/27/2015   ibuprofen 200 MG tablet Commonly known as:  ADVIL,MOTRIN Take 600 mg by mouth 2 (two) times daily.   multivitamin tablet Take 2 tablets  by mouth daily. Reported on 11/27/2015   Vitamin D3 1000 units Caps Take 1 capsule by mouth. Reported on 11/27/2015   zolpidem 10 MG tablet Commonly known as:  AMBIEN Take 1 tablet (10 mg total) by mouth at bedtime as needed.       Allergies: No Known Allergies  Family History: Family History  Problem Relation Age of Onset  . Cancer Mother   . Breast cancer Mother 11  . Alcohol abuse Father   . Heart disease Father   . Hypertension Father   . Cancer Sister   . Heart disease Sister   . Breast cancer Sister 36  . Heart disease Brother   . Stroke Maternal Grandfather   . Diabetes Maternal Grandfather   . Kidney cancer Neg Hx   . Kidney disease Neg Hx   . Prostate cancer Neg Hx   . Bladder Cancer Neg Hx     Social History:  reports that she has quit smoking. She has never used smokeless tobacco. She reports that she drinks alcohol. She reports that she does not use drugs.  ROS: UROLOGY Frequent Urination?: No Hard to postpone urination?: No Burning/pain with urination?: No Get up at night to urinate?: No Leakage of urine?: No Urine stream starts and stops?: No  Trouble starting stream?: No Do you have to strain to urinate?: No Blood in urine?: No Urinary tract infection?: No Sexually transmitted disease?: No Injury to kidneys or bladder?: No Painful intercourse?: No Weak stream?: No Currently pregnant?: No Vaginal bleeding?: No Last menstrual period?: n  Gastrointestinal Nausea?: No Vomiting?: No Indigestion/heartburn?: No Diarrhea?: No Constipation?: No  Constitutional Fever: No Night sweats?: No Weight loss?: No Fatigue?: No  Skin Skin rash/lesions?: No Itching?: No  Eyes Blurred vision?: No Double vision?: No  Ears/Nose/Throat Sore throat?: No Sinus problems?: No  Hematologic/Lymphatic Swollen glands?: No Easy bruising?: No  Cardiovascular Leg swelling?: No Chest pain?: No  Respiratory Cough?: No Shortness of breath?:  No  Endocrine Excessive thirst?: No  Musculoskeletal Back pain?: No Joint pain?: No  Neurological Headaches?: No Dizziness?: No  Psychologic Depression?: No Anxiety?: No  Physical Exam: BP 121/87   Pulse (!) 105   Ht '5\' 2"'$  (1.575 m)   Wt 166 lb (75.3 kg)   BMI 30.36 kg/m   Physical Exam  Constitutional: She is oriented to person, place, and time. She appears well-developed and well-nourished.  HENT:  Head: Normocephalic and atraumatic.  Eyes: EOM are normal. Pupils are equal, round, and reactive to light.  Neck: Normal range of motion. Neck supple.  Cardiovascular: Normal rate and regular rhythm.   Pulmonary/Chest: Effort normal and breath sounds normal.  Abdominal: Soft. She exhibits no distension.  Musculoskeletal: Normal range of motion. She exhibits no edema.  Neurological: She is alert and oriented to person, place, and time.  Skin: Skin is warm and dry.  Psychiatric: She has a normal mood and affect.  Vitals reviewed.    Laboratory Data: Lab Results  Component Value Date   WBC 6.2 12/03/2014   HGB 12.5 12/03/2014   HCT 37.0 12/03/2014   MCV 98.1 12/03/2014   PLT 286.0 12/03/2014    Lab Results  Component Value Date   CREATININE 0.78 12/10/2015    Pertinent Imaging: CLINICAL DATA:  Painless gross hematuria x1.5 months. History of left breast cancer status post lumpectomy, chemotherapy, and radiation in 2002.  EXAM: CT ABDOMEN AND PELVIS WITHOUT AND WITH CONTRAST  TECHNIQUE: Multidetector CT imaging of the abdomen and pelvis was performed following the standard protocol before and following the bolus administration of intravenous contrast.  CONTRAST:  140m ISOVUE-300 IOPAMIDOL (ISOVUE-300) INJECTION 61%  COMPARISON:  None.  FINDINGS: Lower chest:  Lung bases are clear.  Hepatobiliary: Liver is within normal limits. No suspicious/enhancing hepatic lesions.  Gallbladder is unremarkable. No intrahepatic or extrahepatic  ductal dilatation.  Pancreas: Within normal limits.  Spleen: Within normal limits.  Adrenals/Urinary Tract: Adrenal glands are within normal limits.  Mildly heterogeneous perfusion in the lateral right upper kidney (series 6/image 20), without definite underlying cyst. No suspicious/enhancing renal lesions.  12 mm calculus in the right lower renal collecting system. Multiple additional nonobstructing right renal calculi measuring up to 3 mm.  No ureteral or bladder calculi. Mild fullness of the right renal collecting system without frank hydronephrosis.  Bladder is within normal limits.  Hyperdense filling defect within a right lower pole calyx on precontrast imaging (series 2/image 31), which persists on postcontrast imaging outlined by contrast (series 6/ image 29), but is relatively inconspicuous on delayed imaging (series 14/image 35). This appearance may reflect a small amount of hemorrhage within the collecting system. While urothelial lesion is difficult to exclude due to the relatively delayed phase of the postcontrast sequence, with contrast already in the collecting system, the lesion  shifts in position on prone imaging and therefore favors mobile debris/hemorrhage.  Subtle findings of calyceal blunting are suspected on delayed imaging, raising the possibility of papillary necrosis, although this remains equivocal.  Stomach/Bowel: Stomach is within normal limits.  No evidence of bowel obstruction.  Normal appendix (series 6/image 56).  Vascular/Lymphatic: No evidence of abdominal aortic aneurysm.  Atherosclerotic calcifications of the abdominal aorta and branch vessels.  No suspicious abdominopelvic lymphadenopathy.  Reproductive: Uterus is within normal limits.  Bilateral ovaries are within normal limits.  Other: No abdominopelvic ascites.  Musculoskeletal: Visualized osseous structures are within  normal limits.  IMPRESSION: Multiple right renal calculi measuring up to 12 mm in the right lower pole. No ureteral or bladder calculi.  Possible hemorrhage within a right lower pole calyx.  Possible mild calyceal blunting on the right, raising the possibility of papillary necrosis.   Electronically Signed   By: Julian Hy M.D.   On: 01/17/2016 13:25   CT scan personally reviewed today with the patient.  Assessment & Plan:    1. Gross hematuria S/p CT Urogram and cystoscopy.    2. Right nephrolithiasis Large 12 mm R stone.  Given the size of the stone, various options were discussed including continued observation, or treatment of the stone.  We discussed various treatment options including ESWL vs. ureteroscopy, laser lithotripsy, and stent. We discussed the risks and benefits of both including bleeding, infection, damage to surrounding structures, efficacy with need for possible further intervention, and need for temporary ureteral stent.  She is most interested in proceeding with right ureteroscopy. She understands the risks and benefits of this. In addition, we will be able to assess the right lower pole and biopsy/treat as needed. She is agreeable with this plan.  3. Right renal mass Filling defect within the right lower pole instantly identified on CT urogram. Suspect benign etiology, however, unable to completely rule out underlying malignancy without direct visualization. We will proceed with treatment of her right-sided stone and concomitantly evaluate the right lower pole at the same time.  Schedule R URS, LL, stent, possible right biopsy  Hollice Espy, MD  [facility] [address], [place], [place] [address] [phone]

## 2016-03-23 NOTE — Transfer of Care (Signed)
Immediate Anesthesia Transfer of Care Note  Patient: Sarah Weeks  Procedure(s) Performed: Procedure(s): URETEROSCOPY WITH HOLMIUM LASER LITHOTRIPSY (Right) CYSTOSCOPY WITH STENT PLACEMENT (Right)  Patient Location: PACU  Anesthesia Type:General  Level of Consciousness: sedated  Airway & Oxygen Therapy: Patient connected to face mask oxygen  Post-op Assessment: Post -op Vital signs reviewed and stable  Post vital signs: stable  Last Vitals:  Vitals:   03/23/16 0656 03/23/16 1009  BP: (!) 146/83 (!) 144/79  Pulse: 86 82  Resp: 16   Temp: 36.8 C 36.3 C    Last Pain:  Vitals:   03/23/16 1009  TempSrc: Temporal         Complications: No apparent anesthesia complications

## 2016-03-23 NOTE — Anesthesia Procedure Notes (Signed)
Procedure Name: Intubation Date/Time: 03/23/2016 7:54 AM Performed by: Aline Brochure Pre-anesthesia Checklist: Patient identified, Emergency Drugs available, Suction available and Patient being monitored Patient Re-evaluated:Patient Re-evaluated prior to inductionOxygen Delivery Method: Circle system utilized Preoxygenation: Pre-oxygenation with 100% oxygen Intubation Type: IV induction Ventilation: Mask ventilation without difficulty Laryngoscope Size: Mac and 3 Grade View: Grade I Tube type: Oral Tube size: 7.0 mm Number of attempts: 1 Airway Equipment and Method: Stylet Placement Confirmation: ETT inserted through vocal cords under direct vision,  positive ETCO2 and breath sounds checked- equal and bilateral Secured at: 20 cm Tube secured with: Tape Dental Injury: Teeth and Oropharynx as per pre-operative assessment

## 2016-03-23 NOTE — Discharge Instructions (Addendum)
You have a ureteral stent in place.  This is a tube that extends from your kidney to your bladder.  This may cause urinary bleeding, burning with urination, and urinary frequency.  Please call our office or present to the ED if you develop fevers >101 or pain which is not able to be controlled with oral pain medications.  You may be given either Flomax and/ or ditropan to help with bladder spasms and stent pain in addition to pain medications.    [facility] [address], [place], [place] [address] [phone]    AMBULATORY SURGERY  DISCHARGE INSTRUCTIONS   1) The drugs that you were given will stay in your system until tomorrow so for the next 24 hours you should not:  A) Drive an automobile B) Make any legal decisions C) Drink any alcoholic beverage   2) You may resume regular meals tomorrow.  Today it is better to start with liquids and gradually work up to solid foods.  You may eat anything you prefer, but it is better to start with liquids, then soup and crackers, and gradually work up to solid foods.   3) Please notify your doctor immediately if you have any unusual bleeding, trouble breathing, redness and pain at the surgery site, drainage, fever, or pain not relieved by medication.    4) Additional Instructions:        Please contact your physician with any problems or Same Day Surgery at [phone], Monday through Friday 6 am to 4 pm, or [place] at [facility] number at [phone].      AMBULATORY SURGERY  DISCHARGE INSTRUCTIONS   5) The drugs that you were given will stay in your system until tomorrow so for the next 24 hours you should not:  D) Drive an automobile E) Make any legal decisions F) Drink any alcoholic beverage   6) You may resume regular meals tomorrow.  Today it is better to start with liquids and gradually work up to solid foods.  You may eat anything you prefer, but it is better to start  with liquids, then soup and crackers, and gradually work up to solid foods.   7) Please notify your doctor immediately if you have any unusual bleeding, trouble breathing, redness and pain at the surgery site, drainage, fever, or pain not relieved by medication.    8) Additional Instructions:        Please contact your physician with any problems or Same Day Surgery at [phone], Monday through Friday 6 am to 4 pm, or [place] at [facility] number at [phone].

## 2016-03-23 NOTE — Anesthesia Postprocedure Evaluation (Signed)
Anesthesia Post Note  Patient: Sarah Weeks  Procedure(s) Performed: Procedure(s) (LRB): URETEROSCOPY WITH HOLMIUM LASER LITHOTRIPSY (Right) CYSTOSCOPY WITH STENT PLACEMENT (Right)  Patient location during evaluation: PACU Anesthesia Type: General Level of consciousness: awake and alert and oriented Pain management: pain level controlled Vital Signs Assessment: post-procedure vital signs reviewed and stable Respiratory status: spontaneous breathing, nonlabored ventilation and respiratory function stable Cardiovascular status: blood pressure returned to baseline and stable Postop Assessment: no signs of nausea or vomiting Anesthetic complications: no    Last Vitals:  Vitals:   03/23/16 1033 03/23/16 1039  BP:  134/83  Pulse: 83 80  Resp: 14 16  Temp:      Last Pain:  Vitals:   03/23/16 1033  TempSrc:   PainSc: 3                  Jagdeep Ancheta

## 2016-04-01 ENCOUNTER — Ambulatory Visit (INDEPENDENT_AMBULATORY_CARE_PROVIDER_SITE_OTHER): Payer: BLUE CROSS/BLUE SHIELD | Admitting: Family Medicine

## 2016-04-01 ENCOUNTER — Encounter: Payer: Self-pay | Admitting: Family Medicine

## 2016-04-01 VITALS — BP 116/70 | HR 87 | Temp 98.0°F | Wt 164.5 lb

## 2016-04-01 DIAGNOSIS — L659 Nonscarring hair loss, unspecified: Secondary | ICD-10-CM | POA: Diagnosis not present

## 2016-04-01 DIAGNOSIS — M25579 Pain in unspecified ankle and joints of unspecified foot: Secondary | ICD-10-CM | POA: Diagnosis not present

## 2016-04-01 DIAGNOSIS — Z23 Encounter for immunization: Secondary | ICD-10-CM | POA: Diagnosis not present

## 2016-04-01 LAB — COMPREHENSIVE METABOLIC PANEL
ALK PHOS: 96 U/L (ref 39–117)
ALT: 25 U/L (ref 0–35)
AST: 21 U/L (ref 0–37)
Albumin: 4.4 g/dL (ref 3.5–5.2)
BUN: 20 mg/dL (ref 6–23)
CO2: 30 meq/L (ref 19–32)
Calcium: 10 mg/dL (ref 8.4–10.5)
Chloride: 103 mEq/L (ref 96–112)
Creatinine, Ser: 0.79 mg/dL (ref 0.40–1.20)
GFR: 78.95 mL/min (ref >60.00)
GLUCOSE: 101 mg/dL — AB (ref 70–99)
POTASSIUM: 5 meq/L (ref 3.5–5.1)
SODIUM: 140 meq/L (ref 135–145)
TOTAL PROTEIN: 7.6 g/dL (ref 6.0–8.3)
Total Bilirubin: 0.4 mg/dL (ref 0.2–1.2)

## 2016-04-01 LAB — LIPID PANEL
CHOL/HDL RATIO: 3
Cholesterol: 252 mg/dL — ABNORMAL HIGH (ref 0–200)
HDL: 81.2 mg/dL (ref >39.00)
LDL Cholesterol: 154 mg/dL — ABNORMAL HIGH (ref 0–99)
NONHDL: 171.25
Triglycerides: 88 mg/dL (ref 0.0–149.0)
VLDL: 17.6 mg/dL (ref 0.0–40.0)

## 2016-04-01 LAB — T3, FREE: T3 FREE: 3.3 pg/mL (ref 2.3–4.2)

## 2016-04-01 LAB — T4, FREE: FREE T4: 0.89 ng/dL (ref 0.60–1.60)

## 2016-04-01 LAB — TSH: TSH: 0.54 u[IU]/mL (ref 0.35–4.50)

## 2016-04-01 NOTE — Progress Notes (Signed)
Subjective:   Patient ID: Sarah Weeks, female    DOB: 1956-06-03, 60 y.o.   MRN: [id number]  Sarah Weeks is a pleasant 60 y.o. year old female who presents to clinic today with Ankle Pain (left) and Follow-up (thyroid)  on 04/01/2016  HPI:  History of ankle issues.  Had to have her right ankle operated on years ago.  On her feet all day.  Now her left ankle is bothering her.  No known injury.  She has been wearing an ankle brace intermittently.  She wants to know if her thyroid function is ok.  Hair is still thinning.  Also feels more fatigued lately.  Lab Results  Component Value Date   TSH 0.60 12/03/2014       Current Outpatient Prescriptions on File Prior to Visit  Medication Sig Dispense Refill  . B Complex Vitamins (B COMPLEX PO) Take by mouth. Reported on 11/27/2015    . BIOTIN PO Take 10,000 mcg by mouth daily.    . Cholecalciferol (VITAMIN D3) 5000 units CAPS Take 1 capsule by mouth daily.    . Flaxseed, Linseed, (FLAX SEED OIL) 1000 MG CAPS Take 1 capsule by mouth daily.    Nyoka Cowden Tea, Camillia sinensis, (GREEN TEA EXTRACT PO) Take 400 mg by mouth daily.    Marland Kitchen ibuprofen (ADVIL,MOTRIN) 200 MG tablet Take 600 mg by mouth 2 (two) times daily as needed.     . Multiple Vitamin (MULTIVITAMIN) tablet Take 2 tablets by mouth daily. Reported on 11/27/2015    . oxybutynin (DITROPAN) 5 MG tablet Take 1 tablet (5 mg total) by mouth every 8 (eight) hours as needed for bladder spasms. 30 tablet 0  . tamsulosin (FLOMAX) 0.4 MG CAPS capsule Take 1 capsule (0.4 mg total) by mouth daily. 30 capsule 0  . zolpidem (AMBIEN) 10 MG tablet Take 1 tablet (10 mg total) by mouth at bedtime as needed. 30 tablet 0   No current facility-administered medications on file prior to visit.     No Known Allergies  Past Medical History:  Diagnosis Date  . Anxiety   . Arthritis   . Breast cancer ([place]) 2003   LT LUMPECTOMY  . Cancer [facility]) 2002   breast cancer left  . GERD  (gastroesophageal reflux disease)   . Hay fever   . Hematuria    gross  . Kidney stones   . Personal history of chemotherapy 2003   BREAST CA  . Personal history of radiation therapy 2003   BREAST CA  . S/P chemotherapy, time since greater than 12 weeks    left 2003  . S/P radiation therapy 2003   left breast cancer  . Shortness of breath dyspnea     Past Surgical History:  Procedure Laterality Date  . ANKLE FUSION  2000  . BREAST EXCISIONAL BIOPSY Left 2003   positive  . BREAST LUMPECTOMY  2002   L side  . CYSTOSCOPY WITH STENT PLACEMENT Right 03/23/2016   Procedure: CYSTOSCOPY WITH STENT PLACEMENT;  Surgeon: Hollice Espy, MD;  Location: [facility] ORS;  Service: Urology;  Laterality: Right;  . URETEROSCOPY WITH HOLMIUM LASER LITHOTRIPSY Right 03/23/2016   Procedure: URETEROSCOPY WITH HOLMIUM LASER LITHOTRIPSY;  Surgeon: Hollice Espy, MD;  Location: [facility] ORS;  Service: Urology;  Laterality: Right;    Family History  Problem Relation Age of Onset  . Cancer Mother   . Breast cancer Mother 27  . Alcohol abuse Father   . Heart disease Father   .  Hypertension Father   . Cancer Sister   . Heart disease Sister   . Breast cancer Sister 70  . Heart disease Brother   . Stroke Maternal Grandfather   . Diabetes Maternal Grandfather   . Kidney cancer Neg Hx   . Kidney disease Neg Hx   . Prostate cancer Neg Hx   . Bladder Cancer Neg Hx     Social History   Social History  . Marital status: Divorced    Spouse name: N/A  . Number of children: N/A  . Years of education: N/A   Occupational History  . Not on file.   Social History Main Topics  . Smoking status: Former Smoker    Quit date: 03/12/2013  . Smokeless tobacco: Never Used  . Alcohol use 8.4 oz/week    14 Shots of liquor per week     Comment: occasional  . Drug use: No  . Sexual activity: No   Other Topics Concern  . Not on file   Social History Narrative  . No narrative on file   The PMH, PSH, Social  History, Family History, Medications, and allergies have been reviewed in [facility], and have been updated if relevant.   Review of Systems  Constitutional: Positive for fatigue. Negative for unexpected weight change.  Eyes: Negative.   Respiratory: Negative.   Cardiovascular: Negative.   Gastrointestinal: Negative.   Genitourinary: Negative.   Musculoskeletal: Negative.   Neurological: Negative.   Psychiatric/Behavioral: Negative.   All other systems reviewed and are negative.      Objective:    BP 116/70   Pulse 87   Temp 98 F (36.7 C) (Oral)   Wt 164 lb 8 oz (74.6 kg)   SpO2 100%   BMI 30.09 kg/m  Wt Readings from Last 3 Encounters:  04/01/16 164 lb 8 oz (74.6 kg)  03/23/16 166 lb (75.3 kg)  03/12/16 166 lb (75.3 kg)     Physical Exam   General:  Well-developed,well-nourished,in no acute distress; alert,appropriate and cooperative throughout examination Head:  normocephalic and atraumatic.   Eyes:  vision grossly intact, pupils equal, pupils round, and pupils reactive to light.   Ears:  R ear normal and L ear normal.   Nose:  no external deformity.   Mouth:  good dentition.   Neck:  No deformities, masses, or tenderness noted. Lungs:  Normal respiratory effort, chest expands symmetrically. Lungs are clear to auscultation, no crackles or wheezes. Heart:  Normal rate and regular rhythm. S1 and S2 normal without gallop, murmur, click, rub or other extra sounds. Neurologic:  alert & oriented X3 and gait normal.   Skin:  Intact without suspicious lesions or rashes Psych:  Cognition and judgment appear intact. Alert and cooperative with normal attention span and concentration. No apparent delusions, illusions, hallucinations       Assessment & Plan:   Pain in joint involving ankle and foot, unspecified laterality - Plan: Ambulatory referral to Orthopedic Surgery  Hair loss - Plan: TSH, T4, Free, T3, Free, Lipid panel, Comprehensive metabolic panel No Follow-up on  file.

## 2016-04-01 NOTE — Assessment & Plan Note (Signed)
Check thyroid function today.

## 2016-04-01 NOTE — Assessment & Plan Note (Signed)
Given history of ankle surgery, refer to ortho without doing imaging here. The patient indicates understanding of these issues and agrees with the plan.

## 2016-04-01 NOTE — Progress Notes (Signed)
Pre visit review using our clinic review tool, if applicable. No additional management support is needed unless otherwise documented below in the visit note. 

## 2016-04-01 NOTE — Addendum Note (Signed)
Addended by: Modena Nunnery on: 04/01/2016 11:05 AM   Modules accepted: Orders

## 2016-04-02 ENCOUNTER — Encounter: Payer: Self-pay | Admitting: *Deleted

## 2016-04-03 ENCOUNTER — Other Ambulatory Visit: Payer: BLUE CROSS/BLUE SHIELD | Admitting: Urology

## 2016-04-03 ENCOUNTER — Ambulatory Visit (INDEPENDENT_AMBULATORY_CARE_PROVIDER_SITE_OTHER): Payer: BLUE CROSS/BLUE SHIELD | Admitting: Urology

## 2016-04-03 ENCOUNTER — Encounter: Payer: Self-pay | Admitting: Urology

## 2016-04-03 VITALS — BP 146/91 | HR 93 | Ht 62.0 in | Wt 167.5 lb

## 2016-04-03 DIAGNOSIS — N135 Crossing vessel and stricture of ureter without hydronephrosis: Secondary | ICD-10-CM

## 2016-04-03 DIAGNOSIS — N200 Calculus of kidney: Secondary | ICD-10-CM | POA: Diagnosis not present

## 2016-04-03 LAB — URINALYSIS, COMPLETE
Bilirubin, UA: NEGATIVE
GLUCOSE, UA: NEGATIVE
Ketones, UA: NEGATIVE
NITRITE UA: NEGATIVE
Urobilinogen, Ur: 0.2 mg/dL (ref 0.2–1.0)
pH, UA: 5.5 (ref 5.0–7.5)

## 2016-04-03 LAB — STONE ANALYSIS
CA HYDROGEN PHOS.: 5 %
CA OXALATE, DIHYDRATE: 20 %
CA OXALATE, MONOHYDR.: 72 %
CA PHOS CRY STONE QL IR: 3 %
Stone Weight KSTONE: 21 mg

## 2016-04-03 LAB — MICROSCOPIC EXAMINATION: WBC, UA: >30 /hpf — AB (ref 0–?)

## 2016-04-03 NOTE — Progress Notes (Signed)
   04/03/16  CC:  Chief Complaint  Patient presents with  . Cysto Stent Removal    HPI: 60 year old female who underwent right ureteroscopy, laser lithotripsy on 03/23/2016 for a 12 mm right lower pole stone along with other nonobstructing stones. There is questionable filling defect within this kidney but no was appreciated and ureteroscopy. 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 She returns to the office today versus SP, stent removal.  She is anxious to have her stent removed today. It is causing some bladder irritation but otherwise she has no complaints. No fevers or chills. No gross hematuria.  Blood pressure (!) 146/91, pulse 93, height '5\' 2"'$  (1.575 m), weight 167 lb 8 oz (76 kg). NED. A&Ox3.   No respiratory distress   Abd soft, NT, ND Normal external genitalia with patent urethral meatus  Cystoscopy Procedure Note  Patient identification was confirmed, informed consent was obtained, and patient was prepped using Betadine solution.  Lidocaine jelly was administered per urethral meatus.    Preoperative abx where received prior to procedure.    Procedure: - Flexible cystoscope introduced, without any difficulty.   - Thorough search of the bladder revealed:    normal urethral meatus    normal urothelium    no stones    no ulcers     no tumors    no urethral polyps    no trabeculation  - Ureteral orifices were normal in position and appearance.  Post-Procedure: - Patient tolerated the procedure well  Assessment/ Plan:  1. Kidney stone Status post on-call uncomplicated stent removal today Follow-up in 4 weeks with renal ultrasound prior Warning symptoms are reviewed  2.  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   Hollice Espy, MD

## 2016-04-06 ENCOUNTER — Telehealth: Payer: Self-pay

## 2016-04-06 NOTE — Telephone Encounter (Signed)
Have we sent her information on low cholesterol diet?

## 2016-04-06 NOTE — Telephone Encounter (Signed)
Please call pt.  Sorry it has been such a busy morning. I do not think biotin has impacted her cholesterol as far as I know.  Have there been any changes in her diet?

## 2016-04-06 NOTE — Telephone Encounter (Signed)
Pt has a question about her lab work she said she hasn't been taking biotin for over a month and wants to know if that would affect her results.  CB # [id number]. Thanks.

## 2016-04-06 NOTE — Telephone Encounter (Signed)
Spoke to pt and advised per Dr Deborra Medina. States she has not made any dietary changes

## 2016-04-07 NOTE — Telephone Encounter (Signed)
I did. It is more than likely on its way to her via mail

## 2016-04-08 ENCOUNTER — Other Ambulatory Visit: Payer: Self-pay

## 2016-04-08 ENCOUNTER — Telehealth: Payer: Self-pay

## 2016-04-08 MED ORDER — ZOLPIDEM TARTRATE 10 MG PO TABS: 10.0000 mg | ORAL_TABLET | Freq: Every evening | ORAL | 0 refills | Status: DC | PRN

## 2016-04-08 NOTE — Telephone Encounter (Signed)
Rx called in to requested pharmacy 

## 2016-04-08 NOTE — Telephone Encounter (Signed)
Pt left v/m requesting refill zolpidem to [facility] [address]. Last refilled # 30 on 03/10/16. Last seen for insomnia on 09/05/15. Last seen for acute visit 04/01/16.

## 2016-04-08 NOTE — Telephone Encounter (Signed)
Pt left v/m; pt waiting to hear back from talking with [facility] CMA about thyroid T4; pt thought Earl Lagos was going to speak with Dr Deborra Medina and call her back. Pt request cb.

## 2016-04-09 NOTE — Telephone Encounter (Signed)
Spoke to pt and discussed results. States she confused elevated lipids with T4. States she has received lipid lowering information

## 2016-04-10 MED ORDER — ZOLPIDEM TARTRATE 10 MG PO TABS: 10.0000 mg | ORAL_TABLET | Freq: Every evening | ORAL | 0 refills | Status: DC | PRN

## 2016-04-10 NOTE — Addendum Note (Signed)
Addended by: Helene Shoe on: 04/10/2016 03:23 PM   Modules accepted: Orders

## 2016-04-10 NOTE — Telephone Encounter (Signed)
Pt left v/m[facility] had not responded to refill request for zolpidem. Per phone note on 04/08/16 zolpidem refill was called on 04/08/16 at 4:29. Medication phoned to [facility] [place] as instructed. I tried to call pt and per DPR left detailed v/m that pt should ck with [facility] [address]

## 2016-04-27 ENCOUNTER — Ambulatory Visit
Admission: RE | Admit: 2016-04-27 | Discharge: 2016-04-27 | Disposition: A | Discharge status: Home / Self Care | Payer: BLUE CROSS/BLUE SHIELD | Source: Ambulatory Visit | Attending: Urology | Admitting: Urology

## 2016-04-27 DIAGNOSIS — N200 Calculus of kidney: Secondary | ICD-10-CM | POA: Diagnosis not present

## 2016-05-01 ENCOUNTER — Encounter: Payer: Self-pay | Admitting: Urology

## 2016-05-01 ENCOUNTER — Ambulatory Visit (INDEPENDENT_AMBULATORY_CARE_PROVIDER_SITE_OTHER): Payer: BLUE CROSS/BLUE SHIELD | Admitting: Urology

## 2016-05-01 VITALS — BP 162/100 | HR 94 | Ht 62.0 in | Wt 168.6 lb

## 2016-05-01 DIAGNOSIS — N200 Calculus of kidney: Secondary | ICD-10-CM | POA: Diagnosis not present

## 2016-05-01 NOTE — Patient Instructions (Signed)
Dietary Guidelines to Help Prevent Kidney Stones Your risk of kidney stones can be decreased by adjusting the foods you eat. The most important thing you can do is drink enough fluid. You should drink enough fluid to keep your urine clear or pale yellow. The following guidelines provide specific information for the type of kidney stone you have had. Guidelines according to type of kidney stone Calcium Oxalate Kidney Stones  Reduce the amount of salt you eat. Foods that have a lot of salt cause your body to release excess calcium into your urine. The excess calcium can combine with a substance called oxalate to form kidney stones.  Reduce the amount of animal protein you eat if the amount you eat is excessive. Animal protein causes your body to release excess calcium into your urine. Ask your dietitian how much protein from animal sources you should be eating.  Avoid foods that are high in oxalates. If you take vitamins, they should have less than 500 mg of vitamin C. Your body turns vitamin C into oxalates. You do not need to avoid fruits and vegetables high in vitamin C. Calcium Phosphate Kidney Stones  Reduce the amount of salt you eat to help prevent the release of excess calcium into your urine.  Reduce the amount of animal protein you eat if the amount you eat is excessive. Animal protein causes your body to release excess calcium into your urine. Ask your dietitian how much protein from animal sources you should be eating.  Get enough calcium from food or take a calcium supplement (ask your dietitian for recommendations). Food sources of calcium that do not increase your risk of kidney stones include:  Broccoli.  Dairy products, such as cheese and yogurt.  Pudding. Uric Acid Kidney Stones  Do not have more than 6 oz of animal protein per day. Food sources Animal Protein Sources  Meat (all types).  Poultry.  Eggs.  Fish, seafood. Foods High in Salt  Salt seasonings.  Soy  sauce.  Teriyaki sauce.  Cured and processed meats.  Salted crackers and snack foods.  Fast food.  Canned soups and most canned foods. Foods High in Oxalates  Grains:  Amaranth.  Barley.  Grits.  Wheat germ.  Bran.  Buckwheat flour.  All bran cereals.  Pretzels.  Whole wheat bread.  Vegetables:  Beans (wax).  Beets and beet greens.  Collard greens.  Eggplant.  Escarole.  Leeks.  Okra.  Parsley.  Rutabagas.  Spinach.  Swiss chard.  Tomato paste.  Fried potatoes.  Sweet potatoes.  Fruits:  Red currants.  Figs.  Kiwi.  Rhubarb.  Meat and Other Protein Sources:  Beans (dried).  Soy burgers and other soybean products.  Miso.  Nuts (peanuts, almonds, pecans, cashews, hazelnuts).  Nut butters.  Sesame seeds and tahini (paste made of sesame seeds).  Poppy seeds.  Beverages:  Chocolate drink mixes.  Soy milk.  Instant iced tea.  Juices made from high-oxalate fruits or vegetables.  Other:  Carob.  Chocolate.  Fruitcake.  Marmalades. This information is not intended to replace advice given to you by your health care provider. Make sure you discuss any questions you have with your health care provider. Document Released: 09/26/2010 Document Revised: 11/07/2015 Document Reviewed: 04/28/2013 Elsevier Interactive Patient Education  2017 Elsevier Inc.  

## 2016-05-03 NOTE — Progress Notes (Signed)
05/01/2016 3:23 PM   Sarah Weeks 06-Jan-1956 [id number]  Referring provider: Lucille Passy, MD [address] [place], [place] [address]  Chief Complaint  Patient presents with  . Follow-up    RUS results    HPI: 60 year old female who underwent right ureteroscopy, laser lithotripsy on 03/23/2016 for a 12 mm right lower pole stone along with other nonobstructing stones. There is questionable filling defect within this kidney but no was appreciated and ureteroscopy. 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  She underwent cystoscopy, stent removal on 04/03/2016. She's had no issues following this procedure.  Follow up renal ultrasound shows no hydronephrosis. There is some shadowing in the right kidney consistent with stone debris.  No flank pain or gross hematuria. She has a history of stones or flank pain prior to this.  Stone analysis shows calcium oxalate dihydrate 20%, calcium oxalate monohydrate 72%, calcium phosphate 2%, calcium hydrogen phosphate 5%.  Patient does admit to eating too much to not drinking enough water, especially during the cold months.  PMH: Past Medical History:  Diagnosis Date  . Anxiety   . Arthritis   . Breast cancer ([place]) 2003   LT LUMPECTOMY  . Cancer [facility]) 2002   breast cancer left  . GERD (gastroesophageal reflux disease)   . Hay fever   . Hematuria    gross  . Kidney stones   . Personal history of chemotherapy 2003   BREAST CA  . Personal history of radiation therapy 2003   BREAST CA  . S/P chemotherapy, time since greater than 12 weeks    left 2003  . S/P radiation therapy 2003   left breast cancer  . Shortness of breath dyspnea     Surgical History: Past Surgical History:  Procedure Laterality Date  . ANKLE FUSION  2000  . BREAST EXCISIONAL BIOPSY Left 2003   positive  . BREAST LUMPECTOMY  2002   L side  . CYSTOSCOPY WITH STENT PLACEMENT Right 03/23/2016   Procedure: CYSTOSCOPY WITH  STENT PLACEMENT;  Surgeon: Hollice Espy, MD;  Location: [facility] ORS;  Service: Urology;  Laterality: Right;  . URETEROSCOPY WITH HOLMIUM LASER LITHOTRIPSY Right 03/23/2016   Procedure: URETEROSCOPY WITH HOLMIUM LASER LITHOTRIPSY;  Surgeon: Hollice Espy, MD;  Location: [facility] ORS;  Service: Urology;  Laterality: Right;    Home Medications:    Medication List       Accurate as of 05/01/16 11:59 PM. Always use your most recent med list.          B COMPLEX PO Take by mouth. Reported on 11/27/2015   BIOTIN PO Take 10,000 mcg by mouth daily.   Flax Seed Oil 1000 MG Caps Take 1 capsule by mouth daily.   GREEN TEA EXTRACT PO Take 400 mg by mouth daily.   ibuprofen 200 MG tablet Commonly known as:  ADVIL,MOTRIN Take 600 mg by mouth 2 (two) times daily as needed.   multivitamin tablet Take 2 tablets by mouth daily. Reported on 11/27/2015   oxybutynin 5 MG tablet Commonly known as:  DITROPAN Take 1 tablet (5 mg total) by mouth every 8 (eight) hours as needed for bladder spasms.   tamsulosin 0.4 MG Caps capsule Commonly known as:  FLOMAX Take 1 capsule (0.4 mg total) by mouth daily.   Vitamin D3 5000 units Caps Take 1 capsule by mouth daily.   zolpidem 10 MG tablet Commonly known as:  AMBIEN Take 1 tablet (10 mg total) by mouth  at bedtime as needed.       Allergies: No Known Allergies  Family History: Family History  Problem Relation Age of Onset  . Cancer Mother   . Breast cancer Mother 21  . Alcohol abuse Father   . Heart disease Father   . Hypertension Father   . Cancer Sister   . Heart disease Sister   . Breast cancer Sister 12  . Heart disease Brother   . Stroke Maternal Grandfather   . Diabetes Maternal Grandfather   . Kidney cancer Neg Hx   . Kidney disease Neg Hx   . Prostate cancer Neg Hx   . Bladder Cancer Neg Hx     Social History:  reports that she quit smoking about 3 years ago. She has never used smokeless tobacco. She reports that she drinks  about 8.4 oz of alcohol per week . She reports that she does not use drugs.  ROS: UROLOGY Frequent Urination?: No Hard to postpone urination?: No Burning/pain with urination?: No Get up at night to urinate?: No Leakage of urine?: No Urine stream starts and stops?: No Trouble starting stream?: No Do you have to strain to urinate?: No Blood in urine?: No Urinary tract infection?: No Sexually transmitted disease?: No Injury to kidneys or bladder?: No Painful intercourse?: No Weak stream?: No Currently pregnant?: No Vaginal bleeding?: No Last menstrual period?: n  Gastrointestinal Nausea?: No Vomiting?: No Indigestion/heartburn?: No Diarrhea?: No Constipation?: No  Constitutional Fever: No Night sweats?: No Weight loss?: No Fatigue?: No  Skin Skin rash/lesions?: No Itching?: No  Eyes Blurred vision?: No Double vision?: No  Ears/Nose/Throat Sore throat?: No Sinus problems?: No  Hematologic/Lymphatic Swollen glands?: No Easy bruising?: No  Cardiovascular Leg swelling?: No Chest pain?: No  Respiratory Cough?: No Shortness of breath?: No  Endocrine Excessive thirst?: No  Musculoskeletal Back pain?: No Joint pain?: No  Neurological Headaches?: No Dizziness?: No  Psychologic Depression?: No Anxiety?: No  Physical Exam: BP (!) 162/100 (BP Location: Left Arm, Patient Position: Sitting, Cuff Size: Normal)   Pulse 94   Ht '5\' 2"'$  (1.575 m)   Wt 168 lb 9.6 oz (76.5 kg)   BMI 30.84 kg/m   Constitutional:  Alert and oriented, No acute distress. HEENT: [place] AT, moist mucus membranes.  Trachea midline, no masses. Cardiovascular: No clubbing, cyanosis, or edema. Respiratory: Normal respiratory effort, no increased work of breathing. GI: Abdomen is soft, nontender, nondistended, no abdominal masses GU: No CVA tenderness.  Skin: No rashes, bruises or suspicious lesions. Neurologic: Grossly intact, no focal deficits, moving all 4 extremities. Psychiatric:  Normal mood and affect.  Laboratory Data: Lab Results  Component Value Date   WBC 5.7 03/12/2016   HGB 13.4 03/12/2016   HCT 38.3 03/12/2016   MCV 98.6 03/12/2016   PLT 242 03/12/2016    Lab Results  Component Value Date   CREATININE 0.79 04/01/2016    Urinalysis    Component Value Date/Time   APPEARANCEUR Cloudy (A) 04/03/2016 1019   GLUCOSEU Negative 04/03/2016 1019   BILIRUBINUR Negative 04/03/2016 1019   PROTEINUR 3+ (A) 04/03/2016 1019   UROBILINOGEN 0.2 11/27/2015 1036   NITRITE Negative 04/03/2016 1019   LEUKOCYTESUR 2+ (A) 04/03/2016 1019    Pertinent Imaging: CLINICAL DATA:  Right kidney stone status post right ureteroscopy.  EXAM: RENAL / URINARY TRACT ULTRASOUND COMPLETE  COMPARISON:  CT abdomen and pelvis 01/17/2016  FINDINGS: Right Kidney:  Length: 10.0 cm. Echogenicity within normal limits. No mass or hydronephrosis visualized. 5.5 mm  echogenic focus with posterior shadowing in the lower pole suggestive of a small stone. Additional more punctate echogenic foci in the lower pole without as clearly demonstrable shadowing, small stones versus artifact.  Left Kidney:  Length: 10.8 cm. Echogenicity within normal limits. No mass or hydronephrosis visualized.  Bladder:  Appears normal for degree of bladder distention. Bilateral ureteral jets were visualized.  IMPRESSION: Right nephrolithiasis.  No hydronephrosis.   Electronically Signed   By: Logan Bores M.D.   On: 04/27/2016 14:26  Renal ultrasound procedure patient with the patient.  Assessment & Plan:    1. Right kidney stone No residual hydronephrosis on renal ultrasound, limited concern for UPJ based on absence of symptoms and residual hydronephrosis Stone analysis review that the patient We discussed general stone prevention techniques including drinking plenty water with goal of producing 2.5 L urine daily, increased citric acid intake, avoidance of high oxalate  containing foods, and decreased salt intake.  Information about dietary recommendations given today This is her first stone, no metabolic workup is warranted at this time.  Plan for return in 6 months with a KUB to assess for any newly formed stone burden - DG Abd 1 View; Future   Return in about 6 months (around 10/29/2016) for KUB.  Hollice Espy, MD  [facility] [address], [place], [place] [address] [phone]

## 2016-05-11 ENCOUNTER — Other Ambulatory Visit: Payer: Self-pay

## 2016-05-11 MED ORDER — ZOLPIDEM TARTRATE 10 MG PO TABS: 10.0000 mg | ORAL_TABLET | Freq: Every evening | ORAL | 0 refills | Status: DC | PRN

## 2016-05-11 NOTE — Telephone Encounter (Signed)
Pt left v/m requesting refill ambien to [facility] [address]. Pt last seen 04/01/16; last refilled # 30 on 04/10/16.

## 2016-05-11 NOTE — Telephone Encounter (Signed)
Rx called in to requested pharmacy 

## 2016-06-11 ENCOUNTER — Other Ambulatory Visit: Payer: Self-pay

## 2016-06-11 MED ORDER — ZOLPIDEM TARTRATE 10 MG PO TABS: 10.0000 mg | ORAL_TABLET | Freq: Every evening | ORAL | 0 refills | Status: DC | PRN

## 2016-06-11 NOTE — Telephone Encounter (Signed)
Pt left v/m requesting refill ambien to [facility] [address] Last refilled # 30 on 05/11/16. Pt last seen 04/01/16.

## 2016-06-12 NOTE — Telephone Encounter (Signed)
Rx called in to requested pharmacy 

## 2016-07-10 ENCOUNTER — Other Ambulatory Visit: Payer: Self-pay

## 2016-07-10 MED ORDER — ZOLPIDEM TARTRATE 10 MG PO TABS: 10.0000 mg | ORAL_TABLET | Freq: Every evening | ORAL | 0 refills | Status: DC | PRN

## 2016-07-10 NOTE — Telephone Encounter (Signed)
Rx called in to requested pharmacy 

## 2016-07-10 NOTE — Telephone Encounter (Signed)
Pt left /vm requesting refill ambien to [facility] [address]. Last refilled # 30 on 06/11/16. Last seen 04/01/16.Please advise.

## 2016-09-10 ENCOUNTER — Other Ambulatory Visit: Payer: Self-pay | Admitting: *Deleted

## 2016-09-10 MED ORDER — ZOLPIDEM TARTRATE 10 MG PO TABS: 10.0000 mg | ORAL_TABLET | Freq: Every evening | ORAL | 0 refills | Status: DC | PRN

## 2016-09-10 NOTE — Telephone Encounter (Signed)
Last Rx 06/2016. Last OV 03/2016-acute

## 2016-09-10 NOTE — Telephone Encounter (Signed)
Called Rx to [facility]

## 2016-10-30 ENCOUNTER — Ambulatory Visit: Payer: BLUE CROSS/BLUE SHIELD | Admitting: Urology

## 2016-11-12 ENCOUNTER — Other Ambulatory Visit: Payer: Self-pay

## 2016-11-12 MED ORDER — ZOLPIDEM TARTRATE 10 MG PO TABS: 10.0000 mg | ORAL_TABLET | Freq: Every evening | ORAL | 0 refills | Status: DC | PRN

## 2016-11-12 NOTE — Telephone Encounter (Signed)
Called in to [place], [place]: [phone]

## 2016-11-12 NOTE — Telephone Encounter (Signed)
Pt left v/m requesting refill ambien to [facility] [address] Last refilled # 30 on 09/10/16 and last f/u 04/01/16.

## 2016-12-07 ENCOUNTER — Ambulatory Visit (INDEPENDENT_AMBULATORY_CARE_PROVIDER_SITE_OTHER): Payer: BLUE CROSS/BLUE SHIELD | Admitting: Family Medicine

## 2016-12-07 ENCOUNTER — Telehealth: Payer: Self-pay | Admitting: Family Medicine

## 2016-12-07 ENCOUNTER — Encounter: Payer: Self-pay | Admitting: Family Medicine

## 2016-12-07 VITALS — BP 152/98 | HR 89 | Temp 98.3°F | Wt 155.5 lb

## 2016-12-07 DIAGNOSIS — J029 Acute pharyngitis, unspecified: Secondary | ICD-10-CM | POA: Diagnosis not present

## 2016-12-07 DIAGNOSIS — R150 Incomplete defecation: Secondary | ICD-10-CM

## 2016-12-07 LAB — POCT RAPID STREP A (OFFICE): RAPID STREP A SCREEN: POSITIVE — AB

## 2016-12-07 MED ORDER — PENICILLIN V POTASSIUM 500 MG PO TABS: 500.0000 mg | ORAL_TABLET | Freq: Three times a day (TID) | ORAL | 0 refills | Status: DC

## 2016-12-07 NOTE — Telephone Encounter (Signed)
Having some fecal incontinence since last year.  Colonoscopy 10/2013.  Will refer to GI for further evaluation.

## 2016-12-07 NOTE — Patient Instructions (Signed)
Strep Throat Strep throat is a bacterial infection of the throat. Your health care provider may call the infection tonsillitis or pharyngitis, depending on whether there is swelling in the tonsils or at the back of the throat. Strep throat is most common during the cold months of the year in children who are 5-61 years of age, but it can happen during any season in people of any age. This infection is spread from person to person (contagious) through coughing, sneezing, or close contact. What are the causes? Strep throat is caused by the bacteria called Streptococcus pyogenes. What increases the risk? This condition is more likely to develop in:  People who spend time in crowded places where the infection can spread easily.  People who have close contact with someone who has strep throat.  What are the signs or symptoms? Symptoms of this condition include:  Fever or chills.  Redness, swelling, or pain in the tonsils or throat.  Pain or difficulty when swallowing.  White or yellow spots on the tonsils or throat.  Swollen, tender glands in the neck or under the jaw.  Red rash all over the body (rare).  How is this diagnosed? This condition is diagnosed by performing a rapid strep test or by taking a swab of your throat (throat culture test). Results from a rapid strep test are usually ready in a few minutes, but throat culture test results are available after one or two days. How is this treated? This condition is treated with antibiotic medicine. Follow these instructions at home: Medicines  Take over-the-counter and prescription medicines only as told by your health care provider.  Take your antibiotic as told by your health care provider. Do not stop taking the antibiotic even if you start to feel better.  Have family members who also have a sore throat or fever tested for strep throat. They may need antibiotics if they have the strep infection. Eating and drinking  Do not  share food, drinking cups, or personal items that could cause the infection to spread to other people.  If swallowing is difficult, try eating soft foods until your sore throat feels better.  Drink enough fluid to keep your urine clear or pale yellow. General instructions  Gargle with a salt-water mixture 3-4 times per day or as needed. To make a salt-water mixture, completely dissolve -1 tsp of salt in 1 cup of warm water.  Make sure that all household members wash their hands well.  Get plenty of rest.  Stay home from school or work until you have been taking antibiotics for 24 hours.  Keep all follow-up visits as told by your health care provider. This is important. Contact a health care provider if:  The glands in your neck continue to get bigger.  You develop a rash, cough, or earache.  You cough up a thick liquid that is green, yellow-brown, or bloody.  You have pain or discomfort that does not get better with medicine.  Your problems seem to be getting worse rather than better.  You have a fever. Get help right away if:  You have new symptoms, such as vomiting, severe headache, stiff or painful neck, chest pain, or shortness of breath.  You have severe throat pain, drooling, or changes in your voice.  You have swelling of the neck, or the skin on the neck becomes red and tender.  You have signs of dehydration, such as fatigue, dry mouth, and decreased urination.  You become increasingly sleepy, or   you cannot wake up completely.  Your joints become red or painful. This information is not intended to replace advice given to you by your health care provider. Make sure you discuss any questions you have with your health care provider. Document Released: 05/29/2000 Document Revised: 01/29/2016 Document Reviewed: 09/24/2014 Elsevier Interactive Patient Education  2017 Elsevier Inc.  

## 2016-12-07 NOTE — Progress Notes (Signed)
SUBJECTIVE: 61 y.o. female with sore throat, myalgias, swollen glands, headache and fever for 3 days. No history of rheumatic fever. Other symptoms: chills.  Current Outpatient Prescriptions on File Prior to Visit  Medication Sig Dispense Refill  . B Complex Vitamins (B COMPLEX PO) Take by mouth. Reported on 11/27/2015    . BIOTIN PO Take 10,000 mcg by mouth daily.    . Cholecalciferol (VITAMIN D3) 5000 units CAPS Take 1 capsule by mouth daily.    . Flaxseed, Linseed, (FLAX SEED OIL) 1000 MG CAPS Take 1 capsule by mouth daily.    Nyoka Cowden Tea, Camillia sinensis, (GREEN TEA EXTRACT PO) Take 400 mg by mouth daily.    Marland Kitchen ibuprofen (ADVIL,MOTRIN) 200 MG tablet Take 600 mg by mouth 2 (two) times daily as needed.     . Multiple Vitamin (MULTIVITAMIN) tablet Take 2 tablets by mouth daily. Reported on 11/27/2015    . zolpidem (AMBIEN) 10 MG tablet Take 1 tablet (10 mg total) by mouth at bedtime as needed. 30 tablet 0   No current facility-administered medications on file prior to visit.     No Known Allergies  Past Medical History:  Diagnosis Date  . Anxiety   . Arthritis   . Breast cancer ([place]) 2003   LT LUMPECTOMY  . Cancer [facility]) 2002   breast cancer left  . GERD (gastroesophageal reflux disease)   . Hay fever   . Hematuria    gross  . Kidney stones   . Personal history of chemotherapy 2003   BREAST CA  . Personal history of radiation therapy 2003   BREAST CA  . S/P chemotherapy, time since greater than 12 weeks    left 2003  . S/P radiation therapy 2003   left breast cancer  . Shortness of breath dyspnea     Past Surgical History:  Procedure Laterality Date  . ANKLE FUSION  2000  . BREAST EXCISIONAL BIOPSY Left 2003   positive  . BREAST LUMPECTOMY  2002   L side  . CYSTOSCOPY WITH STENT PLACEMENT Right 03/23/2016   Procedure: CYSTOSCOPY WITH STENT PLACEMENT;  Surgeon: Hollice Espy, MD;  Location: [facility] ORS;  Service: Urology;  Laterality: Right;  . URETEROSCOPY WITH HOLMIUM  LASER LITHOTRIPSY Right 03/23/2016   Procedure: URETEROSCOPY WITH HOLMIUM LASER LITHOTRIPSY;  Surgeon: Hollice Espy, MD;  Location: [facility] ORS;  Service: Urology;  Laterality: Right;    Family History  Problem Relation Age of Onset  . Cancer Mother   . Breast cancer Mother 67  . Alcohol abuse Father   . Heart disease Father   . Hypertension Father   . Cancer Sister   . Heart disease Sister   . Breast cancer Sister 39  . Heart disease Brother   . Stroke Maternal Grandfather   . Diabetes Maternal Grandfather   . Kidney cancer Neg Hx   . Kidney disease Neg Hx   . Prostate cancer Neg Hx   . Bladder Cancer Neg Hx     Social History   Social History  . Marital status: Divorced    Spouse name: N/A  . Number of children: N/A  . Years of education: N/A   Occupational History  . Not on file.   Social History Main Topics  . Smoking status: Former Smoker    Quit date: 03/12/2013  . Smokeless tobacco: Never Used  . Alcohol use 8.4 oz/week    14 Shots of liquor per week     Comment: occasional  .  Drug use: No  . Sexual activity: No   Other Topics Concern  . Not on file   Social History Narrative  . No narrative on file   The PMH, PSH, Social History, Family History, Medications, and allergies have been reviewed in [facility], and have been updated if relevant.  OBJECTIVE:  BP (!) 152/98 (BP Location: Right Arm, Patient Position: Sitting, Cuff Size: Normal)   Pulse 89   Temp 98.3 F (36.8 C) (Oral)   Wt 155 lb 8 oz (70.5 kg)   SpO2 95%   BMI 28.44 kg/m   Vitals as noted above. Appears alert, well appearing, and in no distress. Ears: bilateral TM's and external ear canals normal Oropharynx: tonsils hypertrophied with exudate Neck: supple, no significant adenopathy Lungs: clear to auscultation, no wheezes, rales or rhonchi, symmetric air entry Rapid Strep test is positive  ASSESSMENT: Streptococcal pharyngitis  PLAN: Per orders. Gargle, use acetaminophen or other OTC  analgesic, and take Rx fully as prescribed. Call if other family members develop similar symptoms. See prn.

## 2016-12-11 ENCOUNTER — Ambulatory Visit: Payer: BLUE CROSS/BLUE SHIELD | Admitting: Urology

## 2017-01-08 ENCOUNTER — Encounter: Payer: Self-pay | Admitting: Urology

## 2017-01-08 ENCOUNTER — Ambulatory Visit
Admission: RE | Admit: 2017-01-08 | Discharge: 2017-01-08 | Disposition: A | Discharge status: Home / Self Care | Payer: BLUE CROSS/BLUE SHIELD | Source: Ambulatory Visit | Attending: Urology | Admitting: Urology

## 2017-01-08 ENCOUNTER — Ambulatory Visit: Payer: BLUE CROSS/BLUE SHIELD | Admitting: Urology

## 2017-01-08 VITALS — BP 157/96 | HR 97 | Ht 62.0 in | Wt 154.0 lb

## 2017-01-08 DIAGNOSIS — N200 Calculus of kidney: Secondary | ICD-10-CM | POA: Insufficient documentation

## 2017-01-08 DIAGNOSIS — Z87442 Personal history of urinary calculi: Secondary | ICD-10-CM

## 2017-01-08 NOTE — Progress Notes (Signed)
01/08/2017 2:00 PM   Sarah Weeks 05-15-1956 [id number]  Referring provider: Lucille Passy, MD [place], [place] [id number]  Chief Complaint  Patient presents with  . Nephrolithiasis    32month w/KUB    HPI: 61 year old female with history of nephrolithiasis who presents today for routine 6 month follow-up.  She underwent right ureteroscopy, laser lithotripsy on 03/23/2016 for a 12 mm right lower pole stone along with other nonobstructing stones. There is questionable filling defect within this kidney but no was appreciated and ureteroscopy. 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  She underwent cystoscopy, stent removal on 04/03/2016. Follow up renal ultrasound shows no hydronephrosis.   Stone analysis shows calcium oxalate dihydrate 20%, calcium oxalate monohydrate 72%, calcium phosphate 2%, calcium hydrogen phosphate 5%.  Since her last visit, she had no further stone episodes, flank pain or gross hematuria.  She has increased her fluid intake as much as possible.  KUB today shows not obvious residual stones or new stone burden.   PMH: Past Medical History:  Diagnosis Date  . Anxiety   . Arthritis   . Breast cancer ([place]) 2003   LT LUMPECTOMY  . Cancer [facility]) 2002   breast cancer left  . GERD (gastroesophageal reflux disease)   . Hay fever   . Hematuria    gross  . Kidney stones   . Personal history of chemotherapy 2003   BREAST CA  . Personal history of radiation therapy 2003   BREAST CA  . S/P chemotherapy, time since greater than 12 weeks    left 2003  . S/P radiation therapy 2003   left breast cancer  . Shortness of breath dyspnea     Surgical History: Past Surgical History:  Procedure Laterality Date  . ANKLE FUSION  2000  . BREAST EXCISIONAL BIOPSY Left 2003   positive  . BREAST LUMPECTOMY  2002   L side  . CYSTOSCOPY WITH STENT PLACEMENT Right 03/23/2016   Procedure: CYSTOSCOPY WITH STENT  PLACEMENT;  Surgeon: Hollice Espy, MD;  Location: [facility] ORS;  Service: Urology;  Laterality: Right;  . URETEROSCOPY WITH HOLMIUM LASER LITHOTRIPSY Right 03/23/2016   Procedure: URETEROSCOPY WITH HOLMIUM LASER LITHOTRIPSY;  Surgeon: Hollice Espy, MD;  Location: [facility] ORS;  Service: Urology;  Laterality: Right;    Home Medications:  Allergies as of 01/08/2017   No Known Allergies     Medication List       Accurate as of 01/08/17 11:59 PM. Always use your most recent med list.          B COMPLEX PO Take by mouth. Reported on 11/27/2015   BIOTIN PO Take 10,000 mcg by mouth daily.   Flax Seed Oil 1000 MG Caps Take 1 capsule by mouth daily.   GREEN TEA EXTRACT PO Take 400 mg by mouth daily.   ibuprofen 200 MG tablet Commonly known as:  ADVIL,MOTRIN Take 600 mg by mouth 2 (two) times daily as needed.   multivitamin tablet Take 2 tablets by mouth daily. Reported on 11/27/2015   Vitamin D3 5000 units Caps Take 1 capsule by mouth daily.   zolpidem 10 MG tablet Commonly known as:  AMBIEN Take 1 tablet (10 mg total) by mouth at bedtime as needed.       Allergies: No Known Allergies  Family History: Family History  Problem Relation Age of Onset  . Cancer Mother   . Breast cancer Mother 61  . Alcohol abuse Father   .  Heart disease Father   . Hypertension Father   . Cancer Sister   . Heart disease Sister   . Breast cancer Sister 43  . Heart disease Brother   . Stroke Maternal Grandfather   . Diabetes Maternal Grandfather   . Kidney cancer Neg Hx   . Kidney disease Neg Hx   . Prostate cancer Neg Hx   . Bladder Cancer Neg Hx     Social History:  reports that she quit smoking about 3 years ago. She has never used smokeless tobacco. She reports that she drinks about 8.4 oz of alcohol per week . She reports that she does not use drugs.  ROS: UROLOGY Frequent Urination?: No Hard to postpone urination?: No Burning/pain with urination?: No Get up at night to  urinate?: No Leakage of urine?: No Urine stream starts and stops?: No Trouble starting stream?: No Do you have to strain to urinate?: No Blood in urine?: No Urinary tract infection?: No Sexually transmitted disease?: No Injury to kidneys or bladder?: No Painful intercourse?: No Weak stream?: No Currently pregnant?: No Vaginal bleeding?: No Last menstrual period?: n  Gastrointestinal Nausea?: No Vomiting?: No Indigestion/heartburn?: No Diarrhea?: Yes Constipation?: No  Constitutional Fever: No Night sweats?: No Weight loss?: No Fatigue?: No  Skin Skin rash/lesions?: No Itching?: No  Eyes Blurred vision?: No Double vision?: No  Ears/Nose/Throat Sore throat?: No Sinus problems?: No  Hematologic/Lymphatic Swollen glands?: No Easy bruising?: No  Cardiovascular Leg swelling?: No Chest pain?: No  Respiratory Cough?: No Shortness of breath?: No  Endocrine Excessive thirst?: No  Musculoskeletal Back pain?: No Joint pain?: No  Neurological Headaches?: No Dizziness?: No  Psychologic Depression?: No Anxiety?: No  Physical Exam: BP (!) 157/96   Pulse 97   Ht 5\' 2"  (1.575 m)   Wt 154 lb (69.9 kg)   BMI 28.17 kg/m   Constitutional:  Alert and oriented, No acute distress. HEENT: [place] AT, moist mucus membranes.  Trachea midline, no masses. Cardiovascular: No clubbing, cyanosis, or edema. Respiratory: Normal respiratory effort, no increased work of breathing. GI: Abdomen is soft, nontender, nondistended, no abdominal masses GU: No CVA tenderness.  Skin: No rashes, bruises or suspicious lesions. Neurologic: Grossly intact, no focal deficits, moving all 4 extremities. Psychiatric: Normal mood and affect.  Laboratory Data: Lab Results  Component Value Date   WBC 5.7 03/12/2016   HGB 13.4 03/12/2016   HCT 38.3 03/12/2016   MCV 98.6 03/12/2016   PLT 242 03/12/2016    Lab Results  Component Value Date   CREATININE 0.79 04/01/2016     Urinalysis    Component Value Date/Time   APPEARANCEUR Cloudy (A) 04/03/2016 1019   GLUCOSEU Negative 04/03/2016 1019   BILIRUBINUR Negative 04/03/2016 1019   PROTEINUR 3+ (A) 04/03/2016 1019   UROBILINOGEN 0.2 11/27/2015 1036   NITRITE Negative 04/03/2016 1019   LEUKOCYTESUR 2+ (A) 04/03/2016 1019    Pertinent Imaging: CLINICAL DATA:  History kidney stones.  Prior lithotripsy.  EXAM: ABDOMEN - 1 VIEW  COMPARISON:  CT 01/17/2016 .  FINDINGS: Soft tissue structures are unremarkable. No bowel distention. Stool noted throughout the colon. Persistent right nephrolith cannot be excluded. 5 mm calcific density noted over the right mid flank. Right upper ureteral stone cannot be excluded. Calcifications noted the pelvis consistent phleboliths. No acute bony abnormality .  IMPRESSION: 1.  Persistent right nephrolithiasis cannot be excluded.  2.  5 mm stone fragment in the right mid ureter cannot be excluded   Electronically Signed  By: [place]   On: 01/08/2017 13:07  KUB personally reviewed today. Patient denies any right flank pain and possible stone fragment in the right mid ureter is likely the tip of the transverse process.  Assessment & Plan:    1. History of kidney stones Status post ureteroscopy 6 months ago No residual stone burden or flank pain No recurrent stone episodes KUB today is unremarkable Sttone diet and signs and symptoms of stones- she will follow up as needed at this point   F/u prn  Hollice Espy, MD  [address] [place],  [phone]

## 2017-01-18 ENCOUNTER — Ambulatory Visit (INDEPENDENT_AMBULATORY_CARE_PROVIDER_SITE_OTHER): Payer: BLUE CROSS/BLUE SHIELD | Admitting: Family Medicine

## 2017-01-18 DIAGNOSIS — R030 Elevated blood-pressure reading, without diagnosis of hypertension: Secondary | ICD-10-CM | POA: Insufficient documentation

## 2017-01-18 DIAGNOSIS — R000 Tachycardia, unspecified: Secondary | ICD-10-CM | POA: Diagnosis not present

## 2017-01-18 MED ORDER — METOPROLOL SUCCINATE ER 25 MG PO TB24: 12.5000 mg | ORAL_TABLET | Freq: Every day | ORAL | 3 refills | Status: DC

## 2017-01-18 MED ORDER — ZOLPIDEM TARTRATE 10 MG PO TABS: 10.0000 mg | ORAL_TABLET | Freq: Every evening | ORAL | 0 refills | Status: DC | PRN

## 2017-01-18 MED ORDER — AZITHROMYCIN 250 MG PO TABS: ORAL_TABLET | ORAL | 0 refills | Status: DC

## 2017-01-18 NOTE — Assessment & Plan Note (Signed)
>  25 minutes spent in face to face time with patient, >50% spent in counselling or coordination of care discussed elevation of blood pressure and palpitations. Anxiety is likely playing a role but her BP has remained consistently elevated with intermittent palpitations. EKG reassuring. With intermittent palpitations, strong family h/o of HTN and CAD, will start on low dose metoprolol, refer to cardiology for evaluation/ holter monitor. Labs today as well. The patient indicates understanding of these issues and agrees with the plan. Orders Placed This Encounter  Procedures  . TSH  . Comprehensive metabolic panel  . Ambulatory referral to Cardiology  . EKG 12-Lead

## 2017-01-18 NOTE — Progress Notes (Signed)
Subjective:   Patient ID: Sarah Weeks, female    DOB: 06/30/1955, 61 y.o.   MRN: [id number]  Sarah Weeks is a pleasant 61 y.o. year old female who presents to clinic today with Blood Pressure Check and Tachycardia  on 01/18/2017  HPI:  BP has been consistently elevated at specialist offices past few months. She has been having headaches daily, also feels like her heart is racing intermittently.  Has been under more stress but this is a new symptom for her.  Sometimes this makes her short of breath. No CP.   Does have very strong FH of HTN and CAD.  BP Readings from Last 3 Encounters:  01/18/17 (!) 160/90  01/08/17 (!) 157/96  12/07/16 (!) 152/98   Pulse Readings from Last 3 Encounters:  01/18/17 (!) 103  01/08/17 97  12/07/16 89    Lab Results  Component Value Date   TSH 0.54 04/01/2016    Current Outpatient Prescriptions on File Prior to Visit  Medication Sig Dispense Refill  . B Complex Vitamins (B COMPLEX PO) Take by mouth. Reported on 11/27/2015    . BIOTIN PO Take 10,000 mcg by mouth daily.    . Cholecalciferol (VITAMIN D3) 5000 units CAPS Take 1 capsule by mouth daily.    . Flaxseed, Linseed, (FLAX SEED OIL) 1000 MG CAPS Take 1 capsule by mouth daily.    Nyoka Cowden Tea, Camillia sinensis, (GREEN TEA EXTRACT PO) Take 400 mg by mouth daily.    Marland Kitchen ibuprofen (ADVIL,MOTRIN) 200 MG tablet Take 600 mg by mouth 2 (two) times daily as needed.     . Multiple Vitamin (MULTIVITAMIN) tablet Take 2 tablets by mouth daily. Reported on 11/27/2015    . zolpidem (AMBIEN) 10 MG tablet Take 1 tablet (10 mg total) by mouth at bedtime as needed. 30 tablet 0   No current facility-administered medications on file prior to visit.     No Known Allergies  Past Medical History:  Diagnosis Date  . Anxiety   . Arthritis   . Breast cancer ([place]) 2003   LT LUMPECTOMY  . Cancer [facility]) 2002   breast cancer left  . GERD (gastroesophageal reflux disease)   . Hay fever   . Hematuria    gross  . Kidney stones   . Personal history of chemotherapy 2003   BREAST CA  . Personal history of radiation therapy 2003   BREAST CA  . S/P chemotherapy, time since greater than 12 weeks    left 2003  . S/P radiation therapy 2003   left breast cancer  . Shortness of breath dyspnea     Past Surgical History:  Procedure Laterality Date  . ANKLE FUSION  2000  . BREAST EXCISIONAL BIOPSY Left 2003   positive  . BREAST LUMPECTOMY  2002   L side  . CYSTOSCOPY WITH STENT PLACEMENT Right 03/23/2016   Procedure: CYSTOSCOPY WITH STENT PLACEMENT;  Surgeon: Hollice Espy, MD;  Location: [facility] ORS;  Service: Urology;  Laterality: Right;  . URETEROSCOPY WITH HOLMIUM LASER LITHOTRIPSY Right 03/23/2016   Procedure: URETEROSCOPY WITH HOLMIUM LASER LITHOTRIPSY;  Surgeon: Hollice Espy, MD;  Location: [facility] ORS;  Service: Urology;  Laterality: Right;    Family History  Problem Relation Age of Onset  . Cancer Mother   . Breast cancer Mother 83  . Alcohol abuse Father   . Heart disease Father   . Hypertension Father   . Cancer Sister   . Heart disease Sister   .  Breast cancer Sister 67  . Heart disease Brother   . Stroke Maternal Grandfather   . Diabetes Maternal Grandfather   . Kidney cancer Neg Hx   . Kidney disease Neg Hx   . Prostate cancer Neg Hx   . Bladder Cancer Neg Hx     Social History   Social History  . Marital status: Divorced    Spouse name: N/A  . Number of children: N/A  . Years of education: N/A   Occupational History  . Not on file.   Social History Main Topics  . Smoking status: Former Smoker    Quit date: 03/12/2013  . Smokeless tobacco: Never Used  . Alcohol use 8.4 oz/week    14 Shots of liquor per week     Comment: occasional  . Drug use: No  . Sexual activity: No   Other Topics Concern  . Not on file   Social History Narrative  . No narrative on file   The PMH, PSH, Social History, Family History, Medications, and allergies have been reviewed  in [facility], and have been updated if relevant.   Review of Systems  Constitutional: Negative.   HENT: Negative.   Eyes: Negative.   Respiratory: Positive for shortness of breath. Negative for cough, choking, chest tightness, wheezing and stridor.   Cardiovascular: Positive for chest pain and palpitations. Negative for leg swelling.  Musculoskeletal: Negative.   Neurological: Positive for headaches. Negative for dizziness, tremors, seizures, facial asymmetry, speech difficulty, weakness, light-headedness and numbness.  All other systems reviewed and are negative.      Objective:    BP (!) 160/90   Pulse (!) 103   Ht 5\' 2"  (1.575 m)   Wt 155 lb (70.3 kg)   SpO2 98%   BMI 28.35 kg/m   BP Readings from Last 3 Encounters:  01/18/17 (!) 160/90  01/08/17 (!) 157/96  12/07/16 (!) 152/98     Physical Exam  Constitutional: She is oriented to person, place, and time. She appears well-developed and well-nourished. No distress.  HENT:  Head: Normocephalic and atraumatic.  Eyes: Conjunctivae are normal.  Cardiovascular: A regularly irregular rhythm present. Tachycardia present.   Pulmonary/Chest: Effort normal and breath sounds normal.  Musculoskeletal: Normal range of motion.  Neurological: She is alert and oriented to person, place, and time. No cranial nerve deficit.  Skin: Skin is warm and dry. She is not diaphoretic.  Psychiatric: She has a normal mood and affect. Her behavior is normal. Judgment and thought content normal.  Nursing note and vitals reviewed.         Assessment & Plan:   Blood pressure elevated without history of HTN  Tachycardia No Follow-up on file.

## 2017-01-18 NOTE — Addendum Note (Signed)
Addended by: Ellamae Sia on: 01/18/2017 02:59 PM   Modules accepted: Orders

## 2017-01-18 NOTE — Patient Instructions (Signed)
Great to see you.  We are starting metoprolol 12.5 mg daily.  Please come see me in 2 weeks again and we are referring you to heart doctor.  We will call you with your lab results from today.

## 2017-01-19 LAB — COMPREHENSIVE METABOLIC PANEL
ALBUMIN: 4.3 g/dL (ref 3.6–5.1)
ALT: 18 U/L (ref 6–29)
AST: 19 U/L (ref 10–35)
Alkaline Phosphatase: 75 U/L (ref 33–130)
BUN: 21 mg/dL (ref 7–25)
CALCIUM: 9.5 mg/dL (ref 8.6–10.4)
CHLORIDE: 102 mmol/L (ref 98–110)
CO2: 23 mmol/L (ref 20–32)
CREATININE: 0.8 mg/dL (ref 0.50–0.99)
Glucose, Bld: 76 mg/dL (ref 65–99)
POTASSIUM: 4 mmol/L (ref 3.5–5.3)
Sodium: 139 mmol/L (ref 135–146)
TOTAL PROTEIN: 6.8 g/dL (ref 6.1–8.1)
Total Bilirubin: 0.4 mg/dL (ref 0.2–1.2)

## 2017-01-19 LAB — TSH: TSH: 0.63 m[IU]/L

## 2017-01-26 ENCOUNTER — Other Ambulatory Visit
Admission: RE | Admit: 2017-01-26 | Discharge: 2017-01-26 | Disposition: A | Discharge status: Home / Self Care | Payer: BLUE CROSS/BLUE SHIELD | Source: Ambulatory Visit | Attending: Gastroenterology | Admitting: Gastroenterology

## 2017-01-26 DIAGNOSIS — R197 Diarrhea, unspecified: Secondary | ICD-10-CM | POA: Insufficient documentation

## 2017-01-26 LAB — GASTROINTESTINAL PANEL BY PCR, STOOL (REPLACES STOOL CULTURE)
ASTROVIRUS: NOT DETECTED
Adenovirus F40/41: NOT DETECTED
CAMPYLOBACTER SPECIES: NOT DETECTED
CRYPTOSPORIDIUM: NOT DETECTED
Cyclospora cayetanensis: NOT DETECTED
ENTEROAGGREGATIVE E COLI (EAEC): NOT DETECTED
ENTEROPATHOGENIC E COLI (EPEC): NOT DETECTED
ENTEROTOXIGENIC E COLI (ETEC): NOT DETECTED
Entamoeba histolytica: NOT DETECTED
GIARDIA LAMBLIA: NOT DETECTED
NOROVIRUS GI/GII: NOT DETECTED
PLESIMONAS SHIGELLOIDES: NOT DETECTED
ROTAVIRUS A: NOT DETECTED
Salmonella species: NOT DETECTED
Sapovirus (I, II, IV, and V): NOT DETECTED
Shiga like toxin producing E coli (STEC): NOT DETECTED
Shigella/Enteroinvasive E coli (EIEC): NOT DETECTED
Vibrio cholerae: NOT DETECTED
Vibrio species: NOT DETECTED
Yersinia enterocolitica: NOT DETECTED

## 2017-01-26 LAB — C DIFFICILE QUICK SCREEN W PCR REFLEX
C DIFFICILE (CDIFF) INTERP: NOT DETECTED
C DIFFICILE (CDIFF) TOXIN: NEGATIVE
C Diff antigen: NEGATIVE

## 2017-02-01 ENCOUNTER — Ambulatory Visit (INDEPENDENT_AMBULATORY_CARE_PROVIDER_SITE_OTHER): Payer: BLUE CROSS/BLUE SHIELD | Admitting: Family Medicine

## 2017-02-01 ENCOUNTER — Encounter: Payer: Self-pay | Admitting: Family Medicine

## 2017-02-01 DIAGNOSIS — I10 Essential (primary) hypertension: Secondary | ICD-10-CM

## 2017-02-01 HISTORY — DX: Essential (primary) hypertension: I10

## 2017-02-01 NOTE — Assessment & Plan Note (Signed)
Well controlled on low dose metoprolol. Continue current rx. No changes made today.

## 2017-02-01 NOTE — Progress Notes (Signed)
Subjective:   Patient ID: Sarah Weeks, female    DOB: 1955/09/02, 61 y.o.   MRN: [id number]  Sarah Weeks  is a pleasant 61 y.o. year old female who presents to clinic today with Hypertension  on 02/01/2017  HPI:  HTN- started metoprolol 12.5 mg daily two weeks ago due to elevated blood pressure.  BP is much better controlled and she feels less anxious.  No palpitations.  No CP or SOB.  BP Readings from Last 3 Encounters:  02/01/17 124/84  01/18/17 (!) 160/90  01/08/17 (!) 157/96     Current Outpatient Prescriptions on File Prior to Visit  Medication Sig Dispense Refill  . azithromycin (ZITHROMAX) 250 MG tablet 2 tabs by mouth on day 1 followed by 1 tab by mouth daily days 2- 5 6 tablet 0  . B Complex Vitamins (B COMPLEX PO) Take by mouth. Reported on 11/27/2015    . BIOTIN PO Take 10,000 mcg by mouth daily.    . Cholecalciferol (VITAMIN D3) 5000 units CAPS Take 1 capsule by mouth daily.    . Flaxseed, Linseed, (FLAX SEED OIL) 1000 MG CAPS Take 1 capsule by mouth daily.    Nyoka Cowden Tea, Camillia sinensis, (GREEN TEA EXTRACT PO) Take 400 mg by mouth daily.    Marland Kitchen ibuprofen (ADVIL,MOTRIN) 200 MG tablet Take 600 mg by mouth 2 (two) times daily as needed.     . metoprolol succinate (TOPROL-XL) 25 MG 24 hr tablet Take 0.5 tablets (12.5 mg total) by mouth daily. 30 tablet 3  . Multiple Vitamin (MULTIVITAMIN) tablet Take 2 tablets by mouth daily. Reported on 11/27/2015    . zolpidem (AMBIEN) 10 MG tablet Take 1 tablet (10 mg total) by mouth at bedtime as needed. 30 tablet 0   No current facility-administered medications on file prior to visit.     No Known Allergies  Past Medical History:  Diagnosis Date  . Anxiety   . Arthritis   . Breast cancer ([place]) 2003   LT LUMPECTOMY  . Cancer [facility]) 2002   breast cancer left  . GERD (gastroesophageal reflux disease)   . Hay fever   . Hematuria    gross  . HTN (hypertension) 02/01/2017  . Kidney stones   . Personal history of  chemotherapy 2003   BREAST CA  . Personal history of radiation therapy 2003   BREAST CA  . S/P chemotherapy, time since greater than 12 weeks    left 2003  . S/P radiation therapy 2003   left breast cancer  . Shortness of breath dyspnea     Past Surgical History:  Procedure Laterality Date  . ANKLE FUSION  2000  . BREAST EXCISIONAL BIOPSY Left 2003   positive  . BREAST LUMPECTOMY  2002   L side  . CYSTOSCOPY WITH STENT PLACEMENT Right 03/23/2016   Procedure: CYSTOSCOPY WITH STENT PLACEMENT;  Surgeon: Hollice Espy, MD;  Location: [facility] ORS;  Service: Urology;  Laterality: Right;  . URETEROSCOPY WITH HOLMIUM LASER LITHOTRIPSY Right 03/23/2016   Procedure: URETEROSCOPY WITH HOLMIUM LASER LITHOTRIPSY;  Surgeon: Hollice Espy, MD;  Location: [facility] ORS;  Service: Urology;  Laterality: Right;    Family History  Problem Relation Age of Onset  . Cancer Mother   . Breast cancer Mother 38  . Alcohol abuse Father   . Heart disease Father   . Hypertension Father   . Cancer Sister   . Heart disease Sister   . Breast cancer Sister 2  . Heart disease  Brother   . Stroke Maternal Grandfather   . Diabetes Maternal Grandfather   . Kidney cancer Neg Hx   . Kidney disease Neg Hx   . Prostate cancer Neg Hx   . Bladder Cancer Neg Hx     Social History   Social History  . Marital status: Divorced    Spouse name: N/A  . Number of children: N/A  . Years of education: N/A   Occupational History  . Not on file.   Social History Main Topics  . Smoking status: Former Smoker    Quit date: 03/12/2013  . Smokeless tobacco: Never Used  . Alcohol use 8.4 oz/week    14 Shots of liquor per week     Comment: occasional  . Drug use: No  . Sexual activity: No   Other Topics Concern  . Not on file   Social History Narrative  . No narrative on file   The PMH, PSH, Social History, Family History, Medications, and allergies have been reviewed in [facility], and have been updated if  relevant.   Review of Systems  Constitutional: Negative.   Respiratory: Negative.   Cardiovascular: Negative.   Hematological: Negative.   Psychiatric/Behavioral: Negative.   All other systems reviewed and are negative.      Objective:    BP 124/84   Pulse 95   Temp 98.2 F (36.8 C) (Oral)   Resp 16   Ht 5\' 2"  (1.575 m)   Wt 155 lb 1.9 oz (70.4 kg)   SpO2 98%   BMI 28.37 kg/m    Physical Exam  Constitutional: She is oriented to person, place, and time. She appears well-developed and well-nourished. No distress.  HENT:  Head: Normocephalic and atraumatic.  Eyes: Conjunctivae are normal.  Cardiovascular: Normal rate and regular rhythm.   Pulmonary/Chest: Effort normal and breath sounds normal.  Musculoskeletal: Normal range of motion. She exhibits no edema.  Neurological: She is alert and oriented to person, place, and time. No cranial nerve deficit.  Skin: Skin is warm and dry. She is not diaphoretic.  Psychiatric: She has a normal mood and affect. Her behavior is normal. Judgment and thought content normal.  Nursing note and vitals reviewed.         Assessment & Plan:   Essential hypertension No Follow-up on file.

## 2017-02-11 ENCOUNTER — Other Ambulatory Visit: Payer: Self-pay

## 2017-02-11 MED ORDER — ZOLPIDEM TARTRATE 10 MG PO TABS: 10.0000 mg | ORAL_TABLET | Freq: Every evening | ORAL | 0 refills | Status: DC | PRN

## 2017-02-11 NOTE — Telephone Encounter (Signed)
Pt left v/m requesting refill ambien to [facility] [address] Last refilled # 30 on 01/18/17. Last seen 02/01/17; pt still has med but called due to holiday.

## 2017-02-11 NOTE — Telephone Encounter (Signed)
Called in to [place], [place]: [phone]

## 2017-03-11 ENCOUNTER — Ambulatory Visit: Payer: BLUE CROSS/BLUE SHIELD | Admitting: Cardiovascular Disease

## 2017-03-15 ENCOUNTER — Other Ambulatory Visit: Payer: Self-pay

## 2017-03-15 MED ORDER — ZOLPIDEM TARTRATE 10 MG PO TABS: 10.0000 mg | ORAL_TABLET | Freq: Every evening | ORAL | 0 refills | Status: DC | PRN

## 2017-03-15 NOTE — Telephone Encounter (Signed)
Rx called to pharmacy as instructed. 

## 2017-03-15 NOTE — Telephone Encounter (Signed)
CALLED IN TO [id number] - Sarah Weeks, [place]: [phone]

## 2017-03-15 NOTE — Telephone Encounter (Signed)
Pt left v/m requesting refill ambien to [facility] [address] Last refilled # 30 on 02/11/17.last seen 02/01/17.Please advise.

## 2017-04-15 ENCOUNTER — Ambulatory Visit: Payer: BLUE CROSS/BLUE SHIELD

## 2017-04-20 ENCOUNTER — Encounter: Payer: Self-pay | Admitting: Family Medicine

## 2017-04-20 ENCOUNTER — Ambulatory Visit: Payer: BLUE CROSS/BLUE SHIELD | Admitting: Family Medicine

## 2017-04-20 VITALS — BP 144/104 | HR 101 | Temp 97.9°F | Ht 62.0 in | Wt 157.8 lb

## 2017-04-20 DIAGNOSIS — F32A Depression, unspecified: Secondary | ICD-10-CM

## 2017-04-20 DIAGNOSIS — Z23 Encounter for immunization: Secondary | ICD-10-CM

## 2017-04-20 DIAGNOSIS — F419 Anxiety disorder, unspecified: Secondary | ICD-10-CM

## 2017-04-20 DIAGNOSIS — F329 Major depressive disorder, single episode, unspecified: Secondary | ICD-10-CM

## 2017-04-20 MED ORDER — ZOLPIDEM TARTRATE 10 MG PO TABS: 10.0000 mg | ORAL_TABLET | Freq: Every evening | ORAL | 0 refills | Status: DC | PRN

## 2017-04-20 MED ORDER — SERTRALINE HCL 50 MG PO TABS: 50.0000 mg | ORAL_TABLET | Freq: Every day | ORAL | 3 refills | Status: DC

## 2017-04-20 NOTE — Assessment & Plan Note (Signed)
Deteriorated. >25 minutes spent in face to face time with patient, >50% spent in counselling or coordination of care Start Zoloft 50 mg. Patient is to take 1/2 tablet daily for 10 days, then advance to 1 full tablet thereafter. We discussed possible side effects of headache, GI upset, drowsiness, and SI/HI. If thoughts of SI/HI develop, we discussed to present to the emergency immediately. Patient verbalized understanding.

## 2017-04-20 NOTE — Progress Notes (Signed)
Subjective:   Patient ID: Sarah Weeks, female    DOB: 28-Oct-1955, 61 y.o.   MRN: [id number]  Sarah Weeks is a pleasant 61 y.o. year old female who presents to clinic today with Depression (Patient is here today to discuss depression.  Screening completed. She states that she takes Imodium-AD daily due to the diarrhea that is present from the stress.)  on 04/20/2017  HPI:  Anxiety and depression- ongoing for years.  She has tried therapy but felt this doesn't help.  She has been resistant to try an rx and finally thinks she needs to consider this.  Has never taken an SSRI.  Lately, more anxious and tearful.  Does not feel like her self.  Not excited about doing things.  Not sleeping well despite taking Ambien.  No SI or HI.  PHQ9 of 15 today.  Current Outpatient Medications on File Prior to Visit  Medication Sig Dispense Refill  . B Complex Vitamins (B COMPLEX PO) Take by mouth. Reported on 11/27/2015    . BIOTIN PO Take 10,000 mcg by mouth daily.    . Cholecalciferol (VITAMIN D3) 5000 units CAPS Take 1 capsule by mouth daily.    . diphenhydrAMINE (BENADRYL) 25 mg capsule Take 25 mg every 6 (six) hours as needed by mouth.    . Flaxseed, Linseed, (FLAX SEED OIL) 1000 MG CAPS Take 1 capsule by mouth daily.    Nyoka Cowden Tea, Camillia sinensis, (GREEN TEA EXTRACT PO) Take 400 mg by mouth daily.    Marland Kitchen ibuprofen (ADVIL,MOTRIN) 200 MG tablet Take 600 mg by mouth 2 (two) times daily as needed.     . loperamide (IMODIUM A-D) 2 MG tablet Take 2 mg 4 (four) times daily as needed by mouth for diarrhea or loose stools.    . metoprolol succinate (TOPROL-XL) 25 MG 24 hr tablet Take 0.5 tablets (12.5 mg total) by mouth daily. 30 tablet 3  . Multiple Vitamin (MULTIVITAMIN) tablet Take 2 tablets by mouth daily. Reported on 11/27/2015     No current facility-administered medications on file prior to visit.     No Known Allergies  Past Medical History:  Diagnosis Date  . Anxiety   . Arthritis    . Breast cancer ([place]) 2003   LT LUMPECTOMY  . Cancer [facility]) 2002   breast cancer left  . GERD (gastroesophageal reflux disease)   . Hay fever   . Hematuria    gross  . HTN (hypertension) 02/01/2017  . Kidney stones   . Personal history of chemotherapy 2003   BREAST CA  . Personal history of radiation therapy 2003   BREAST CA  . S/P chemotherapy, time since greater than 12 weeks    left 2003  . S/P radiation therapy 2003   left breast cancer  . Shortness of breath dyspnea     Past Surgical History:  Procedure Laterality Date  . ANKLE FUSION  2000  . BREAST EXCISIONAL BIOPSY Left 2003   positive  . BREAST LUMPECTOMY  2002   L side    Family History  Problem Relation Age of Onset  . Cancer Mother   . Breast cancer Mother 59  . Alcohol abuse Father   . Heart disease Father   . Hypertension Father   . Cancer Sister   . Heart disease Sister   . Breast cancer Sister 42  . Heart disease Brother   . Stroke Maternal Grandfather   . Diabetes Maternal Grandfather   . Kidney  cancer Neg Hx   . Kidney disease Neg Hx   . Prostate cancer Neg Hx   . Bladder Cancer Neg Hx     Social History   Socioeconomic History  . Marital status: Divorced    Spouse name: Not on file  . Number of children: Not on file  . Years of education: Not on file  . Highest education level: Not on file  Social Needs  . Financial resource strain: Not on file  . Food insecurity - worry: Not on file  . Food insecurity - inability: Not on file  . Transportation needs - medical: Not on file  . Transportation needs - non-medical: Not on file  Occupational History  . Not on file  Tobacco Use  . Smoking status: Former Smoker    Last attempt to quit: 03/12/2013    Years since quitting: 4.1  . Smokeless tobacco: Never Used  Substance and Sexual Activity  . Alcohol use: Yes    Alcohol/week: 8.4 oz    Types: 14 Shots of liquor per week    Comment: occasional  . Drug use: No  . Sexual activity: No    Other Topics Concern  . Not on file  Social History Narrative  . Not on file   The PMH, PSH, Social History, Family History, Medications, and allergies have been reviewed in [facility], and have been updated if relevant.  Review of Systems  Psychiatric/Behavioral: Positive for decreased concentration, dysphoric mood and sleep disturbance. Negative for agitation, behavioral problems, confusion, hallucinations, self-injury and suicidal ideas. The patient is nervous/anxious. The patient is not hyperactive.   All other systems reviewed and are negative.      Objective:    BP (!) 144/104 (BP Location: Left Arm, Patient Position: Sitting, Cuff Size: Normal)   Pulse (!) 101   Temp 97.9 F (36.6 C) (Oral)   Ht 5\' 2"  (1.575 m)   Wt 157 lb 12.8 oz (71.6 kg)   SpO2 95%   BMI 28.86 kg/m    Physical Exam  Constitutional: She is oriented to person, place, and time. She appears well-developed and well-nourished. No distress.  HENT:  Head: Normocephalic and atraumatic.  Eyes: Conjunctivae are normal.  Cardiovascular: Normal rate.  Pulmonary/Chest: Effort normal.  Neurological: She is alert and oriented to person, place, and time. No cranial nerve deficit.  Skin: Skin is warm and dry. She is not diaphoretic.  Psychiatric: Her behavior is normal. Judgment and thought content normal.  tearful  Nursing note and vitals reviewed.         Assessment & Plan:   Need for influenza vaccination - Plan: Flu Vaccine QUAD 6+ mos PF IM (Fluarix Quad PF) No Follow-up on file.

## 2017-04-20 NOTE — Patient Instructions (Signed)
Great to see you.  We are zoloft-  Start Zoloft 50 mg. Take 1/2 tablet daily for 10 days, then advance to 1 full tablet thereafter. Possible side effects could include  headache, GI upset, drowsiness, and SI/HI.

## 2017-04-21 ENCOUNTER — Telehealth: Payer: Self-pay | Admitting: Family Medicine

## 2017-04-21 ENCOUNTER — Other Ambulatory Visit: Payer: Self-pay

## 2017-04-21 MED ORDER — ZOLPIDEM TARTRATE 10 MG PO TABS: 10.0000 mg | ORAL_TABLET | Freq: Every evening | ORAL | 5 refills | Status: DC | PRN

## 2017-04-21 NOTE — Telephone Encounter (Signed)
Chart indicates Rx was to be called in at pts 11/6 OV

## 2017-04-21 NOTE — Telephone Encounter (Signed)
Controlled substance 

## 2017-04-21 NOTE — Progress Notes (Signed)
Printed Zolpidem for TA to sign in am/looks like was set as phone in without refills/will address/thx dmf

## 2017-04-21 NOTE — Telephone Encounter (Signed)
Copied from [place]. Topic: Quick Communication - See Telephone Encounter >> Apr 21, 2017  9:49 AM Arletha Grippe wrote: CRM for notification. See Telephone encounter for: pt would like to have refill of ambien. She would like some refills.   [facility] [place] She states that this was discussed at last appt yesterday, but not called in.    04/21/17.

## 2017-05-05 ENCOUNTER — Other Ambulatory Visit: Payer: Self-pay | Admitting: Family Medicine

## 2017-05-05 DIAGNOSIS — Z1231 Encounter for screening mammogram for malignant neoplasm of breast: Secondary | ICD-10-CM

## 2017-05-27 ENCOUNTER — Ambulatory Visit
Admission: RE | Admit: 2017-05-27 | Discharge: 2017-05-27 | Disposition: A | Discharge status: Home / Self Care | Payer: BLUE CROSS/BLUE SHIELD | Source: Ambulatory Visit | Attending: Family Medicine | Admitting: Family Medicine

## 2017-05-27 DIAGNOSIS — Z1231 Encounter for screening mammogram for malignant neoplasm of breast: Secondary | ICD-10-CM | POA: Diagnosis present

## 2017-06-16 ENCOUNTER — Ambulatory Visit: Payer: BLUE CROSS/BLUE SHIELD | Admitting: Family Medicine

## 2017-06-16 ENCOUNTER — Encounter: Payer: Self-pay | Admitting: Family Medicine

## 2017-06-16 VITALS — BP 150/96 | HR 95 | Temp 98.3°F | Ht 62.0 in | Wt 152.8 lb

## 2017-06-16 DIAGNOSIS — F419 Anxiety disorder, unspecified: Secondary | ICD-10-CM | POA: Diagnosis not present

## 2017-06-16 DIAGNOSIS — F32A Depression, unspecified: Secondary | ICD-10-CM

## 2017-06-16 DIAGNOSIS — F329 Major depressive disorder, single episode, unspecified: Secondary | ICD-10-CM | POA: Diagnosis not present

## 2017-06-16 MED ORDER — ZOLPIDEM TARTRATE 10 MG PO TABS: 10.0000 mg | ORAL_TABLET | Freq: Every evening | ORAL | 5 refills | Status: DC | PRN

## 2017-06-16 MED ORDER — SERTRALINE HCL 50 MG PO TABS: 75.0000 mg | ORAL_TABLET | Freq: Every day | ORAL | 3 refills | Status: DC

## 2017-06-16 MED ORDER — METOPROLOL SUCCINATE ER 25 MG PO TB24: 12.5000 mg | ORAL_TABLET | Freq: Every day | ORAL | 3 refills | Status: DC

## 2017-06-16 NOTE — Patient Instructions (Signed)
Great to see you.  Happy New Year!   We are increasing your Zoloft to 75 mg daily.  Please keep me updated.

## 2017-06-16 NOTE — Assessment & Plan Note (Signed)
>  25 minutes spent in face to face time with patient, >50% spent in counselling or coordination of care Depression resolved and anxiety improving but still symptomatic. Discussed tx options.  We agreed to try increasing her zoloft to 75 mg daily. She will update me in a few weeks. The patient indicates understanding of these issues and agrees with the plan.

## 2017-06-16 NOTE — Progress Notes (Signed)
Subjective:   Patient ID: Sarah Weeks, female    DOB: 12/13/1955, 62 y.o.   MRN: [id number]  Sarah Weeks is a pleasant 62 y.o. year old female who presents to clinic today with Anxiety (Patient is here today to F/U with anxiety.  She takes Sertraline 50mg .  She still has a lot of anxiety attacks and sometimes it is so bad that she almost went straight to the [facility].  Feels that it is better than it was prior to starting the Sertraline but needs an adjustment.)  on 06/16/2017  HPI:  Anxiety/depression-   Initially saw her for this on 04/20/17.  Note reviewed. At that time, PHQ9 was 15.  Started Zoloft 50 mg daily.  She does feel her anxiety is better but still having quite a few panic attacks. Depression has resolved.  Current Outpatient Medications on File Prior to Visit  Medication Sig Dispense Refill  . B Complex Vitamins (B COMPLEX PO) Take by mouth. Reported on 11/27/2015    . BIOTIN PO Take 10,000 mcg by mouth daily.    . Cholecalciferol (VITAMIN D3) 5000 units CAPS Take 1 capsule by mouth daily.    . diphenhydrAMINE (BENADRYL) 25 mg capsule Take 25 mg every 6 (six) hours as needed by mouth.    Marland Kitchen ibuprofen (ADVIL,MOTRIN) 200 MG tablet Take 600 mg by mouth 2 (two) times daily as needed.     . loperamide (IMODIUM A-D) 2 MG tablet Take 2 mg 4 (four) times daily as needed by mouth for diarrhea or loose stools.    . metoprolol succinate (TOPROL-XL) 25 MG 24 hr tablet Take 0.5 tablets (12.5 mg total) by mouth daily. 30 tablet 3  . Multiple Vitamin (MULTIVITAMIN) tablet Take 2 tablets by mouth daily. Reported on 11/27/2015    . sertraline (ZOLOFT) 50 MG tablet Take 1 tablet (50 mg total) daily by mouth. 30 tablet 3  . zolpidem (AMBIEN) 10 MG tablet Take 1 tablet (10 mg total) at bedtime as needed by mouth. 30 tablet 5   No current facility-administered medications on file prior to visit.     No Known Allergies  Past Medical History:  Diagnosis Date  . Anxiety   .  Arthritis   . Breast cancer ([place]) 2003   LT LUMPECTOMY  . Cancer [facility]) 2002   breast cancer left  . GERD (gastroesophageal reflux disease)   . Hay fever   . Hematuria    gross  . HTN (hypertension) 02/01/2017  . Kidney stones   . Personal history of chemotherapy 2003   BREAST CA  . Personal history of radiation therapy 2003   BREAST CA  . S/P chemotherapy, time since greater than 12 weeks    left 2003  . S/P radiation therapy 2003   left breast cancer  . Shortness of breath dyspnea     Past Surgical History:  Procedure Laterality Date  . ANKLE FUSION  2000  . BREAST EXCISIONAL BIOPSY Left 2003   positive  . BREAST LUMPECTOMY  2002   L side  . CYSTOSCOPY WITH STENT PLACEMENT Right 03/23/2016   Procedure: CYSTOSCOPY WITH STENT PLACEMENT;  Surgeon: Sarah Espy, MD;  Location: [facility] ORS;  Service: Urology;  Laterality: Right;  . URETEROSCOPY WITH HOLMIUM LASER LITHOTRIPSY Right 03/23/2016   Procedure: URETEROSCOPY WITH HOLMIUM LASER LITHOTRIPSY;  Surgeon: Sarah Espy, MD;  Location: [facility] ORS;  Service: Urology;  Laterality: Right;    Family History  Problem Relation Age of Onset  .  Cancer Mother   . Breast cancer Mother 16  . Alcohol abuse Father   . Heart disease Father   . Hypertension Father   . Cancer Sister   . Heart disease Sister   . Breast cancer Sister 31  . Heart disease Brother   . Stroke Maternal Grandfather   . Diabetes Maternal Grandfather   . Kidney cancer Neg Hx   . Kidney disease Neg Hx   . Prostate cancer Neg Hx   . Bladder Cancer Neg Hx     Social History   Socioeconomic History  . Marital status: Divorced    Spouse name: Not on file  . Number of children: Not on file  . Years of education: Not on file  . Highest education level: Not on file  Social Needs  . Financial resource strain: Not on file  . Food insecurity - worry: Not on file  . Food insecurity - inability: Not on file  . Transportation needs - medical: Not on file  .  Transportation needs - non-medical: Not on file  Occupational History  . Not on file  Tobacco Use  . Smoking status: Former Smoker    Last attempt to quit: 03/12/2013    Years since quitting: 4.2  . Smokeless tobacco: Never Used  Substance and Sexual Activity  . Alcohol use: Yes    Alcohol/week: 8.4 oz    Types: 14 Shots of liquor per week    Comment: occasional  . Drug use: No  . Sexual activity: No  Other Topics Concern  . Not on file  Social History Narrative  . Not on file   The PMH, PSH, Social History, Family History, Medications, and allergies have been reviewed in [facility], and have been updated if relevant.   Review of Systems  Psychiatric/Behavioral: Negative for agitation, behavioral problems, confusion, decreased concentration, dysphoric mood, hallucinations, self-injury, sleep disturbance and suicidal ideas. The patient is nervous/anxious. The patient is not hyperactive.   All other systems reviewed and are negative.      Objective:    BP (!) 150/96 (BP Location: Left Arm, Patient Position: Sitting, Cuff Size: Normal)   Pulse 95   Temp 98.3 F (36.8 C) (Oral)   Ht 5\' 2"  (1.575 m)   Wt 152 lb 12.8 oz (69.3 kg)   SpO2 99%   BMI 27.95 kg/m    Physical Exam   General:  Well-developed,well-nourished,in no acute distress; alert,appropriate and cooperative throughout examination Head:  normocephalic and atraumatic.   Eyes:  vision grossly intact, PERRL Ears:  R ear normal and L ear normal externally, TMs clear bilaterally Nose:  no external deformity.   Mouth:  good dentition.   Neck:  No deformities, masses, or tenderness noted. Lungs:  Normal respiratory effort, chest expands symmetrically. Lungs are clear to auscultation, no crackles or wheezes. Heart:  Normal rate and regular rhythm. S1 and S2 normal without gallop, murmur, click, rub or other extra sounds. Msk:  No deformity or scoliosis noted of thoracic or lumbar spine.   Extremities:  No clubbing,  cyanosis, edema, or deformity noted with normal full range of motion of all joints.   Neurologic:  alert & oriented X3 and gait normal.   Skin:  Intact without suspicious lesions or rashes Psych:  Cognition and judgment appear intact. Alert and cooperative with normal attention span and concentration. No apparent delusions, illusions, hallucinations        Assessment & Plan:   Anxiety and depression No Follow-up on file.

## 2017-06-18 ENCOUNTER — Telehealth: Payer: Self-pay | Admitting: Family Medicine

## 2017-06-18 NOTE — Telephone Encounter (Signed)
Copied from [place] [phone]. Topic: Quick Communication - See Telephone Encounter >> Jun 18, 2017  9:06 AM Cleaster Corin, NT wrote: CRM for notification. See Telephone encounter for:   06/18/17. Pt.calling to speaking with someone on how her bill was coded pt. Has already talked with billing but wants to speak with some one in the office. 11-6 office visit pt. Can be reached at [phone]

## 2017-06-21 NOTE — Telephone Encounter (Signed)
Hi Rose, Did I miscode this one?

## 2017-06-21 NOTE — Telephone Encounter (Signed)
Hi Dr. Deborra Medina,  It appears the charges for the [facility] billed out for 3x what it should have.  $780 billed for it, but it should have only been $260.  I have requested this be reviewed, corrected, and re-filed.  In the meantime, I've requested the balance be taken out of patient responsibility until further research is conducted.  I tried to contact patient, but was unable to reach her and all I have is my personal # since I am working from home and I don't give that to patients.  If you want, you can have someone from your office to contact her and pass the info along to her.  Hope this helps!! :)  Thanks! Kalman Shan

## 2017-07-12 ENCOUNTER — Ambulatory Visit: Admit: 2017-07-12 | Payer: BLUE CROSS/BLUE SHIELD | Admitting: Unknown Physician Specialty

## 2017-07-12 SURGERY — COLONOSCOPY WITH PROPOFOL
Anesthesia: General

## 2017-07-27 ENCOUNTER — Encounter: Payer: Self-pay | Admitting: Family Medicine

## 2017-07-27 ENCOUNTER — Ambulatory Visit: Payer: BLUE CROSS/BLUE SHIELD | Admitting: Family Medicine

## 2017-07-27 VITALS — BP 150/96 | HR 86 | Temp 98.7°F | Ht 62.0 in | Wt 151.6 lb

## 2017-07-27 DIAGNOSIS — R109 Unspecified abdominal pain: Secondary | ICD-10-CM | POA: Insufficient documentation

## 2017-07-27 DIAGNOSIS — R002 Palpitations: Secondary | ICD-10-CM | POA: Diagnosis not present

## 2017-07-27 DIAGNOSIS — R000 Tachycardia, unspecified: Secondary | ICD-10-CM

## 2017-07-27 DIAGNOSIS — R1013 Epigastric pain: Secondary | ICD-10-CM | POA: Diagnosis not present

## 2017-07-27 LAB — COMPREHENSIVE METABOLIC PANEL
ALT: 20 U/L (ref 0–35)
AST: 18 U/L (ref 0–37)
Albumin: 4.6 g/dL (ref 3.5–5.2)
Alkaline Phosphatase: 70 U/L (ref 39–117)
BUN: 19 mg/dL (ref 6–23)
CO2: 30 meq/L (ref 19–32)
Calcium: 10.1 mg/dL (ref 8.4–10.5)
Chloride: 102 mEq/L (ref 96–112)
Creatinine, Ser: 0.71 mg/dL (ref 0.40–1.20)
GFR: 88.91 mL/min (ref >60.00)
GLUCOSE: 100 mg/dL — AB (ref 70–99)
Potassium: 4.2 mEq/L (ref 3.5–5.1)
Sodium: 141 mEq/L (ref 135–145)
Total Bilirubin: 0.4 mg/dL (ref 0.2–1.2)
Total Protein: 7.2 g/dL (ref 6.0–8.3)

## 2017-07-27 LAB — CBC
HCT: 41.1 % (ref 36.0–46.0)
HEMOGLOBIN: 13.7 g/dL (ref 12.0–15.0)
MCHC: 33.5 g/dL (ref 30.0–36.0)
MCV: 106.4 fl — ABNORMAL HIGH (ref 78.0–100.0)
Platelets: 304 10*3/uL (ref 150.0–400.0)
RBC: 3.86 Mil/uL — ABNORMAL LOW (ref 3.87–5.11)
RDW: 14 % (ref 11.5–15.5)
WBC: 8.1 10*3/uL (ref 4.0–10.5)

## 2017-07-27 LAB — H. PYLORI ANTIBODY, IGG: H PYLORI IGG: NEGATIVE

## 2017-07-27 MED ORDER — GI COCKTAIL ~~LOC~~
30.0000 mL | Freq: Once | ORAL | Status: AC
  Administered 2017-07-27: 30 mL via ORAL

## 2017-07-27 MED ORDER — SUCRALFATE 1 GM/10ML PO SUSP
1.0000 g | Freq: Three times a day (TID) | ORAL | 0 refills | Status: DC
Start: 2017-07-27 — End: 2017-08-10

## 2017-07-27 MED ORDER — PANTOPRAZOLE SODIUM 40 MG PO TBEC
40.0000 mg | DELAYED_RELEASE_TABLET | Freq: Two times a day (BID) | ORAL | 1 refills | Status: DC
Start: 2017-07-27 — End: 2017-08-17

## 2017-07-27 NOTE — Assessment & Plan Note (Signed)
EKG reassuring- NSR. Does seem consistent with PUD. Will place her on protonix and carafate.  Check CBC and Hpylori today. Advised close follow up. Orders Placed This Encounter  Procedures  . H. pylori antibody, IgG  . CBC  . Comprehensive metabolic panel  . EKG 12-Lead   Orders Placed This Encounter  Procedures  . H. pylori antibody, IgG  . CBC  . Comprehensive metabolic panel  . EKG 12-Lead

## 2017-07-27 NOTE — Progress Notes (Signed)
Subjective:   Patient ID: Sarah Weeks, female    DOB: 1956/01/31, 62 y.o.   MRN: [id number]  Sarah Weeks is a pleasant 62 y.o. year old female who presents to clinic today with Abdominal Pain (Patient is here today C/O umbilical and epigastric abd pain since Wednesday.  She thought it was getting better but it got much worse yesterday and woke this am vomiting.  She states "The heartburn is terrible, and the belching and upset stomach."  She said she felt like her heart was racing Wednesday.  She states that she has 2 small ulcers. She has been Tx with Pepto.  States that she has had black tarry stools due to the [facility] but that is not so much now since she ran out.  She takes Ibuprofen.)  on 07/27/2017  HPI:  Abdominal pain- epigastric/umbilical for past 6 days, intermittent.  Thought it was getting better but much worse yesterday and woke up vomiting today. She has been taking Pepto Bismol without much relief in symptoms.  She did have dark stools while she was taking the pepto bismol.  This is better now that she ran out of it. Also admits to taking Ibuprofen regularly.  Colonoscopy and endoscopy done 10/16/13- reviewed. Endoscopy showed erosive esophagitis, gastric ulcer and motility disorder. Colonoscopy neg- advised 10 year recall.      Current Outpatient Medications on File Prior to Visit  Medication Sig Dispense Refill  . B Complex Vitamins (B COMPLEX PO) Take by mouth. Reported on 11/27/2015    . BIOTIN PO Take 10,000 mcg by mouth daily.    . Cholecalciferol (VITAMIN D3) 5000 units CAPS Take 1 capsule by mouth daily.    . diphenhydrAMINE (BENADRYL) 25 mg capsule Take 25 mg every 6 (six) hours as needed by mouth.    Marland Kitchen ibuprofen (ADVIL,MOTRIN) 200 MG tablet Take 600 mg by mouth 2 (two) times daily as needed.     . loperamide (IMODIUM A-D) 2 MG tablet Take 2 mg 4 (four) times daily as needed by mouth for diarrhea or loose stools.    . metoprolol succinate (TOPROL-XL) 25 MG  24 hr tablet Take 0.5 tablets (12.5 mg total) by mouth daily. 30 tablet 3  . Multiple Vitamin (MULTIVITAMIN) tablet Take 2 tablets by mouth daily. Reported on 11/27/2015    . sertraline (ZOLOFT) 50 MG tablet Take 1.5 tablets (75 mg total) by mouth daily. (Patient taking differently: Take 50 mg by mouth daily. ) 45 tablet 3  . zolpidem (AMBIEN) 10 MG tablet Take 1 tablet (10 mg total) by mouth at bedtime as needed. 30 tablet 5   No current facility-administered medications on file prior to visit.     No Known Allergies  Past Medical History:  Diagnosis Date  . Anxiety   . Arthritis   . Breast cancer ([place]) 2003   LT LUMPECTOMY  . Cancer [facility]) 2002   breast cancer left  . GERD (gastroesophageal reflux disease)   . Hay fever   . Hematuria    gross  . HTN (hypertension) 02/01/2017  . Kidney stones   . Personal history of chemotherapy 2003   BREAST CA  . Personal history of radiation therapy 2003   BREAST CA  . S/P chemotherapy, time since greater than 12 weeks    left 2003  . S/P radiation therapy 2003   left breast cancer  . Shortness of breath dyspnea     Past Surgical History:  Procedure Laterality Date  .  ANKLE FUSION  2000  . BREAST EXCISIONAL BIOPSY Left 2003   positive  . BREAST LUMPECTOMY  2002   L side  . CYSTOSCOPY WITH STENT PLACEMENT Right 03/23/2016   Procedure: CYSTOSCOPY WITH STENT PLACEMENT;  Surgeon: Hollice Espy, MD;  Location: [facility] ORS;  Service: Urology;  Laterality: Right;  . URETEROSCOPY WITH HOLMIUM LASER LITHOTRIPSY Right 03/23/2016   Procedure: URETEROSCOPY WITH HOLMIUM LASER LITHOTRIPSY;  Surgeon: Hollice Espy, MD;  Location: [facility] ORS;  Service: Urology;  Laterality: Right;    Family History  Problem Relation Age of Onset  . Cancer Mother   . Breast cancer Mother 43  . Alcohol abuse Father   . Heart disease Father   . Hypertension Father   . Cancer Sister   . Heart disease Sister   . Breast cancer Sister 67  . Heart disease Brother   .  Stroke Maternal Grandfather   . Diabetes Maternal Grandfather   . Kidney cancer Neg Hx   . Kidney disease Neg Hx   . Prostate cancer Neg Hx   . Bladder Cancer Neg Hx     Social History   Socioeconomic History  . Marital status: Divorced    Spouse name: Not on file  . Number of children: Not on file  . Years of education: Not on file  . Highest education level: Not on file  Social Needs  . Financial resource strain: Not on file  . Food insecurity - worry: Not on file  . Food insecurity - inability: Not on file  . Transportation needs - medical: Not on file  . Transportation needs - non-medical: Not on file  Occupational History  . Not on file  Tobacco Use  . Smoking status: Former Smoker    Last attempt to quit: 03/12/2013    Years since quitting: 4.3  . Smokeless tobacco: Never Used  Substance and Sexual Activity  . Alcohol use: Yes    Alcohol/week: 8.4 oz    Types: 14 Shots of liquor per week    Comment: occasional  . Drug use: No  . Sexual activity: No  Other Topics Concern  . Not on file  Social History Narrative  . Not on file   The PMH, PSH, Social History, Family History, Medications, and allergies have been reviewed in [facility], and have been updated if relevant.   Review of Systems  Constitutional: Negative.   Cardiovascular: Positive for palpitations. Negative for chest pain and leg swelling.  Gastrointestinal: Positive for abdominal distention, abdominal pain, nausea and vomiting. Negative for anal bleeding, blood in stool, constipation, diarrhea and rectal pain.  All other systems reviewed and are negative.      Objective:    BP (!) 150/96 (BP Location: Left Arm, Patient Position: Sitting, Cuff Size: Normal)   Pulse 86   Temp 98.7 F (37.1 C) (Oral)   Ht 5\' 2"  (1.575 m)   Wt 151 lb 9.6 oz (68.8 kg)   SpO2 96%   BMI 27.73 kg/m    Physical Exam  Constitutional: She is oriented to person, place, and time. She appears well-developed and  well-nourished. No distress.  HENT:  Head: Normocephalic and atraumatic.  Eyes: Conjunctivae are normal.  Neck: Normal range of motion.  Cardiovascular: Normal rate and regular rhythm.  Pulmonary/Chest: Effort normal and breath sounds normal.  Abdominal: Soft. She exhibits no distension and no mass. There is tenderness. There is no rebound and no guarding.  Musculoskeletal: Normal range of motion.  Neurological: She is alert  and oriented to person, place, and time. No cranial nerve deficit.  Skin: Skin is warm and dry. She is not diaphoretic.  Psychiatric: She has a normal mood and affect. Her behavior is normal. Judgment and thought content normal.  Nursing note and vitals reviewed.         Assessment & Plan:   Palpitations - Plan: EKG 12-Lead  Epigastric pain No Follow-up on file.

## 2017-07-27 NOTE — Patient Instructions (Signed)
Great to see you.  Please take protonix 40 mg twice daily for two weeks, then change to 40 mg daily thereafter.  Please also take carafate four times daily for next 2 weeks.  Please come see me in 2-4 weeks, sooner if symptoms are not improving.

## 2017-07-28 ENCOUNTER — Telehealth: Payer: Self-pay | Admitting: Family Medicine

## 2017-07-28 NOTE — Telephone Encounter (Signed)
Copied from [place] [id number]. Topic: Quick Communication - Rx Refill/Question >> Jul 28, 2017 11:20 AM [name] wrote: Medication: sucralfate (CARAFATE) 1 GM/10ML suspension [[id number]]    Has the patient contacted their pharmacy? Yes.     Pt called b/c this Rx is not covered by insurance and is needing an alternate medication, contact pt to advise

## 2017-07-28 NOTE — Telephone Encounter (Signed)
Sent a note to Dr. Deborra Medina after spoke with pt about labs/will call pt with response/thx dmf

## 2017-07-29 ENCOUNTER — Ambulatory Visit: Payer: Self-pay

## 2017-07-29 NOTE — Telephone Encounter (Signed)
  Answer Assessment - Initial Assessment Questions 1. REASON FOR CALL or QUESTION: "What is your reason for calling today?" or "How can I best help you?" or "What question do you have that I can help answer?"     Pt wanted to know when to take the Carafate. Per Micromedex, tale 1 hour before meals and at bedtime. Pt wanted to know what Protonix was and when to take it. Pt informed that it cuts down the acid and she could take it right before eating.  Protocols used: INFORMATION ONLY CALL-A-AH

## 2017-08-10 ENCOUNTER — Other Ambulatory Visit: Payer: Self-pay | Admitting: Family Medicine

## 2017-08-17 ENCOUNTER — Ambulatory Visit: Payer: BLUE CROSS/BLUE SHIELD | Admitting: Family Medicine

## 2017-08-17 VITALS — BP 140/90 | HR 90 | Temp 98.5°F | Ht 62.0 in | Wt 158.0 lb

## 2017-08-17 DIAGNOSIS — K219 Gastro-esophageal reflux disease without esophagitis: Secondary | ICD-10-CM | POA: Insufficient documentation

## 2017-08-17 DIAGNOSIS — M1990 Unspecified osteoarthritis, unspecified site: Secondary | ICD-10-CM | POA: Diagnosis not present

## 2017-08-17 DIAGNOSIS — K2100 Gastro-esophageal reflux disease with esophagitis, without bleeding: Secondary | ICD-10-CM

## 2017-08-17 MED ORDER — PANTOPRAZOLE SODIUM 40 MG PO TBEC: 40.0000 mg | DELAYED_RELEASE_TABLET | Freq: Two times a day (BID) | ORAL | 1 refills | Status: DC

## 2017-08-17 MED ORDER — IBUPROFEN 800 MG PO TABS: 800.0000 mg | ORAL_TABLET | Freq: Three times a day (TID) | ORAL | 3 refills | Status: DC | PRN

## 2017-08-17 NOTE — Assessment & Plan Note (Signed)
>  25 minutes spent in face to face time with patient, >50% spent in counselling or coordination of care discussing GERD and arthritis. Tylenol has been ineffective in treating her arthritis. We discussed options and agreed to try Ibuprofen 800 mg twice daily with food and to continue PPI at the same time. Call or return to clinic prn if these symptoms worsen or fail to improve as anticipated. The patient indicates understanding of these issues and agrees with the plan.

## 2017-08-17 NOTE — Patient Instructions (Signed)
Great to see you Take Ibuprofen 800 mg once or twice daily (with food). Continue protonix 40 mg daily.  Keep me updated.

## 2017-08-17 NOTE — Progress Notes (Signed)
Subjective:   Patient ID: Sarah Weeks, female    DOB: 11/15/1955, 62 y.o.   MRN: [id number]  Sarah Weeks is a pleasant 62 y.o. year old female who presents to clinic today with Follow-up (On 2.12.19 pt was given Protonix 40mg  bid x 14d then decrease to 1qd there after, also to take Carate which had to be changed to tablet form to take qid x14d but she has been taking it since due to the pharmacist not putting original instructions on the Rx for tablet.  All Sx have subsided and she is doing well.  She no longer take the Ibuprofen so would like to discuss an alternative to that as she has problems with wide-spread pain.  She has tried and fail Celebrex, Tylenol, Ibuprofen. )  on 08/17/2017  HPI:  Follow up from 07/27/17 visit with me. Note reviewed.  At that OV, complaining of epigastric/umbilical pain for 6 days, not relived by pepto bismol.  Endoscopy done 10/16/13- reviewed. It showed erosive esophagitis, gastric ulcer and motility disorder.  I therefore advised that she stop taking NSAIDs and started the following:  Protonix 40 mg twice daily for two weeks, then decrease to 40 mg daily. Also added carafate four times daily- which she just stopped taking. She also stopped taking Ibuprofen.  Her abdominal pain has completely resolved but now her arthritic pain is much worse.    Current Outpatient Medications on File Prior to Visit  Medication Sig Dispense Refill  . B Complex Vitamins (B COMPLEX PO) Take by mouth. Reported on 11/27/2015    . BIOTIN PO Take 10,000 mcg by mouth daily.    . Cholecalciferol (VITAMIN D3) 5000 units CAPS Take 1 capsule by mouth daily.    . diphenhydrAMINE (BENADRYL) 25 mg capsule Take 25 mg every 6 (six) hours as needed by mouth.    . loperamide (IMODIUM A-D) 2 MG tablet Take 2 mg 4 (four) times daily as needed by mouth for diarrhea or loose stools.    . metoprolol succinate (TOPROL-XL) 25 MG 24 hr tablet Take 0.5 tablets (12.5 mg total) by mouth  daily. 30 tablet 3  . Multiple Vitamin (MULTIVITAMIN) tablet Take 2 tablets by mouth daily. Reported on 11/27/2015    . sertraline (ZOLOFT) 50 MG tablet Take 1.5 tablets (75 mg total) by mouth daily. (Patient taking differently: Take 50 mg by mouth daily. ) 45 tablet 3  . zolpidem (AMBIEN) 10 MG tablet Take 1 tablet (10 mg total) by mouth at bedtime as needed. 30 tablet 5   No current facility-administered medications on file prior to visit.     No Known Allergies  Past Medical History:  Diagnosis Date  . Anxiety   . Arthritis   . Breast cancer ([place]) 2003   LT LUMPECTOMY  . Cancer [facility]) 2002   breast cancer left  . GERD (gastroesophageal reflux disease)   . Hay fever   . Hematuria    gross  . HTN (hypertension) 02/01/2017  . Kidney stones   . Personal history of chemotherapy 2003   BREAST CA  . Personal history of radiation therapy 2003   BREAST CA  . S/P chemotherapy, time since greater than 12 weeks    left 2003  . S/P radiation therapy 2003   left breast cancer  . Shortness of breath dyspnea     Past Surgical History:  Procedure Laterality Date  . ANKLE FUSION  2000  . BREAST EXCISIONAL BIOPSY Left 2003   positive  .  BREAST LUMPECTOMY  2002   L side  . CYSTOSCOPY WITH STENT PLACEMENT Right 03/23/2016   Procedure: CYSTOSCOPY WITH STENT PLACEMENT;  Surgeon: Hollice Espy, MD;  Location: [facility] ORS;  Service: Urology;  Laterality: Right;  . URETEROSCOPY WITH HOLMIUM LASER LITHOTRIPSY Right 03/23/2016   Procedure: URETEROSCOPY WITH HOLMIUM LASER LITHOTRIPSY;  Surgeon: Hollice Espy, MD;  Location: [facility] ORS;  Service: Urology;  Laterality: Right;    Family History  Problem Relation Age of Onset  . Cancer Mother   . Breast cancer Mother 69  . Alcohol abuse Father   . Heart disease Father   . Hypertension Father   . Cancer Sister   . Heart disease Sister   . Breast cancer Sister 28  . Heart disease Brother   . Stroke Maternal Grandfather   . Diabetes Maternal  Grandfather   . Kidney cancer Neg Hx   . Kidney disease Neg Hx   . Prostate cancer Neg Hx   . Bladder Cancer Neg Hx     Social History   Socioeconomic History  . Marital status: Divorced    Spouse name: Not on file  . Number of children: Not on file  . Years of education: Not on file  . Highest education level: Not on file  Social Needs  . Financial resource strain: Not on file  . Food insecurity - worry: Not on file  . Food insecurity - inability: Not on file  . Transportation needs - medical: Not on file  . Transportation needs - non-medical: Not on file  Occupational History  . Not on file  Tobacco Use  . Smoking status: Former Smoker    Last attempt to quit: 03/12/2013    Years since quitting: 4.4  . Smokeless tobacco: Never Used  Substance and Sexual Activity  . Alcohol use: Yes    Alcohol/week: 8.4 oz    Types: 14 Shots of liquor per week    Comment: occasional  . Drug use: No  . Sexual activity: No  Other Topics Concern  . Not on file  Social History Narrative  . Not on file   The PMH, PSH, Social History, Family History, Medications, and allergies have been reviewed in [facility], and have been updated if relevant.   Review of Systems  Gastrointestinal: Negative.   Musculoskeletal: Positive for arthralgias.  Skin: Negative.   Psychiatric/Behavioral: Negative.   All other systems reviewed and are negative.      Objective:    BP 140/90 (BP Location: Left Arm, Patient Position: Sitting, Cuff Size: Normal)   Pulse 90   Temp 98.5 F (36.9 C) (Oral)   Ht 5\' 2"  (1.575 m)   Wt 158 lb (71.7 kg)   SpO2 94%   BMI 28.90 kg/m    Physical Exam    General:  Well-developed,well-nourished,in no acute distress; alert,appropriate and cooperative throughout examination Head:  normocephalic and atraumatic.   Eyes:  vision grossly intact, PERRL Ears:  R ear normal and L ear normal externally, TMs clear bilaterally Nose:  no external deformity.   Mouth:  good  dentition.   Neck:  No deformities, masses, or tenderness noted. Lungs:  Normal respiratory effort, chest expands symmetrically. Lungs are clear to auscultation, no crackles or wheezes. Heart:  Normal rate and regular rhythm. S1 and S2 normal without gallop, murmur, click, rub or other extra sounds. Msk:  No deformity or scoliosis noted of thoracic or lumbar spine.   Extremities:  No clubbing, cyanosis, edema, or deformity noted with  normal full range of motion of all joints.   Neurologic:  alert & oriented X3 and gait normal.   Skin:  Intact without suspicious lesions or rashes Psych:  Cognition and judgment appear intact. Alert and cooperative with normal attention span and concentration. No apparent delusions, illusions, hallucinations      Assessment & Plan:   Arthritis  Gastroesophageal reflux disease with esophagitis No Follow-up on file.

## 2017-10-02 ENCOUNTER — Other Ambulatory Visit: Payer: Self-pay

## 2017-10-02 ENCOUNTER — Emergency Department
Admission: EM | Admit: 2017-10-02 | Discharge: 2017-10-02 | Disposition: A | Discharge status: Home / Self Care | Payer: BLUE CROSS/BLUE SHIELD | Attending: Emergency Medicine | Admitting: Emergency Medicine

## 2017-10-02 DIAGNOSIS — F1010 Alcohol abuse, uncomplicated: Secondary | ICD-10-CM | POA: Diagnosis not present

## 2017-10-02 DIAGNOSIS — R1110 Vomiting, unspecified: Secondary | ICD-10-CM | POA: Diagnosis not present

## 2017-10-02 DIAGNOSIS — Z853 Personal history of malignant neoplasm of breast: Secondary | ICD-10-CM | POA: Diagnosis not present

## 2017-10-02 DIAGNOSIS — Z87891 Personal history of nicotine dependence: Secondary | ICD-10-CM | POA: Diagnosis not present

## 2017-10-02 DIAGNOSIS — Z79899 Other long term (current) drug therapy: Secondary | ICD-10-CM | POA: Diagnosis not present

## 2017-10-02 DIAGNOSIS — Z9221 Personal history of antineoplastic chemotherapy: Secondary | ICD-10-CM | POA: Diagnosis not present

## 2017-10-02 DIAGNOSIS — I10 Essential (primary) hypertension: Secondary | ICD-10-CM | POA: Insufficient documentation

## 2017-10-02 DIAGNOSIS — R109 Unspecified abdominal pain: Secondary | ICD-10-CM

## 2017-10-02 DIAGNOSIS — R197 Diarrhea, unspecified: Secondary | ICD-10-CM | POA: Diagnosis not present

## 2017-10-02 DIAGNOSIS — Z923 Personal history of irradiation: Secondary | ICD-10-CM | POA: Diagnosis not present

## 2017-10-02 DIAGNOSIS — R1111 Vomiting without nausea: Secondary | ICD-10-CM

## 2017-10-02 LAB — COMPREHENSIVE METABOLIC PANEL
ALK PHOS: 75 U/L (ref 38–126)
ALT: 18 U/L (ref 14–54)
AST: 28 U/L (ref 15–41)
Albumin: 4.2 g/dL (ref 3.5–5.0)
Anion gap: 11 (ref 5–15)
BILIRUBIN TOTAL: 1.1 mg/dL (ref 0.3–1.2)
BUN: 29 mg/dL — AB (ref 6–20)
CALCIUM: 8.4 mg/dL — AB (ref 8.9–10.3)
CO2: 25 mmol/L (ref 22–32)
CREATININE: 0.88 mg/dL (ref 0.44–1.00)
Chloride: 101 mmol/L (ref 101–111)
GFR calc Af Amer: >60 mL/min (ref >60)
GFR calc non Af Amer: >60 mL/min (ref >60)
GLUCOSE: 103 mg/dL — AB (ref 65–99)
POTASSIUM: 3.9 mmol/L (ref 3.5–5.1)
Sodium: 137 mmol/L (ref 135–145)
TOTAL PROTEIN: 7.5 g/dL (ref 6.5–8.1)

## 2017-10-02 LAB — CBC WITH DIFFERENTIAL/PLATELET
BASOS ABS: 0.1 10*3/uL (ref 0–0.1)
Basophils Relative: 1 %
Eosinophils Absolute: 0 10*3/uL (ref 0–0.7)
Eosinophils Relative: 0 %
HEMATOCRIT: 40.6 % (ref 35.0–47.0)
HEMOGLOBIN: 14 g/dL (ref 12.0–16.0)
LYMPHS PCT: 20 %
Lymphs Abs: 1.5 10*3/uL (ref 1.0–3.6)
MCH: 34.8 pg — ABNORMAL HIGH (ref 26.0–34.0)
MCHC: 34.6 g/dL (ref 32.0–36.0)
MCV: 100.7 fL — ABNORMAL HIGH (ref 80.0–100.0)
MONO ABS: 0.8 10*3/uL (ref 0.2–0.9)
Monocytes Relative: 11 %
NEUTROS ABS: 5.3 10*3/uL (ref 1.4–6.5)
NEUTROS PCT: 68 %
Platelets: 282 10*3/uL (ref 150–440)
RBC: 4.03 MIL/uL (ref 3.80–5.20)
RDW: 13.3 % (ref 11.5–14.5)
WBC: 7.8 10*3/uL (ref 3.6–11.0)

## 2017-10-02 LAB — TROPONIN I: Troponin I: <0.03 ng/mL (ref <0.03)

## 2017-10-02 LAB — LIPASE, BLOOD: LIPASE: 33 U/L (ref 11–51)

## 2017-10-02 MED ORDER — FAMOTIDINE IN NACL 20-0.9 MG/50ML-% IV SOLN: 20.0000 mg | Freq: Two times a day (BID) | INTRAVENOUS | Status: DC

## 2017-10-02 MED ORDER — ONDANSETRON HCL 4 MG PO TABS: 4.0000 mg | ORAL_TABLET | Freq: Three times a day (TID) | ORAL | 0 refills | Status: DC | PRN

## 2017-10-02 MED ORDER — SODIUM CHLORIDE 0.9 % IV BOLUS
1000.0000 mL | Freq: Once | INTRAVENOUS | Status: AC
  Administered 2017-10-02: 1000 mL via INTRAVENOUS

## 2017-10-02 MED ORDER — FAMOTIDINE 20 MG PO TABS: 20.0000 mg | ORAL_TABLET | Freq: Two times a day (BID) | ORAL | 1 refills | Status: DC

## 2017-10-02 MED ORDER — ONDANSETRON HCL 4 MG/2ML IJ SOLN
4.0000 mg | INTRAMUSCULAR | Status: AC
  Administered 2017-10-02: 4 mg via INTRAVENOUS
  Filled 2017-10-02: qty 2

## 2017-10-02 MED ORDER — FAMOTIDINE IN NACL 20-0.9 MG/50ML-% IV SOLN
20.0000 mg | Freq: Once | INTRAVENOUS | Status: AC
  Administered 2017-10-02: 20 mg via INTRAVENOUS
  Filled 2017-10-02: qty 50

## 2017-10-02 NOTE — Discharge Instructions (Signed)
Please seek help for your drinking, I think it is excessive and causing some of your symptoms, do not quit cold [place] but rather gradually cut it out as we discussed.  In addition, please talk to your pain specialist about your use of ibuprofen as this is terrible for your stomach.  If you have increased pain, vomiting, fever, blood, black stool, or any other new or worrisome symptoms including bleeding, please return to the emergency department right away.

## 2017-10-02 NOTE — ED Provider Notes (Signed)
[facility] Emergency Department Provider Note  ____________________________________________   I have reviewed the triage vital signs and the nursing notes. Where available I have reviewed prior notes and, if possible and indicated, outside hospital notes.    HISTORY  Chief Complaint Emesis and Abdominal Pain    HPI Sarah Weeks is a 62 y.o. female with a history of ulcers, alcohol abuse, drinks 3 drinks of vodka in a highball glass every day, at least, no history of alcohol withdrawal, does have a history of ulcers from a endoscopy 5 years ago, has chronic daily diarrhea for which she takes Imodium, states that her anxiety has been worse recently.  Her mother was recently discharged from the hospital, she states when this happens her other symptoms get worse including abdominal pain vomiting and diarrhea.  She had abdominal pain and vomiting 2 days ago.  She does not have that anymore.  She states she has not vomited since yesterday.  No bloody vomit no melena no bright red blood per rectum no hematemesis no fever no chills, she had epigastric discomfort which was worse with food and drinking alcohol.  She did nonetheless drink alcohol.  She has not taken her blood pressure medications today.  She is on Protonix which is chronic for her and she states she is compliant with it.  The pain was a cramping discomfort in her stomach which was relieved by vomiting and worsened by food, she no longer has that.  Denies any increase in there might be slightly more.    Past Medical History:  Diagnosis Date  . Anxiety   . Arthritis   . Breast cancer ([place]) 2003   LT LUMPECTOMY  . Cancer [facility]) 2002   breast cancer left  . GERD (gastroesophageal reflux disease)   . Hay fever   . Hematuria    gross  . HTN (hypertension) 02/01/2017  . Kidney stones   . Personal history of chemotherapy 2003   BREAST CA  . Personal history of radiation therapy 2003   BREAST CA  . S/P  chemotherapy, time since greater than 12 weeks    left 2003  . S/P radiation therapy 2003   left breast cancer  . Shortness of breath dyspnea     Patient Active Problem List   Diagnosis Date Noted  . Arthritis 08/17/2017  . GERD (gastroesophageal reflux disease) 08/17/2017  . Abdominal pain 07/27/2017  . HTN (hypertension) 02/01/2017  . Blood pressure elevated without history of HTN 01/18/2017  . Tachycardia 01/18/2017  . Pain in joint, ankle and foot 04/01/2016  . Cobalamin deficiency 01/14/2015  . Alopecia 12/03/2014  . Inflammation of sacroiliac joint ([place]) 09/18/2014  . 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 Gastric ulcer 11/23/2013  . Insomnia 10/26/2013  . Family history of coronary artery disease 08/17/2013  . Anxiety and depression 08/17/2013    Past Surgical History:  Procedure Laterality Date  . ANKLE FUSION  2000  . BREAST EXCISIONAL BIOPSY Left 2003   positive  . BREAST LUMPECTOMY  2002   L side  . CYSTOSCOPY WITH STENT PLACEMENT Right 03/23/2016   Procedure: CYSTOSCOPY WITH STENT PLACEMENT;  Surgeon: Hollice Espy, MD;  Location: [facility] ORS;  Service: Urology;  Laterality: Right;  . URETEROSCOPY WITH HOLMIUM LASER LITHOTRIPSY Right 03/23/2016   Procedure: URETEROSCOPY WITH HOLMIUM LASER LITHOTRIPSY;  Surgeon: Hollice Espy, MD;  Location: [facility] ORS;  Service: Urology;  Laterality: Right;    Prior to Admission medications  Medication Sig Start Date End Date Taking? Authorizing Provider  B Complex Vitamins (B COMPLEX PO) Take by mouth. Reported on 11/27/2015    [provider]  BIOTIN PO Take 10,000 mcg by mouth daily.    [provider]  Cholecalciferol (VITAMIN D3) 5000 units CAPS Take 1 capsule by mouth daily.    [provider]  diphenhydrAMINE (BENADRYL) 25 mg capsule Take 25 mg every 6 (six) hours as needed by mouth.    [provider]  ibuprofen (ADVIL,MOTRIN) 800 MG tablet Take 1 tablet (800 mg  total) by mouth every 8 (eight) hours as needed. 08/17/17   Lucille Passy, MD  loperamide (IMODIUM A-D) 2 MG tablet Take 2 mg 4 (four) times daily as needed by mouth for diarrhea or loose stools.    [provider]  metoprolol succinate (TOPROL-XL) 25 MG 24 hr tablet Take 0.5 tablets (12.5 mg total) by mouth daily. 06/16/17   Lucille Passy, MD  Multiple Vitamin (MULTIVITAMIN) tablet Take 2 tablets by mouth daily. Reported on 11/27/2015    [provider]  pantoprazole (PROTONIX) 40 MG tablet Take 1 tablet (40 mg total) by mouth 2 (two) times daily before a meal. 08/17/17 10/16/17  Lucille Passy, MD  sertraline (ZOLOFT) 50 MG tablet Take 1.5 tablets (75 mg total) by mouth daily. Patient taking differently: Take 50 mg by mouth daily.  06/16/17   Lucille Passy, MD  zolpidem (AMBIEN) 10 MG tablet Take 1 tablet (10 mg total) by mouth at bedtime as needed. 06/16/17   Lucille Passy, MD    Allergies Patient has no known allergies.  Family History  Problem Relation Age of Onset  . Cancer Mother   . Breast cancer Mother 45  . Alcohol abuse Father   . Heart disease Father   . Hypertension Father   . Cancer Sister   . Heart disease Sister   . Breast cancer Sister 58  . Heart disease Brother   . Stroke Maternal Grandfather   . Diabetes Maternal Grandfather   . Kidney cancer Neg Hx   . Kidney disease Neg Hx   . Prostate cancer Neg Hx   . Bladder Cancer Neg Hx     Social History Social History   Tobacco Use  . Smoking status: Former Smoker    Last attempt to quit: 03/12/2013    Years since quitting: 4.5  . Smokeless tobacco: Never Used  Substance Use Topics  . Alcohol use: Yes    Alcohol/week: 8.4 oz    Types: 14 Shots of liquor per week    Comment: occasional  . Drug use: No    Review of Systems Constitutional: No fever/chills Eyes: No visual changes. ENT: No sore throat. No stiff neck no neck pain Cardiovascular: Denies chest pain. Respiratory: Denies shortness of  breath. Gastrointestinal:   See HPI genitourinary: Negative for dysuria. Musculoskeletal: Negative lower extremity swelling Skin: Negative for rash. Neurological: Negative for severe headaches, focal weakness or numbness.   ____________________________________________   PHYSICAL EXAM:  VITAL SIGNS: ED Triage Vitals  Enc Vitals Group     BP 10/02/17 0643 (!) 160/96     Pulse Rate 10/02/17 0643 85     Resp 10/02/17 0643 18     Temp 10/02/17 0643 98.4 F (36.9 C)     Temp Source 10/02/17 0643 Oral     SpO2 10/02/17 0643 96 %     Weight 10/02/17 0644 155 lb (70.3 kg)  Height --      Head Circumference --      Peak Flow --      Pain Score 10/02/17 0644 0     Pain Loc --      Pain Edu? --      Excl. in [place]? --     Constitutional: Alert and oriented. Well appearing and in no acute distress. Eyes: Conjunctivae are normal Head: Atraumatic HEENT: No congestion/rhinnorhea. Mucous membranes are moist.  Oropharynx non-erythematous Neck:   Nontender with no meningismus, no masses, no stridor Cardiovascular: Normal rate, regular rhythm. Grossly normal heart sounds.  Good peripheral circulation. Respiratory: Normal respiratory effort.  No retractions. Lungs CTAB. Abdominal: Soft and nontender. No distention. No guarding no rebound, serial deep palpation to all quadrants of the abdomen do not betray any evidence of tenderness Back:  There is no focal tenderness or step off.  there is no midline tenderness there are no lesions noted. there is no CVA tenderness Musculoskeletal: No lower extremity tenderness, no upper extremity tenderness. No joint effusions, no DVT signs strong distal pulses no edema Neurologic:  Normal speech and language. No gross focal neurologic deficits are appreciated.  Skin:  Skin is warm, dry and intact. No rash noted. Psychiatric: Mood and affect are normal. Speech and behavior are normal.  ____________________________________________   LABS (all labs ordered  are listed, but only abnormal results are displayed)  Labs Reviewed  CBC WITH DIFFERENTIAL/PLATELET - Abnormal; Notable for the following components:      Result Value   MCV 100.7 (*)    MCH 34.8 (*)    All other components within normal limits  COMPREHENSIVE METABOLIC PANEL - Abnormal; Notable for the following components:   Glucose, Bld 103 (*)    BUN 29 (*)    Calcium 8.4 (*)    All other components within normal limits  LIPASE, BLOOD  TROPONIN I    Pertinent labs  results that were available during my care of the patient were reviewed by me and considered in my medical decision making (see chart for details). ____________________________________________  EKG  I personally interpreted any EKGs ordered by me or triage Sinus rhythm rate 83 bpm no acute ST elevation or depression normal axis unremarkable EKG ____________________________________________  RADIOLOGY  Pertinent labs & imaging results that were available during my care of the patient were reviewed by me and considered in my medical decision making (see chart for details). If possible, patient and/or family made aware of any abnormal findings.  No results found. ____________________________________________    PROCEDURES  Procedure(s) performed: None  Procedures  Critical Care performed: None  ____________________________________________   INITIAL IMPRESSION / ASSESSMENT AND PLAN / ED COURSE  Pertinent labs & imaging results that were available during my care of the patient were reviewed by me and considered in my medical decision making (see chart for details).  Patient here with chronic "stomach issues", alcohol abuse, who also takes NSAIDs on a regular basis, despite being counseled not to by her GI doctor, presents today with what sounds like symptoms consistent with alcohol gastritis.  She states that she drinks slightly more because of her anxiety about her mother, and then had some epigastric  abdominal pain vomiting which is now resolved.  I do not think this likely represents ACS PE or dissection, EKG and troponin are negative, abdomen is completely benign considering the patient's symptoms, medical history, and physical examination today, I have low suspicion for cholecystitis or biliary pathology, pancreatitis, perforation  or bowel obstruction, hernia, intra-abdominal abscess, AAA or dissection, volvulus or intussusception, mesenteric ischemia, ischemic gut, pyelonephritis or appendicitis.  We will give her IV fluids as she does appear to be a little bit volume contracted by BUN/creatinine ratio, and we will reevaluate patient's need to tolerate p.o.  I have counseled her extensively about NSAID use which she takes for her chronic ankle pain, and I have excessively counseled her about alcohol.    ____________________________________________   FINAL CLINICAL IMPRESSION(S) / ED DIAGNOSES  Final diagnoses:  None      This chart was dictated using voice recognition software.  Despite best efforts to proofread,  errors can occur which can change meaning.      Schuyler Amor, MD 10/02/17 [phone]

## 2017-10-02 NOTE — ED Notes (Signed)
Tolerated PO intake.

## 2017-10-02 NOTE — ED Notes (Signed)
ED Provider at bedside. 

## 2017-10-02 NOTE — ED Notes (Signed)
Pt given PO ginger ale per request.

## 2017-10-02 NOTE — ED Triage Notes (Signed)
Patient c/o mid/upper abdominal pain X 2 days that has since resolved. Patient multiple emeses and nausea. Patient c/o diarrhea, however, reports she has diarrhea at baseline.

## 2017-10-11 ENCOUNTER — Encounter: Payer: Self-pay | Admitting: Family Medicine

## 2017-10-11 ENCOUNTER — Ambulatory Visit: Payer: BLUE CROSS/BLUE SHIELD | Admitting: Family Medicine

## 2017-10-11 VITALS — BP 170/96 | HR 111 | Temp 98.6°F | Ht 62.0 in | Wt 149.8 lb

## 2017-10-11 DIAGNOSIS — F419 Anxiety disorder, unspecified: Secondary | ICD-10-CM

## 2017-10-11 DIAGNOSIS — F32A Depression, unspecified: Secondary | ICD-10-CM

## 2017-10-11 DIAGNOSIS — I10 Essential (primary) hypertension: Secondary | ICD-10-CM

## 2017-10-11 DIAGNOSIS — F329 Major depressive disorder, single episode, unspecified: Secondary | ICD-10-CM

## 2017-10-11 MED ORDER — SERTRALINE HCL 100 MG PO TABS: 100.0000 mg | ORAL_TABLET | Freq: Every day | ORAL | 3 refills | Status: DC

## 2017-10-11 MED ORDER — ALPRAZOLAM 0.25 MG PO TABS: 0.2500 mg | ORAL_TABLET | Freq: Two times a day (BID) | ORAL | 0 refills | Status: DC | PRN

## 2017-10-11 NOTE — Patient Instructions (Signed)
Great to see you. I am so sorry you are having a tough time. Please cut back on alcohol how we discussed today.  We are increasing your zoloft to 100 mg daily. Xanax as needed for panic attacks and insomnia.

## 2017-10-11 NOTE — Assessment & Plan Note (Signed)
>  25 minutes spent in face to face time with patient, >50% spent in counselling or coordination of care. She is declining psychotherapy.  Has multiple new and worsening stressors which could be causing her worsening symptoms of anxiety and depression. Having panic attacks- advised that she needs to cut back on her alcohol- we discussed how to wean this down as she is self medicating.  She feels she does not need AA and is only drinking to help with anxiety. Will give short course of xanax but did discuss sedation and addiction potential along with it being unsafe to use while drinking alcohol. The patient indicates understanding of these issues and agrees with the plan.

## 2017-10-11 NOTE — Progress Notes (Signed)
Subjective:   Patient ID: Sarah Weeks, female    DOB: [birth year], 62 y.o.   MRN: [id number]  Sarah Weeks is a pleasant 62 y.o. year old female who presents to clinic today with Anxiety (Patient is here today to discuss anxiety issues. She says she is now taking care of her mother who is going through major depression.  Her BP on Easter was 177/106 and she went to the hospital because she though she was having a heart attack.  She basically feels like she did when her sister died.  )  on 17-Oct-2017  HPI:  Anxiety- increased stressors at home- she is now taking care of her elderly mother. She is currently taking Zoloft 75 mg daily. Admits to drinking more alcohol- 3 vodka drinks per day.  She does not think she is an alcoholic but does realize she is self medicating and that it is putting her at risk for developing gastritis and ulcers again.  Denies SI or HI.  More tearful , anxious.  Worried about money.  Having panic attacks.     Current Outpatient Medications on File Prior to Visit  Medication Sig Dispense Refill  . B Complex Vitamins (B COMPLEX PO) Take by mouth. Reported on 11/27/2015    . diphenhydrAMINE (BENADRYL) 25 mg capsule Take 25 mg every 6 (six) hours as needed by mouth.    . loperamide (IMODIUM A-D) 2 MG tablet Take 2 mg 4 (four) times daily as needed by mouth for diarrhea or loose stools.    . metoprolol succinate (TOPROL-XL) 25 MG 24 hr tablet Take 0.5 tablets (12.5 mg total) by mouth daily. 30 tablet 3  . Multiple Vitamin (MULTIVITAMIN) tablet Take 2 tablets by mouth daily. Reported on 11/27/2015    . pantoprazole (PROTONIX) 40 MG tablet Take 1 tablet (40 mg total) by mouth 2 (two) times daily before a meal. 60 tablet 1  . sertraline (ZOLOFT) 50 MG tablet Take 1.5 tablets (75 mg total) by mouth daily. (Patient taking differently: Take 50 mg by mouth daily. ) 45 tablet 3  . zolpidem (AMBIEN) 10 MG tablet Take 1 tablet (10 mg total) by mouth at bedtime as needed.  30 tablet 5   No current facility-administered medications on file prior to visit.     No Known Allergies  Past Medical History:  Diagnosis Date  . Anxiety   . Arthritis   . Breast cancer ([place]) 2003   LT LUMPECTOMY  . Cancer [facility]) 2002   breast cancer left  . GERD (gastroesophageal reflux disease)   . Hay fever   . Hematuria    gross  . HTN (hypertension) 02/01/2017  . Kidney stones   . Personal history of chemotherapy 2003   BREAST CA  . Personal history of radiation therapy 2003   BREAST CA  . S/P chemotherapy, time since greater than 12 weeks    left 2003  . S/P radiation therapy 2003   left breast cancer  . Shortness of breath dyspnea     Past Surgical History:  Procedure Laterality Date  . ANKLE FUSION  2000  . BREAST EXCISIONAL BIOPSY Left 2003   positive  . BREAST LUMPECTOMY  2002   L side  . CYSTOSCOPY WITH STENT PLACEMENT Right 03/23/2016   Procedure: CYSTOSCOPY WITH STENT PLACEMENT;  Surgeon: Hollice Espy, MD;  Location: [facility] ORS;  Service: Urology;  Laterality: Right;  . URETEROSCOPY WITH HOLMIUM LASER LITHOTRIPSY Right 03/23/2016   Procedure: URETEROSCOPY WITH HOLMIUM LASER  LITHOTRIPSY;  Surgeon: Hollice Espy, MD;  Location: [facility] ORS;  Service: Urology;  Laterality: Right;    Family History  Problem Relation Age of Onset  . Cancer Mother   . Breast cancer Mother 79  . Alcohol abuse Father   . Heart disease Father   . Hypertension Father   . Cancer Sister   . Heart disease Sister   . Breast cancer Sister 58  . Heart disease Brother   . Stroke Maternal Grandfather   . Diabetes Maternal Grandfather   . Kidney cancer Neg Hx   . Kidney disease Neg Hx   . Prostate cancer Neg Hx   . Bladder Cancer Neg Hx     Social History   Socioeconomic History  . Marital status: Divorced    Spouse name: Not on file  . Number of children: Not on file  . Years of education: Not on file  . Highest education level: Not on file  Occupational History  . Not  on file  Social Needs  . Financial resource strain: Not on file  . Food insecurity:    Worry: Not on file    Inability: Not on file  . Transportation needs:    Medical: Not on file    Non-medical: Not on file  Tobacco Use  . Smoking status: Former Smoker    Last attempt to quit: 03/12/2013    Years since quitting: 4.5  . Smokeless tobacco: Never Used  Substance and Sexual Activity  . Alcohol use: Yes    Alcohol/week: 8.4 oz    Types: 14 Shots of liquor per week    Comment: occasional  . Drug use: No  . Sexual activity: Never  Lifestyle  . Physical activity:    Days per week: Not on file    Minutes per session: Not on file  . Stress: Not on file  Relationships  . Social connections:    Talks on phone: Not on file    Gets together: Not on file    Attends religious service: Not on file    Active member of club or organization: Not on file    Attends meetings of clubs or organizations: Not on file    Relationship status: Not on file  . Intimate partner violence:    Fear of current or ex partner: Not on file    Emotionally abused: Not on file    Physically abused: Not on file    Forced sexual activity: Not on file  Other Topics Concern  . Not on file  Social History Narrative  . Not on file   The PMH, PSH, Social History, Family History, Medications, and allergies have been reviewed in [facility], and have been updated if relevant.    Review of Systems  Psychiatric/Behavioral: Positive for decreased concentration, dysphoric mood and sleep disturbance. Negative for agitation, behavioral problems, confusion, hallucinations, self-injury and suicidal ideas. The patient is nervous/anxious. The patient is not hyperactive.   All other systems reviewed and are negative.      Objective:    BP (!) 170/96 (BP Location: Left Arm, Cuff Size: Normal)   Pulse (!) 111   Temp 98.6 F (37 C) (Oral)   Ht 5\' 2"  (1.575 m)   Wt 149 lb 12.8 oz (67.9 kg)   SpO2 97%   BMI 27.40 kg/m   BP  Readings from Last 3 Encounters:  10/11/17 (!) 170/96  10/02/17 (!) 161/84  08/17/17 140/90    Physical Exam  Constitutional: She is  oriented to person, place, and time. She appears well-developed and well-nourished. No distress.  HENT:  Head: Normocephalic and atraumatic.  Neck: Normal range of motion.  Cardiovascular: Normal rate.  Pulmonary/Chest: Effort normal.  Musculoskeletal: Normal range of motion.  Neurological: She is alert and oriented to person, place, and time. She displays normal reflexes. No cranial nerve deficit. Coordination normal.  Skin: Skin is warm.  Psychiatric:  tearful  Nursing note and vitals reviewed.         Assessment & Plan:   Anxiety and depression  Essential hypertension No follow-ups on file.

## 2017-10-13 ENCOUNTER — Telehealth: Payer: Self-pay | Admitting: Family Medicine

## 2017-10-13 NOTE — Telephone Encounter (Signed)
TA-Plz see note below/pt tried alprazolam 0.25mg  last hs and it did not work/would like to know what else can be done/plz advise/thx dmf

## 2017-10-13 NOTE — Telephone Encounter (Unsigned)
Copied from [place] [id number]. Topic: Quick Communication - See Telephone Encounter >> Oct 13, 2017 11:45 AM Percell Belt A wrote: CRM for notification. See Telephone encounter for: 10/13/17. Pt called in stated that she tried the ALPRAZolam Duanne Moron) 0.25 MG tablet [[id number]] last night and it did not work.  She did not sleep at all.  She would like to know what else could be done?   Best number -[phone]

## 2017-10-13 NOTE — Telephone Encounter (Signed)
She can try taking two of xanax 0.25 mg tablets (0.5 mg) tonight.

## 2017-10-14 NOTE — Telephone Encounter (Signed)
LMOVM stating that can try and take 2 at night and let us know how that works/PEC can reiterate this to pt if she calls back that can try taking 2 of the Alprazolam 0.25mg  tablets tonight/thx dmf

## 2017-10-19 ENCOUNTER — Encounter: Payer: Self-pay | Admitting: Family Medicine

## 2017-10-19 ENCOUNTER — Ambulatory Visit: Payer: BLUE CROSS/BLUE SHIELD | Admitting: Family Medicine

## 2017-10-19 VITALS — BP 136/98 | HR 88 | Temp 98.1°F | Ht 62.0 in | Wt 149.8 lb

## 2017-10-19 DIAGNOSIS — G4700 Insomnia, unspecified: Secondary | ICD-10-CM | POA: Diagnosis not present

## 2017-10-19 DIAGNOSIS — F419 Anxiety disorder, unspecified: Secondary | ICD-10-CM | POA: Diagnosis not present

## 2017-10-19 DIAGNOSIS — F329 Major depressive disorder, single episode, unspecified: Secondary | ICD-10-CM | POA: Diagnosis not present

## 2017-10-19 DIAGNOSIS — F32A Depression, unspecified: Secondary | ICD-10-CM

## 2017-10-19 MED ORDER — ALPRAZOLAM 0.5 MG PO TABS: 0.2500 mg | ORAL_TABLET | Freq: Two times a day (BID) | ORAL | 0 refills | Status: DC | PRN

## 2017-10-19 NOTE — Progress Notes (Signed)
Subjective:   Patient ID: Sarah Weeks, female    DOB: [birth year], 62 y.o.   MRN: [id number]  Sarah Weeks is a pleasant 62 y.o. year old female who presents to clinic today with Follow-up (Patient is here today for a F/U.  At 4.29.19 visit her Zoloft was increased to 100mg .  She was given an Rx for Xanax 0.25mg  for panic attacks and insomnia and told to decrease ETOH.  On 5.1.19 she called in stating that 1 tab of the xanax was ineffective for sleep.  She was advised to increase that to 2qhs and let us know if there are any issues.  She went back to the 75mg  of Zoloft because it caused her to have tremors but feels ok.  She feels that the xanax has helped her and now deals well.)  on 10/19/2017  HPI:  Last saw her on 10/11/17 for worsening anxiety and depression. Note reviewed.  At that time, she was taking zoloft 75 mg daily and admitted to self medicating with alcohol despite knowing that is putting her at risk for several things, including aggravating her gastritis/PUD.  PHQ9 of 19.  She declined psychotherapy.  Felt her new and worsening stressors were to blame for her new panic attacks and worsening depression.  We therefore increased her zoloft to 100 mg daily. We discussed the importance of cutting back on her alcohol use/attending AA. Given a short course of xanax for as needed panic attacks and insomnia but did explain it is not safe to take this with alcohol.  She called on 10/13/17 stating that 0.25 mg of xanax was not effective and therefore advised to increase to 0.5 mg nightly as needed which has been helping.  Decreased zoloft back down to 75 mg daily because 100 mg daily gave her tremors.  She does feel she is coping much better now.  Has not been drinking vodka anymore.  Does still have a glass of wine in the evening at times.  Has been taking Unisom when she takes xanax.  Afraid to take xanax with ambien.  Current Outpatient Medications on File Prior to Visit    Medication Sig Dispense Refill  . ACETAMINOPHEN PO Take 650 mg by mouth 2 (two) times daily as needed.    . B Complex Vitamins (B COMPLEX PO) Take by mouth. Reported on 11/27/2015    . diphenhydrAMINE (BENADRYL) 25 mg capsule Take 25 mg every 6 (six) hours as needed by mouth.    . loperamide (IMODIUM A-D) 2 MG tablet Take 2 mg 4 (four) times daily as needed by mouth for diarrhea or loose stools.    . metoprolol succinate (TOPROL-XL) 25 MG 24 hr tablet Take 0.5 tablets (12.5 mg total) by mouth daily. 30 tablet 3  . Multiple Vitamin (MULTIVITAMIN) tablet Take 2 tablets by mouth daily. Reported on 11/27/2015    . pantoprazole (PROTONIX) 40 MG tablet Take 1 tablet (40 mg total) by mouth 2 (two) times daily before a meal. 60 tablet 1  . sertraline (ZOLOFT) 100 MG tablet Take 1 tablet (100 mg total) by mouth daily. (Patient taking differently: Take 100 mg by mouth daily. States that she takes 75mg ) 30 tablet 3  . zolpidem (AMBIEN) 10 MG tablet Take 1 tablet (10 mg total) by mouth at bedtime as needed. 30 tablet 5   No current facility-administered medications on file prior to visit.     No Known Allergies  Past Medical History:  Diagnosis Date  . Anxiety   .  Arthritis   . Breast cancer ([place]) 2003   LT LUMPECTOMY  . Cancer [facility]) 2002   breast cancer left  . GERD (gastroesophageal reflux disease)   . Hay fever   . Hematuria    gross  . HTN (hypertension) 02/01/2017  . Kidney stones   . Personal history of chemotherapy 2003   BREAST CA  . Personal history of radiation therapy 2003   BREAST CA  . S/P chemotherapy, time since greater than 12 weeks    left 2003  . S/P radiation therapy 2003   left breast cancer  . Shortness of breath dyspnea     Past Surgical History:  Procedure Laterality Date  . ANKLE FUSION  2000  . BREAST EXCISIONAL BIOPSY Left 2003   positive  . BREAST LUMPECTOMY  2002   L side  . CYSTOSCOPY WITH STENT PLACEMENT Right 03/23/2016   Procedure: CYSTOSCOPY WITH  STENT PLACEMENT;  Surgeon: Hollice Espy, MD;  Location: [facility] ORS;  Service: Urology;  Laterality: Right;  . URETEROSCOPY WITH HOLMIUM LASER LITHOTRIPSY Right 03/23/2016   Procedure: URETEROSCOPY WITH HOLMIUM LASER LITHOTRIPSY;  Surgeon: Hollice Espy, MD;  Location: [facility] ORS;  Service: Urology;  Laterality: Right;    Family History  Problem Relation Age of Onset  . Cancer Mother   . Breast cancer Mother 2  . Alcohol abuse Father   . Heart disease Father   . Hypertension Father   . Cancer Sister   . Heart disease Sister   . Breast cancer Sister 57  . Heart disease Brother   . Stroke Maternal Grandfather   . Diabetes Maternal Grandfather   . Kidney cancer Neg Hx   . Kidney disease Neg Hx   . Prostate cancer Neg Hx   . Bladder Cancer Neg Hx     Social History   Socioeconomic History  . Marital status: Divorced    Spouse name: Not on file  . Number of children: Not on file  . Years of education: Not on file  . Highest education level: Not on file  Occupational History  . Not on file  Social Needs  . Financial resource strain: Not on file  . Food insecurity:    Worry: Not on file    Inability: Not on file  . Transportation needs:    Medical: Not on file    Non-medical: Not on file  Tobacco Use  . Smoking status: Former Smoker    Last attempt to quit: 03/12/2013    Years since quitting: 4.6  . Smokeless tobacco: Never Used  Substance and Sexual Activity  . Alcohol use: Yes    Alcohol/week: 8.4 oz    Types: 14 Shots of liquor per week    Comment: occasional  . Drug use: No  . Sexual activity: Never  Lifestyle  . Physical activity:    Days per week: Not on file    Minutes per session: Not on file  . Stress: Not on file  Relationships  . Social connections:    Talks on phone: Not on file    Gets together: Not on file    Attends religious service: Not on file    Active member of club or organization: Not on file    Attends meetings of clubs or organizations:  Not on file    Relationship status: Not on file  . Intimate partner violence:    Fear of current or ex partner: Not on file    Emotionally abused: Not on file  Physically abused: Not on file    Forced sexual activity: Not on file  Other Topics Concern  . Not on file  Social History Narrative  . Not on file   The PMH, PSH, Social History, Family History, Medications, and allergies have been reviewed in [facility], and have been updated if relevant.     Review of Systems  Respiratory: Negative.   Cardiovascular: Negative.   Gastrointestinal: Negative.   Neurological: Negative.   Hematological: Negative.   Psychiatric/Behavioral: Positive for sleep disturbance. Negative for agitation, behavioral problems, confusion, decreased concentration, dysphoric mood, hallucinations, self-injury and suicidal ideas. The patient is nervous/anxious. The patient is not hyperactive.   All other systems reviewed and are negative.      Objective:    BP (!) 136/98 (BP Location: Left Arm, Cuff Size: Normal)   Pulse 88   Temp 98.1 F (36.7 C) (Oral)   Ht 5\' 2"  (1.575 m)   Wt 149 lb 12.8 oz (67.9 kg)   SpO2 96%   BMI 27.40 kg/m    Physical Exam   General:  Well-developed,well-nourished,in no acute distress; alert,appropriate and cooperative throughout examination Head:  normocephalic and atraumatic.   Eyes:  vision grossly intact, PERRL Ears:  R ear normal and L ear normal externally, TMs clear bilaterally Nose:  no external deformity.   Mouth:  good dentition.   Neck:  No deformities, masses, or tenderness noted. Lungs:  Normal respiratory effort, chest expands symmetrically. Lungs are clear to auscultation, no crackles or wheezes. Heart:  Normal rate and regular rhythm. S1 and S2 normal without gallop, murmur, click, rub or other extra sounds. Msk:  No deformity or scoliosis noted of thoracic or lumbar spine.   Extremities:  No clubbing, cyanosis, edema, or deformity noted with normal full  range of motion of all joints.   Neurologic:  alert & oriented X3 and gait normal.   Skin:  Intact without suspicious lesions or rashes Cervical Nodes:  No lymphadenopathy noted Axillary Nodes:  No palpable lymphadenopathy Psych:  Cognition and judgment appear intact. Alert and cooperative with normal attention span and concentration. No apparent delusions, illusions, hallucinations       Assessment & Plan:   Anxiety and depression  Insomnia, unspecified type No follow-ups on file.

## 2017-10-19 NOTE — Assessment & Plan Note (Signed)
>  25 minutes spent in face to face time with patient, >50% spent in counselling or coordination of care.  Discussed continuing to decrease her alcohol use. She feels much better with as needed xanax and zoloft at current dose. Mom also now on hospice which she feels has taken some of the caregiving burden of off her.

## 2017-10-19 NOTE — Patient Instructions (Addendum)
Great to see you. Please keep me updated. Hang in there.  Even though we've increased you xanax tablet 0.5 mg - please still taking as needed and 0.25 rather than 0.5 mg when you can.

## 2017-10-29 ENCOUNTER — Other Ambulatory Visit: Payer: Self-pay | Admitting: *Deleted

## 2017-10-29 ENCOUNTER — Telehealth: Payer: Self-pay | Admitting: Family Medicine

## 2017-10-29 MED ORDER — PANTOPRAZOLE SODIUM 40 MG PO TBEC: 40.0000 mg | DELAYED_RELEASE_TABLET | Freq: Every day | ORAL | 1 refills | Status: DC

## 2017-10-29 NOTE — Telephone Encounter (Signed)
Rx refilled per protocol- LOV: 08/17/17

## 2017-10-29 NOTE — Telephone Encounter (Unsigned)
Copied from [place] [id number]. Topic: Quick Communication - Rx Refill/Question >> Oct 29, 2017  9:33 AM Yvette Rack wrote: Medication: pantoprazole (PROTONIX) 40 MG tablet   Preferred Pharmacy (with phone number or street name): [facility] [address], [place] [address]  ph# [phone]    Agent: Please be advised that RX refills may take up to 3 business days. We ask that you follow-up with your pharmacy.

## 2017-11-16 ENCOUNTER — Encounter: Payer: Self-pay | Admitting: Family Medicine

## 2017-11-29 ENCOUNTER — Encounter: Payer: Self-pay | Admitting: Family Medicine

## 2017-11-29 ENCOUNTER — Ambulatory Visit: Payer: BLUE CROSS/BLUE SHIELD | Admitting: Family Medicine

## 2017-11-29 VITALS — BP 136/88 | HR 78 | Temp 98.3°F | Ht 62.0 in | Wt 156.4 lb

## 2017-11-29 DIAGNOSIS — G4700 Insomnia, unspecified: Secondary | ICD-10-CM | POA: Diagnosis not present

## 2017-11-29 DIAGNOSIS — F419 Anxiety disorder, unspecified: Secondary | ICD-10-CM | POA: Diagnosis not present

## 2017-11-29 DIAGNOSIS — F329 Major depressive disorder, single episode, unspecified: Secondary | ICD-10-CM

## 2017-11-29 DIAGNOSIS — F32A Depression, unspecified: Secondary | ICD-10-CM

## 2017-11-29 MED ORDER — QUETIAPINE FUMARATE 25 MG PO TABS: 25.0000 mg | ORAL_TABLET | Freq: Every day | ORAL | 3 refills | Status: DC

## 2017-11-29 NOTE — Assessment & Plan Note (Signed)
>  25 minutes spent in face to face time with patient, >50% spent in counselling or coordination of care. D/C ambien. She is no longer drinking. Start seroquel 25 mg qhs. Follow up in month. The patient indicates understanding of these issues and agrees with the plan.

## 2017-11-29 NOTE — Patient Instructions (Addendum)
Great to see you. We are starting Seroquel 25 mg at bedtime - okay to cut in half the first several days.  STOP taking ambien.  Keep me updated.  Come see me in 1 month.

## 2017-11-29 NOTE — Progress Notes (Signed)
Subjective:   Patient ID: Sarah Weeks, female    DOB: 02/29/1956, 62 y.o.   MRN: [id number]  Sarah Weeks is a pleasant 62 y.o. year old female who presents to clinic today with Follow-up (Patient is here today to F/U with medication. She would like to know if ist is ok for her to take Ibuprofen with her ulcer.  She wants to know how you feel about CBD Oils.  She decreased Zoloft back down to 75mg  due to the 100mg  making her feel weird and seeing lines in her eyes which went away with decrease.  Shaking stopped in her hands with the Xanax.  She also started taking the Ambien 5mg  2qhs as a trial (per instructed) and she wakes all through the night.  She won't take the Ambien and the Xanax ) and addendum (at the same time.  She only gets about 3 hours of sleep at a time on the Ambien and wonders if there is an alternative. )  on 11/29/2017  HPI:  Last saw her on 10/19/17 for follow up anxiety and depression.  Note reviewed.  At that time, she was taking zoloft 75 mg daily and admitted to self medicating with alcohol despite knowing that is putting her at risk for several things, including aggravating her gastritis/PUD.  PHQ9 of 19.  She declined psychotherapy.  Felt her new and worsening stressors were to blame for her new panic attacks and worsening depression.  We therefore increased her zoloft to 100 mg daily. We discussed the importance of cutting back on her alcohol use/attending AA. Given a short course of xanax for as needed panic attacks and insomnia but did explain it is not safe to take this with alcohol.  She called on 10/13/17 stating that 0.25 mg of xanax was not effective and therefore advised to increase to 0.5 mg nightly as needed which has been helping.  Decreased zoloft back down to 75 mg daily because 100 mg daily gave her tremors.  She does feel she is coping much better now.  Has not been drinking vodka anymore.  Does still have a glass of wine in the evening at  times.    Afraid to take xanax with ambien.  Still not sleeping well at night.  Current Outpatient Medications on File Prior to Visit  Medication Sig Dispense Refill  . ACETAMINOPHEN PO Take 650 mg by mouth 2 (two) times daily as needed.    . ALPRAZolam (XANAX) 0.5 MG tablet Take 0.5 tablets (0.25 mg total) by mouth 2 (two) times daily as needed for anxiety or sleep. 60 tablet 0  . B Complex Vitamins (B COMPLEX PO) Take by mouth. Reported on 11/27/2015    . diphenhydrAMINE (BENADRYL) 25 mg capsule Take 25 mg every 6 (six) hours as needed by mouth.    . loperamide (IMODIUM A-D) 2 MG tablet Take 2 mg 4 (four) times daily as needed by mouth for diarrhea or loose stools.    . metoprolol succinate (TOPROL-XL) 25 MG 24 hr tablet Take 0.5 tablets (12.5 mg total) by mouth daily. 30 tablet 3  . Multiple Vitamin (MULTIVITAMIN) tablet Take 2 tablets by mouth daily. Reported on 11/27/2015    . pantoprazole (PROTONIX) 40 MG tablet Take 1 tablet (40 mg total) by mouth daily. 30 tablet 1  . sertraline (ZOLOFT) 100 MG tablet Take 1 tablet (100 mg total) by mouth daily. (Patient taking differently: Take 100 mg by mouth daily. States that she takes 75mg ) 30  tablet 3   No current facility-administered medications on file prior to visit.     No Known Allergies  Past Medical History:  Diagnosis Date  . Anxiety   . Arthritis   . Breast cancer ([place]) 2003   LT LUMPECTOMY  . Cancer [facility]) 2002   breast cancer left  . GERD (gastroesophageal reflux disease)   . Hay fever   . Hematuria    gross  . HTN (hypertension) 02/01/2017  . Kidney stones   . Personal history of chemotherapy 2003   BREAST CA  . Personal history of radiation therapy 2003   BREAST CA  . S/P chemotherapy, time since greater than 12 weeks    left 2003  . S/P radiation therapy 2003   left breast cancer  . Shortness of breath dyspnea     Past Surgical History:  Procedure Laterality Date  . ANKLE FUSION  2000  . BREAST EXCISIONAL  BIOPSY Left 2003   positive  . BREAST LUMPECTOMY  2002   L side  . CYSTOSCOPY WITH STENT PLACEMENT Right 03/23/2016   Procedure: CYSTOSCOPY WITH STENT PLACEMENT;  Surgeon: Hollice Espy, MD;  Location: [facility] ORS;  Service: Urology;  Laterality: Right;  . URETEROSCOPY WITH HOLMIUM LASER LITHOTRIPSY Right 03/23/2016   Procedure: URETEROSCOPY WITH HOLMIUM LASER LITHOTRIPSY;  Surgeon: Hollice Espy, MD;  Location: [facility] ORS;  Service: Urology;  Laterality: Right;    Family History  Problem Relation Age of Onset  . Cancer Mother   . Breast cancer Mother 82  . Alcohol abuse Father   . Heart disease Father   . Hypertension Father   . Cancer Sister   . Heart disease Sister   . Breast cancer Sister 54  . Heart disease Brother   . Stroke Maternal Grandfather   . Diabetes Maternal Grandfather   . Kidney cancer Neg Hx   . Kidney disease Neg Hx   . Prostate cancer Neg Hx   . Bladder Cancer Neg Hx     Social History   Socioeconomic History  . Marital status: Divorced    Spouse name: Not on file  . Number of children: Not on file  . Years of education: Not on file  . Highest education level: Not on file  Occupational History  . Not on file  Social Needs  . Financial resource strain: Not on file  . Food insecurity:    Worry: Not on file    Inability: Not on file  . Transportation needs:    Medical: Not on file    Non-medical: Not on file  Tobacco Use  . Smoking status: Former Smoker    Last attempt to quit: 03/12/2013    Years since quitting: 4.7  . Smokeless tobacco: Never Used  Substance and Sexual Activity  . Alcohol use: Yes    Alcohol/week: 8.4 oz    Types: 14 Shots of liquor per week    Comment: occasional  . Drug use: No  . Sexual activity: Never  Lifestyle  . Physical activity:    Days per week: Not on file    Minutes per session: Not on file  . Stress: Not on file  Relationships  . Social connections:    Talks on phone: Not on file    Gets together: Not on  file    Attends religious service: Not on file    Active member of club or organization: Not on file    Attends meetings of clubs or organizations: Not on file  Relationship status: Not on file  . Intimate partner violence:    Fear of current or ex partner: Not on file    Emotionally abused: Not on file    Physically abused: Not on file    Forced sexual activity: Not on file  Other Topics Concern  . Not on file  Social History Narrative  . Not on file   The PMH, PSH, Social History, Family History, Medications, and allergies have been reviewed in [facility], and have been updated if relevant.     Review of Systems  Respiratory: Negative.   Cardiovascular: Negative.   Gastrointestinal: Negative.   Neurological: Negative.   Hematological: Negative.   Psychiatric/Behavioral: Positive for sleep disturbance. Negative for agitation, behavioral problems, confusion, decreased concentration, dysphoric mood, hallucinations, self-injury and suicidal ideas. The patient is nervous/anxious. The patient is not hyperactive.   All other systems reviewed and are negative.      Objective:    BP 136/88 (BP Location: Left Arm, Patient Position: Sitting, Cuff Size: Normal)   Pulse 78   Temp 98.3 F (36.8 C) (Oral)   Ht 5\' 2"  (1.575 m)   Wt 156 lb 6.4 oz (70.9 kg)   SpO2 96%   BMI 28.61 kg/m    Physical Exam   General:  Well-developed,well-nourished,in no acute distress; alert,appropriate and cooperative throughout examination Head:  normocephalic and atraumatic.   Eyes:  vision grossly intact, PERRL Ears:  R ear normal and L ear normal externally, TMs clear bilaterally Nose:  no external deformity.   Mouth:  good dentition.   Neck:  No deformities, masses, or tenderness noted. Lungs:  Normal respiratory effort, chest expands symmetrically. Lungs are clear to auscultation, no crackles or wheezes. Heart:  Normal rate and regular rhythm. S1 and S2 normal without gallop, murmur, click, rub or  other extra sounds. Msk:  No deformity or scoliosis noted of thoracic or lumbar spine.   Extremities:  No clubbing, cyanosis, edema, or deformity noted with normal full range of motion of all joints.   Neurologic:  alert & oriented X3 and gait normal.   Skin:  Intact without suspicious lesions or rashes Cervical Nodes:  No lymphadenopathy noted Axillary Nodes:  No palpable lymphadenopathy Psych:  Cognition and judgment appear intact. Alert and cooperative with normal attention span and concentration. No apparent delusions, illusions, hallucinations       Assessment & Plan:   Insomnia, unspecified type  Anxiety and depression No follow-ups on file.

## 2017-12-11 ENCOUNTER — Other Ambulatory Visit: Payer: Self-pay | Admitting: Family Medicine

## 2017-12-13 NOTE — Telephone Encounter (Signed)
Shadoe Cryan/C Ambien/This was chaged to Quetiapine/thx dmf

## 2017-12-28 ENCOUNTER — Ambulatory Visit: Payer: BLUE CROSS/BLUE SHIELD | Admitting: Family Medicine

## 2017-12-28 ENCOUNTER — Telehealth: Payer: Self-pay | Admitting: Family Medicine

## 2017-12-28 ENCOUNTER — Other Ambulatory Visit: Payer: Self-pay

## 2017-12-28 VITALS — BP 130/74 | HR 92 | Temp 98.5°F | Ht 62.0 in | Wt 156.0 lb

## 2017-12-28 DIAGNOSIS — F419 Anxiety disorder, unspecified: Secondary | ICD-10-CM | POA: Diagnosis not present

## 2017-12-28 DIAGNOSIS — I6529 Occlusion and stenosis of unspecified carotid artery: Secondary | ICD-10-CM | POA: Diagnosis not present

## 2017-12-28 DIAGNOSIS — G4700 Insomnia, unspecified: Secondary | ICD-10-CM

## 2017-12-28 DIAGNOSIS — F329 Major depressive disorder, single episode, unspecified: Secondary | ICD-10-CM | POA: Diagnosis not present

## 2017-12-28 DIAGNOSIS — F32A Depression, unspecified: Secondary | ICD-10-CM

## 2017-12-28 LAB — LIPID PANEL
CHOL/HDL RATIO: 3
Cholesterol: 265 mg/dL — ABNORMAL HIGH (ref 0–200)
HDL: 87.8 mg/dL (ref >39.00)
LDL Cholesterol: 156 mg/dL — ABNORMAL HIGH (ref 0–99)
NONHDL: 176.99
Triglycerides: 106 mg/dL (ref 0.0–149.0)
VLDL: 21.2 mg/dL (ref 0.0–40.0)

## 2017-12-28 MED ORDER — SERTRALINE HCL 50 MG PO TABS: 75.0000 mg | ORAL_TABLET | Freq: Every day | ORAL | 3 refills | Status: DC

## 2017-12-28 MED ORDER — QUETIAPINE FUMARATE 25 MG PO TABS: 25.0000 mg | ORAL_TABLET | Freq: Every day | ORAL | 3 refills | Status: DC

## 2017-12-28 MED ORDER — ALPRAZOLAM 0.5 MG PO TABS: 0.2500 mg | ORAL_TABLET | Freq: Two times a day (BID) | ORAL | 0 refills | Status: DC | PRN

## 2017-12-28 NOTE — Addendum Note (Signed)
Addended by: Lucille Passy on: 12/28/2017 09:39 AM   Modules accepted: Orders

## 2017-12-28 NOTE — Telephone Encounter (Signed)
I called and spoke to patient. Patient given appointment date, time, phone number and location. Patient is scheduled for 01/04/18 @ 2pm at [facility]. Pt verbalized understanding and thanked me for calling.

## 2017-12-28 NOTE — Assessment & Plan Note (Signed)
Bring in CT report from dentist showing some carotid calcifications. Will US carotids, lipid panel.

## 2017-12-28 NOTE — Patient Instructions (Addendum)
Great to see you. I will call you with your lab results from today and you can view them online.    We will call you with your appointment for a carotid ultrasound.

## 2017-12-28 NOTE — Progress Notes (Addendum)
Subjective:   Patient ID: Sarah Weeks, female    DOB: Dec 20, 1955, 62 y.o.   MRN: [id number]  Sarah Weeks is a pleasant 62 y.o. year old female who presents to clinic today with Follow-up (Patient is here today to F/U with Insomnia.  At 6.17.19 OV her Ambien was D/C'ed and she was started on Seroquel 25mg .  She states that she is doing well but she is experiencing some dry-mouth and vivid dreams.)  on 12/28/2017  HPI:  Follow up anxiety, depression and anxiety-  Last month we d/c'd her ambien. Continued her current dose of zoloft and added seroquel 25 mg qhs for persistent anxiety, depression and insomnia. She was unable to take higher dose of zoloft- gave her tremors.  She feels symptoms are improved but she is having more vivid dreams and dry mouth- ? Side effect from seroquel.  Does still take 1/2 xanax most mornings- brings in pill bottle- still has three left and was last filled in 10/2017.  She is not abusing this and only taking when she feels panicked.  Bring in CT report from dentist showing some carotid calcifications.  Current Outpatient Medications on File Prior to Visit  Medication Sig Dispense Refill  . ACETAMINOPHEN PO Take 650 mg by mouth 2 (two) times daily as needed.    . ALPRAZolam (XANAX) 0.5 MG tablet Take 0.5 tablets (0.25 mg total) by mouth 2 (two) times daily as needed for anxiety or sleep. 60 tablet 0  . B Complex Vitamins (B COMPLEX PO) Take by mouth. Reported on 11/27/2015    . diphenhydrAMINE (BENADRYL) 25 mg capsule Take 25 mg every 6 (six) hours as needed by mouth.    . loperamide (IMODIUM A-D) 2 MG tablet Take 2 mg 4 (four) times daily as needed by mouth for diarrhea or loose stools.    . metoprolol succinate (TOPROL-XL) 25 MG 24 hr tablet Take 0.5 tablets (12.5 mg total) by mouth daily. 30 tablet 3  . Multiple Vitamin (MULTIVITAMIN) tablet Take 2 tablets by mouth daily. Reported on 11/27/2015    . pantoprazole (PROTONIX) 40 MG tablet Take 1 tablet  (40 mg total) by mouth daily. 30 tablet 1  . QUEtiapine (SEROQUEL) 25 MG tablet Take 1 tablet (25 mg total) by mouth at bedtime. 30 tablet 3  . sertraline (ZOLOFT) 100 MG tablet Take 1 tablet (100 mg total) by mouth daily. (Patient taking differently: Take 100 mg by mouth daily. States that she takes 75mg ) 30 tablet 3   No current facility-administered medications on file prior to visit.     No Known Allergies  Past Medical History:  Diagnosis Date  . Anxiety   . Arthritis   . Breast cancer ([place]) 2003   LT LUMPECTOMY  . Cancer [facility]) 2002   breast cancer left  . GERD (gastroesophageal reflux disease)   . Hay fever   . Hematuria    gross  . HTN (hypertension) 02/01/2017  . Kidney stones   . Personal history of chemotherapy 2003   BREAST CA  . Personal history of radiation therapy 2003   BREAST CA  . S/P chemotherapy, time since greater than 12 weeks    left 2003  . S/P radiation therapy 2003   left breast cancer  . Shortness of breath dyspnea     Past Surgical History:  Procedure Laterality Date  . ANKLE FUSION  2000  . BREAST EXCISIONAL BIOPSY Left 2003   positive  . BREAST LUMPECTOMY  2002  L side  . CYSTOSCOPY WITH STENT PLACEMENT Right 03/23/2016   Procedure: CYSTOSCOPY WITH STENT PLACEMENT;  Surgeon: Hollice Espy, MD;  Location: [facility] ORS;  Service: Urology;  Laterality: Right;  . URETEROSCOPY WITH HOLMIUM LASER LITHOTRIPSY Right 03/23/2016   Procedure: URETEROSCOPY WITH HOLMIUM LASER LITHOTRIPSY;  Surgeon: Hollice Espy, MD;  Location: [facility] ORS;  Service: Urology;  Laterality: Right;    Family History  Problem Relation Age of Onset  . Cancer Mother   . Breast cancer Mother 75  . Alcohol abuse Father   . Heart disease Father   . Hypertension Father   . Cancer Sister   . Heart disease Sister   . Breast cancer Sister 29  . Heart disease Brother   . Stroke Maternal Grandfather   . Diabetes Maternal Grandfather   . Kidney cancer Neg Hx   . Kidney disease  Neg Hx   . Prostate cancer Neg Hx   . Bladder Cancer Neg Hx     Social History   Socioeconomic History  . Marital status: Divorced    Spouse name: Not on file  . Number of children: Not on file  . Years of education: Not on file  . Highest education level: Not on file  Occupational History  . Not on file  Social Needs  . Financial resource strain: Not on file  . Food insecurity:    Worry: Not on file    Inability: Not on file  . Transportation needs:    Medical: Not on file    Non-medical: Not on file  Tobacco Use  . Smoking status: Former Smoker    Last attempt to quit: 03/12/2013    Years since quitting: 4.8  . Smokeless tobacco: Never Used  Substance and Sexual Activity  . Alcohol use: Yes    Alcohol/week: 8.4 oz    Types: 14 Shots of liquor per week    Comment: occasional  . Drug use: No  . Sexual activity: Never  Lifestyle  . Physical activity:    Days per week: Not on file    Minutes per session: Not on file  . Stress: Not on file  Relationships  . Social connections:    Talks on phone: Not on file    Gets together: Not on file    Attends religious service: Not on file    Active member of club or organization: Not on file    Attends meetings of clubs or organizations: Not on file    Relationship status: Not on file  . Intimate partner violence:    Fear of current or ex partner: Not on file    Emotionally abused: Not on file    Physically abused: Not on file    Forced sexual activity: Not on file  Other Topics Concern  . Not on file  Social History Narrative  . Not on file   The PMH, PSH, Social History, Family History, Medications, and allergies have been reviewed in [facility], and have been updated if relevant.  Review of Systems  Constitutional: Negative.   HENT: Negative for dental problem, drooling, ear discharge, ear pain, facial swelling, hearing loss, mouth sores, rhinorrhea, sinus pressure, sinus pain, sneezing, sore throat, tinnitus, trouble  swallowing and voice change.        + dry mouth  Respiratory: Negative.   Cardiovascular: Negative.   Gastrointestinal: Negative.   Psychiatric/Behavioral: Negative for behavioral problems, self-injury, sleep disturbance and suicidal ideas. The patient is not nervous/anxious and is not hyperactive.        +  vivid dreams  All other systems reviewed and are negative.      Objective:    BP 130/74 (BP Location: Left Arm, Patient Position: Sitting, Cuff Size: Normal)   Pulse 92   Temp 98.5 F (36.9 C) (Oral)   Ht 5\' 2"  (1.575 m)   Wt 156 lb (70.8 kg)   SpO2 95%   BMI 28.53 kg/m    Physical Exam  Constitutional: She is oriented to person, place, and time. She appears well-developed and well-nourished. No distress.  HENT:  Head: Normocephalic and atraumatic.  Eyes: EOM are normal.  Neck: Normal range of motion.  Cardiovascular: Normal rate.  Pulmonary/Chest: Effort normal.  Musculoskeletal: Normal range of motion. She exhibits no edema.  Neurological: She is alert and oriented to person, place, and time. No cranial nerve deficit.  Skin: Skin is warm and dry. She is not diaphoretic.  Psychiatric: She has a normal mood and affect. Her behavior is normal. Judgment and thought content normal.  Nursing note and vitals reviewed.         Assessment & Plan:   Anxiety and depression  Insomnia, unspecified type No follow-ups on file.

## 2017-12-28 NOTE — Progress Notes (Signed)
Order needed to be changed to [id number] per [facility]/thx dmf

## 2017-12-28 NOTE — Assessment & Plan Note (Addendum)
>  25 minutes spent in face to face time with patient, >50% spent in counselling or coordination of care discussing anxiety, depression and insomnia. Continues to refuse psychotherapy at this time.  She does feel better.  Sleeping much better with seroquel.  Still having some panic attacks. Continue zoloft 25 mg daily, xanax sparingly. Follow up as needed.

## 2017-12-30 ENCOUNTER — Telehealth: Payer: Self-pay

## 2017-12-30 DIAGNOSIS — E785 Hyperlipidemia, unspecified: Secondary | ICD-10-CM

## 2017-12-30 NOTE — Telephone Encounter (Signed)
-----   Message from Lucille Passy, MD sent at 12/28/2017  2:56 PM EDT ----- Please call pt- I would recommend starting a statin- her LDL cholesterol is high and given what her dentist found on CT (carotid calcifications/atherosclerosis), I do think it is a good idea. Please let me know if she is okay with this and I will send in rx.

## 2017-12-30 NOTE — Telephone Encounter (Signed)
LMOVM stating that according to the lab results Dr. Deborra Medina is in favor of starting a medication and to call and let us know if she is ok with this/Cholesterol medication/thx dmf

## 2017-12-30 NOTE — Telephone Encounter (Signed)
Noted! Thank you

## 2017-12-31 MED ORDER — ROSUVASTATIN CALCIUM 5 MG PO TABS: 5.0000 mg | ORAL_TABLET | Freq: Every day | ORAL | 3 refills | Status: DC

## 2017-12-31 NOTE — Addendum Note (Signed)
Addended by: Lucille Passy on: 12/31/2017 05:02 PM   Modules accepted: Orders

## 2017-12-31 NOTE — Telephone Encounter (Signed)
Crestor eRx sent.  Please schedule fasting lipid panel and CMET in 8 weeks.  Thank you.

## 2017-12-31 NOTE — Telephone Encounter (Signed)
TA-Pt in agreement with starting a statin/Plz advise and I can send to pharmacy if you like/thx dmf

## 2018-01-04 ENCOUNTER — Ambulatory Visit (INDEPENDENT_AMBULATORY_CARE_PROVIDER_SITE_OTHER): Payer: BLUE CROSS/BLUE SHIELD

## 2018-01-04 DIAGNOSIS — I6529 Occlusion and stenosis of unspecified carotid artery: Secondary | ICD-10-CM

## 2018-01-05 NOTE — Telephone Encounter (Signed)
LMOVM for pt to call and schedule a lab visit fasting for 8-weeks-out/PEC please schedule this when she calls  Created future order for CMP/Lipid/thx dmf

## 2018-01-05 NOTE — Addendum Note (Signed)
Addended by: Marrion Coy on: 01/05/2018 03:18 PM   Modules accepted: Orders

## 2018-01-10 ENCOUNTER — Other Ambulatory Visit: Payer: Self-pay | Admitting: Family Medicine

## 2018-02-24 ENCOUNTER — Ambulatory Visit: Payer: BLUE CROSS/BLUE SHIELD | Admitting: Family Medicine

## 2018-02-24 ENCOUNTER — Encounter: Payer: Self-pay | Admitting: Family Medicine

## 2018-02-24 VITALS — BP 144/98 | HR 94 | Temp 98.0°F | Ht 62.0 in | Wt 159.8 lb

## 2018-02-24 DIAGNOSIS — F4321 Adjustment disorder with depressed mood: Secondary | ICD-10-CM | POA: Diagnosis not present

## 2018-02-24 DIAGNOSIS — I10 Essential (primary) hypertension: Secondary | ICD-10-CM

## 2018-02-24 DIAGNOSIS — Z23 Encounter for immunization: Secondary | ICD-10-CM | POA: Diagnosis not present

## 2018-02-24 MED ORDER — METOPROLOL SUCCINATE ER 25 MG PO TB24: 25.0000 mg | ORAL_TABLET | Freq: Every day | ORAL | 3 refills | Status: DC

## 2018-02-24 NOTE — Progress Notes (Signed)
Subjective:   Patient ID: Sarah Weeks, female    DOB: 27-Jan-1956, 62 y.o.   MRN: [id number]  Sarah Weeks is a pleasant 62 y.o. year old female who presents to clinic today with Hypertension (Patient is here today C/O elevated BP.  On 8.30.19 while at dental appt she was advised that it was too high and they refused to do the fillings.  BP readings were 131/106; 147/103; 132/105.  Pt agrees to get flu shot today.)  on 02/24/2018  HPI:  HTN- BP was elevated at her dentist office on 02/11/18 in 1302-140s/100s.  Dentist refused to do her fillings because of her elevated BP until she saw me.  It has been elevated at home too.  She has been caring for her sick mother who unfortunately recently passed away.  She feels she is coping okay.  Tearful and misses her but she knows her mom was in pain.  She feels maybe that worrying about her mom and taking care of her could be causing her blood pressure increase.  Currently taking Toprol XL 12.5 mg daily. Has had head aches.  Denies blurred vision, CP or SOB.  Current Outpatient Medications on File Prior to Visit  Medication Sig Dispense Refill  . ACETAMINOPHEN PO Take 650 mg by mouth 2 (two) times daily as needed.    . ALPRAZolam (XANAX) 0.5 MG tablet Take 0.5 tablets (0.25 mg total) by mouth 2 (two) times daily as needed for anxiety or sleep. 60 tablet 0  . B Complex Vitamins (B COMPLEX PO) Take by mouth. Reported on 11/27/2015    . diphenhydrAMINE (BENADRYL) 25 mg capsule Take 25 mg every 6 (six) hours as needed by mouth.    . loperamide (IMODIUM A-D) 2 MG tablet Take 2 mg 4 (four) times daily as needed by mouth for diarrhea or loose stools.    . metoprolol succinate (TOPROL-XL) 25 MG 24 hr tablet Take 0.5 tablets (12.5 mg total) by mouth daily. 30 tablet 3  . Multiple Vitamin (MULTIVITAMIN) tablet Take 2 tablets by mouth daily. Reported on 11/27/2015    . pantoprazole (PROTONIX) 40 MG tablet TAKE 1 TABLET BY MOUTH DAILY 30 tablet 1  .  QUEtiapine (SEROQUEL) 25 MG tablet Take 1 tablet (25 mg total) by mouth at bedtime. 30 tablet 3  . rosuvastatin (CRESTOR) 5 MG tablet Take 1 tablet (5 mg total) by mouth daily. 90 tablet 3  . sertraline (ZOLOFT) 50 MG tablet Take 1.5 tablets (75 mg total) by mouth daily. 45 tablet 3   No current facility-administered medications on file prior to visit.     No Known Allergies  Past Medical History:  Diagnosis Date  . Anxiety   . Arthritis   . Breast cancer ([place]) 2003   LT LUMPECTOMY  . Cancer [facility]) 2002   breast cancer left  . GERD (gastroesophageal reflux disease)   . Hay fever   . Hematuria    gross  . HTN (hypertension) 02/01/2017  . Kidney stones   . Personal history of chemotherapy 2003   BREAST CA  . Personal history of radiation therapy 2003   BREAST CA  . S/P chemotherapy, time since greater than 12 weeks    left 2003  . S/P radiation therapy 2003   left breast cancer  . Shortness of breath dyspnea     Past Surgical History:  Procedure Laterality Date  . ANKLE FUSION  2000  . BREAST EXCISIONAL BIOPSY Left 2003   positive  .  BREAST LUMPECTOMY  2002   L side  . CYSTOSCOPY WITH STENT PLACEMENT Right 03/23/2016   Procedure: CYSTOSCOPY WITH STENT PLACEMENT;  Surgeon: Hollice Espy, MD;  Location: [facility] ORS;  Service: Urology;  Laterality: Right;  . URETEROSCOPY WITH HOLMIUM LASER LITHOTRIPSY Right 03/23/2016   Procedure: URETEROSCOPY WITH HOLMIUM LASER LITHOTRIPSY;  Surgeon: Hollice Espy, MD;  Location: [facility] ORS;  Service: Urology;  Laterality: Right;    Family History  Problem Relation Age of Onset  . Cancer Mother   . Breast cancer Mother 67  . Alcohol abuse Father   . Heart disease Father   . Hypertension Father   . Cancer Sister   . Heart disease Sister   . Breast cancer Sister 67  . Heart disease Brother   . Stroke Maternal Grandfather   . Diabetes Maternal Grandfather   . Kidney cancer Neg Hx   . Kidney disease Neg Hx   . Prostate cancer Neg Hx     . Bladder Cancer Neg Hx     Social History   Socioeconomic History  . Marital status: Divorced    Spouse name: Not on file  . Number of children: Not on file  . Years of education: Not on file  . Highest education level: Not on file  Occupational History  . Not on file  Social Needs  . Financial resource strain: Not on file  . Food insecurity:    Worry: Not on file    Inability: Not on file  . Transportation needs:    Medical: Not on file    Non-medical: Not on file  Tobacco Use  . Smoking status: Former Smoker    Last attempt to quit: 03/12/2013    Years since quitting: 4.9  . Smokeless tobacco: Never Used  Substance and Sexual Activity  . Alcohol use: Yes    Alcohol/week: 14.0 standard drinks    Types: 14 Shots of liquor per week    Comment: occasional  . Drug use: No  . Sexual activity: Never  Lifestyle  . Physical activity:    Days per week: Not on file    Minutes per session: Not on file  . Stress: Not on file  Relationships  . Social connections:    Talks on phone: Not on file    Gets together: Not on file    Attends religious service: Not on file    Active member of club or organization: Not on file    Attends meetings of clubs or organizations: Not on file    Relationship status: Not on file  . Intimate partner violence:    Fear of current or ex partner: Not on file    Emotionally abused: Not on file    Physically abused: Not on file    Forced sexual activity: Not on file  Other Topics Concern  . Not on file  Social History Narrative  . Not on file   The PMH, PSH, Social History, Family History, Medications, and allergies have been reviewed in [facility], and have been updated if relevant.   Review of Systems  Constitutional: Negative.   HENT: Negative.   Respiratory: Negative.   Cardiovascular: Negative.   Genitourinary: Negative.   Neurological: Positive for headaches. Negative for dizziness, tremors, seizures, syncope, facial asymmetry, speech  difficulty, weakness, light-headedness and numbness.  Hematological: Negative.   Psychiatric/Behavioral: Positive for dysphoric mood and sleep disturbance. Negative for self-injury and suicidal ideas.  All other systems reviewed and are negative.  Objective:    BP (!) 144/98 (BP Location: Left Arm, Cuff Size: Normal)   Pulse 94   Temp 98 F (36.7 C) (Oral)   Ht 5\' 2"  (1.575 m)   Wt 159 lb 12.8 oz (72.5 kg)   SpO2 97%   BMI 29.23 kg/m    Physical Exam  Constitutional: She is oriented to person, place, and time. She appears well-developed and well-nourished. No distress.  HENT:  Head: Normocephalic and atraumatic.  Neck: Normal range of motion.  Cardiovascular: Normal rate.  Pulmonary/Chest: Effort normal.  Musculoskeletal: Normal range of motion. She exhibits no edema.  Neurological: She is alert and oriented to person, place, and time. No cranial nerve deficit.  Skin: Skin is warm and dry. She is not diaphoretic.  Psychiatric:  Tearful but appropriate  Nursing note and vitals reviewed.         Assessment & Plan:   Need for influenza vaccination - Plan: Flu Vaccine QUAD 6+ mos PF IM (Fluarix Quad PF) No follow-ups on file.

## 2018-02-24 NOTE — Patient Instructions (Signed)
I am so sorry for your loss.  Please increase your Toprol XL to 25 mg daily.  Please come see me in 2 weeks.

## 2018-02-25 NOTE — Assessment & Plan Note (Signed)
Deteriorated likely due to acute stressors but it is persistently elevated and she is symptomatic. Increase Toprol XL to 25 mg daily. Follow up in 2 weeks. The patient indicates understanding of these issues and agrees with the plan.

## 2018-02-25 NOTE — Assessment & Plan Note (Signed)
Appropriate grief reaction. Offered my condolences and support, discussed hospice grief counseling. She does have xanax she is taking as needed. She will follow up with me in 2 weeks (for blood pressure rx changes) but I will follow up on her grief as well.  She will contact me sooner if her symptoms deteriorate. The patient indicates understanding of these issues and agrees with the plan.

## 2018-03-09 ENCOUNTER — Encounter: Payer: Self-pay | Admitting: Family Medicine

## 2018-03-09 ENCOUNTER — Ambulatory Visit: Payer: BLUE CROSS/BLUE SHIELD | Admitting: Family Medicine

## 2018-03-09 VITALS — BP 136/92 | HR 81 | Temp 98.8°F | Ht 62.0 in | Wt 161.6 lb

## 2018-03-09 DIAGNOSIS — E785 Hyperlipidemia, unspecified: Secondary | ICD-10-CM | POA: Diagnosis not present

## 2018-03-09 DIAGNOSIS — F32A Depression, unspecified: Secondary | ICD-10-CM

## 2018-03-09 DIAGNOSIS — F4321 Adjustment disorder with depressed mood: Secondary | ICD-10-CM

## 2018-03-09 DIAGNOSIS — F419 Anxiety disorder, unspecified: Secondary | ICD-10-CM | POA: Diagnosis not present

## 2018-03-09 DIAGNOSIS — I10 Essential (primary) hypertension: Secondary | ICD-10-CM

## 2018-03-09 DIAGNOSIS — F329 Major depressive disorder, single episode, unspecified: Secondary | ICD-10-CM

## 2018-03-09 LAB — COMPREHENSIVE METABOLIC PANEL
ALT: 21 U/L (ref 0–35)
AST: 25 U/L (ref 0–37)
Albumin: 4.1 g/dL (ref 3.5–5.2)
Alkaline Phosphatase: 70 U/L (ref 39–117)
BILIRUBIN TOTAL: 0.4 mg/dL (ref 0.2–1.2)
BUN: 17 mg/dL (ref 6–23)
CO2: 31 mEq/L (ref 19–32)
CREATININE: 0.71 mg/dL (ref 0.40–1.20)
Calcium: 9.3 mg/dL (ref 8.4–10.5)
Chloride: 103 mEq/L (ref 96–112)
GFR: 88.73 mL/min (ref >60.00)
GLUCOSE: 81 mg/dL (ref 70–99)
Potassium: 4.7 mEq/L (ref 3.5–5.1)
SODIUM: 141 meq/L (ref 135–145)
TOTAL PROTEIN: 7 g/dL (ref 6.0–8.3)

## 2018-03-09 LAB — CBC WITH DIFFERENTIAL/PLATELET
BASOS ABS: 0 10*3/uL (ref 0.0–0.1)
Basophils Relative: 0.2 % (ref 0.0–3.0)
Eosinophils Absolute: 0.1 10*3/uL (ref 0.0–0.7)
Eosinophils Relative: 1.6 % (ref 0.0–5.0)
HCT: 35.4 % — ABNORMAL LOW (ref 36.0–46.0)
Hemoglobin: 12.2 g/dL (ref 12.0–15.0)
LYMPHS ABS: 1.4 10*3/uL (ref 0.7–4.0)
Lymphocytes Relative: 25.4 % (ref 12.0–46.0)
MCHC: 34.5 g/dL (ref 30.0–36.0)
MCV: 102.5 fl — ABNORMAL HIGH (ref 78.0–100.0)
MONO ABS: 0.4 10*3/uL (ref 0.1–1.0)
Monocytes Relative: 8.1 % (ref 3.0–12.0)
NEUTROS PCT: 64.7 % (ref 43.0–77.0)
Neutro Abs: 3.5 10*3/uL (ref 1.4–7.7)
Platelets: 240 10*3/uL (ref 150.0–400.0)
RBC: 3.45 Mil/uL — ABNORMAL LOW (ref 3.87–5.11)
RDW: 13.6 % (ref 11.5–15.5)
WBC: 5.4 10*3/uL (ref 4.0–10.5)

## 2018-03-09 LAB — LIPID PANEL
Cholesterol: 206 mg/dL — ABNORMAL HIGH (ref 0–200)
HDL: 91.6 mg/dL (ref >39.00)
NONHDL: 113.93
Total CHOL/HDL Ratio: 2
Triglycerides: 209 mg/dL — ABNORMAL HIGH (ref 0.0–149.0)
VLDL: 41.8 mg/dL — ABNORMAL HIGH (ref 0.0–40.0)

## 2018-03-09 LAB — LDL CHOLESTEROL, DIRECT: LDL DIRECT: 79 mg/dL

## 2018-03-09 MED ORDER — ALPRAZOLAM 0.5 MG PO TABS: 0.2500 mg | ORAL_TABLET | Freq: Two times a day (BID) | ORAL | 0 refills | Status: DC | PRN

## 2018-03-09 NOTE — Assessment & Plan Note (Signed)
Continue psychotherapy, zoloft at current dose. She will decrease as needed xanax once she feels she can (just lost her mother). The patient indicates understanding of these issues and agrees with the plan.

## 2018-03-09 NOTE — Progress Notes (Signed)
Subjective:   Patient ID: Sarah Weeks, female    DOB: Aug 31, 1955, 62 y.o.   MRN: [id number]  Sarah Weeks is a pleasant 62 y.o. year old female who presents to clinic today with Follow-up (Patient is here today for a 2-week-F/U.  At 9.12.19 OV Toprol XL was changed from 12.5mg  to 25mg .  Discussed Hospice Counseling for grief, to take Xanax PNR and RTN in 2 weeks.  She states that she is fasting this morning and due Lipid recheck.  Has not been checking her BP.  She mentiones that she is always tired.  )  on 03/09/2018  HPI:  Here for two week follow up.  Last saw her on 02/24/18- note reviewed.  HTN- BP had been persistently elevated at the dentist office and at home. We therefore increased her Toprol XL from 12/5 mg daily to 25 mg daily.  She has not been checking her BP at home.  Denies any dizziness, CP, SOB or LE edema.  BP Readings from Last 3 Encounters:  03/09/18 (!) 136/92  02/24/18 (!) 144/98  12/28/17 130/74   Anxiety and depression-  Unfortunately, her mother had just passed away when I last saw her.  We did discuss hospice grief counseling.   She feels she is coping okay.  Picking up her ashes today.  Has not reached out to hospice because she feels she is "coming to terms with it."  She still is taking xanax as needed. Feels zoloft 75 mg daily is working better than 50 mg daily.  HLD- due to have cholesterol checked.  She is taking crestor 5 mg daily (strated in 12/2017). Denies myalgias.  Lab Results  Component Value Date   CHOL 265 (H) 12/28/2017   HDL 87.80 12/28/2017   LDLCALC 156 (H) 12/28/2017   TRIG 106.0 12/28/2017   CHOLHDL 3 12/28/2017     Current Outpatient Medications on File Prior to Visit  Medication Sig Dispense Refill  . B Complex Vitamins (B COMPLEX PO) Take by mouth. Reported on 11/27/2015    . diphenhydrAMINE (BENADRYL) 25 mg capsule Take 25 mg every 6 (six) hours as needed by mouth.    . loperamide (IMODIUM A-D) 2 MG tablet Take 2 mg  4 (four) times daily as needed by mouth for diarrhea or loose stools.    . metoprolol succinate (TOPROL-XL) 25 MG 24 hr tablet Take 1 tablet (25 mg total) by mouth daily. 30 tablet 3  . Multiple Vitamin (MULTIVITAMIN) tablet Take 2 tablets by mouth daily. Reported on 11/27/2015    . pantoprazole (PROTONIX) 40 MG tablet TAKE 1 TABLET BY MOUTH DAILY 30 tablet 1  . QUEtiapine (SEROQUEL) 25 MG tablet Take 1 tablet (25 mg total) by mouth at bedtime. 30 tablet 3  . rosuvastatin (CRESTOR) 5 MG tablet Take 1 tablet (5 mg total) by mouth daily. 90 tablet 3  . sertraline (ZOLOFT) 50 MG tablet Take 1.5 tablets (75 mg total) by mouth daily. 45 tablet 3   No current facility-administered medications on file prior to visit.     No Known Allergies  Past Medical History:  Diagnosis Date  . Anxiety   . Arthritis   . Breast cancer ([place]) 2003   LT LUMPECTOMY  . Cancer [facility]) 2002   breast cancer left  . GERD (gastroesophageal reflux disease)   . Hay fever   . Hematuria    gross  . HTN (hypertension) 02/01/2017  . Kidney stones   . Personal history of chemotherapy  2003   BREAST CA  . Personal history of radiation therapy 2003   BREAST CA  . S/P chemotherapy, time since greater than 12 weeks    left 2003  . S/P radiation therapy 2003   left breast cancer  . Shortness of breath dyspnea     Past Surgical History:  Procedure Laterality Date  . ANKLE FUSION  2000  . BREAST EXCISIONAL BIOPSY Left 2003   positive  . BREAST LUMPECTOMY  2002   L side  . CYSTOSCOPY WITH STENT PLACEMENT Right 03/23/2016   Procedure: CYSTOSCOPY WITH STENT PLACEMENT;  Surgeon: Hollice Espy, MD;  Location: [facility] ORS;  Service: Urology;  Laterality: Right;  . URETEROSCOPY WITH HOLMIUM LASER LITHOTRIPSY Right 03/23/2016   Procedure: URETEROSCOPY WITH HOLMIUM LASER LITHOTRIPSY;  Surgeon: Hollice Espy, MD;  Location: [facility] ORS;  Service: Urology;  Laterality: Right;    Family History  Problem Relation Age of Onset  .  Cancer Mother   . Breast cancer Mother 18  . Alcohol abuse Father   . Heart disease Father   . Hypertension Father   . Cancer Sister   . Heart disease Sister   . Breast cancer Sister 59  . Heart disease Brother   . Stroke Maternal Grandfather   . Diabetes Maternal Grandfather   . Kidney cancer Neg Hx   . Kidney disease Neg Hx   . Prostate cancer Neg Hx   . Bladder Cancer Neg Hx     Social History   Socioeconomic History  . Marital status: Divorced    Spouse name: Not on file  . Number of children: Not on file  . Years of education: Not on file  . Highest education level: Not on file  Occupational History  . Not on file  Social Needs  . Financial resource strain: Not on file  . Food insecurity:    Worry: Not on file    Inability: Not on file  . Transportation needs:    Medical: Not on file    Non-medical: Not on file  Tobacco Use  . Smoking status: Former Smoker    Last attempt to quit: 03/12/2013    Years since quitting: 4.9  . Smokeless tobacco: Never Used  Substance and Sexual Activity  . Alcohol use: Yes    Alcohol/week: 14.0 standard drinks    Types: 14 Shots of liquor per week    Comment: occasional  . Drug use: No  . Sexual activity: Never  Lifestyle  . Physical activity:    Days per week: Not on file    Minutes per session: Not on file  . Stress: Not on file  Relationships  . Social connections:    Talks on phone: Not on file    Gets together: Not on file    Attends religious service: Not on file    Active member of club or organization: Not on file    Attends meetings of clubs or organizations: Not on file    Relationship status: Not on file  . Intimate partner violence:    Fear of current or ex partner: Not on file    Emotionally abused: Not on file    Physically abused: Not on file    Forced sexual activity: Not on file  Other Topics Concern  . Not on file  Social History Narrative  . Not on file   The PMH, PSH, Social History, Family  History, Medications, and allergies have been reviewed in [facility], and have been updated  if relevant.   Review of Systems  Constitutional: Negative.   HENT: Negative.   Eyes: Negative.   Respiratory: Negative.   Cardiovascular: Negative.   Gastrointestinal: Negative.   Endocrine: Negative.   Genitourinary: Negative.   Musculoskeletal: Negative.   Skin: Negative.   Allergic/Immunologic: Negative.   Neurological: Negative.   Hematological: Negative.   Psychiatric/Behavioral: Positive for dysphoric mood. Negative for agitation, behavioral problems, confusion, decreased concentration, hallucinations, self-injury, sleep disturbance and suicidal ideas. The patient is nervous/anxious. The patient is not hyperactive.   All other systems reviewed and are negative.      Objective:    BP (!) 136/92 (BP Location: Left Arm, Cuff Size: Normal)   Pulse 81   Temp 98.8 F (37.1 C) (Oral)   Ht 5\' 2"  (1.575 m)   Wt 161 lb 9.6 oz (73.3 kg)   SpO2 95%   BMI 29.56 kg/m    Physical Exam  Constitutional: She is oriented to person, place, and time. She appears well-developed and well-nourished. No distress.  HENT:  Head: Normocephalic and atraumatic.  Eyes: EOM are normal.  Neck: Normal range of motion.  Cardiovascular: Normal rate and regular rhythm.  Pulmonary/Chest: Effort normal and breath sounds normal.  Musculoskeletal: Normal range of motion. She exhibits no edema.  Neurological: She is alert and oriented to person, place, and time. No cranial nerve deficit.  Skin: Skin is warm and dry. She is not diaphoretic.  Psychiatric: She has a normal mood and affect. Her behavior is normal. Judgment and thought content normal.  Nursing note and vitals reviewed.         Assessment & Plan:   Grief  Essential hypertension - Plan: Lipid panel, Comprehensive metabolic panel  Hyperlipidemia, unspecified hyperlipidemia type - Plan: CBC with Differential/Platelet  Anxiety and depression No  follow-ups on file.

## 2018-03-09 NOTE — Assessment & Plan Note (Signed)
Has been compliant with crestor. Check lipid panel, CMET and CBC today. The patient indicates understanding of these issues and agrees with the plan.

## 2018-03-09 NOTE — Patient Instructions (Addendum)
Please schedule a Physical with PAP :)  Great to see you. I will call you with your lab results from today and you can view them online.

## 2018-03-09 NOTE — Assessment & Plan Note (Signed)
Improved.  Continue current dose of Toprol XL. The patient indicates understanding of these issues and agrees with the plan.

## 2018-03-09 NOTE — Assessment & Plan Note (Addendum)
Appropriate response. Offered my continued support and again discussed hospice grief counseling.

## 2018-03-14 ENCOUNTER — Other Ambulatory Visit: Payer: Self-pay

## 2018-03-14 DIAGNOSIS — Z87891 Personal history of nicotine dependence: Secondary | ICD-10-CM | POA: Diagnosis not present

## 2018-03-14 DIAGNOSIS — Y9389 Activity, other specified: Secondary | ICD-10-CM | POA: Insufficient documentation

## 2018-03-14 DIAGNOSIS — S0181XA Laceration without foreign body of other part of head, initial encounter: Secondary | ICD-10-CM | POA: Insufficient documentation

## 2018-03-14 DIAGNOSIS — Y92009 Unspecified place in unspecified non-institutional (private) residence as the place of occurrence of the external cause: Secondary | ICD-10-CM | POA: Insufficient documentation

## 2018-03-14 DIAGNOSIS — S0121XA Laceration without foreign body of nose, initial encounter: Secondary | ICD-10-CM | POA: Insufficient documentation

## 2018-03-14 DIAGNOSIS — Y999 Unspecified external cause status: Secondary | ICD-10-CM | POA: Diagnosis not present

## 2018-03-14 DIAGNOSIS — Z79899 Other long term (current) drug therapy: Secondary | ICD-10-CM | POA: Insufficient documentation

## 2018-03-14 DIAGNOSIS — W540XXA Bitten by dog, initial encounter: Secondary | ICD-10-CM | POA: Insufficient documentation

## 2018-03-14 DIAGNOSIS — I10 Essential (primary) hypertension: Secondary | ICD-10-CM | POA: Insufficient documentation

## 2018-03-14 DIAGNOSIS — Z853 Personal history of malignant neoplasm of breast: Secondary | ICD-10-CM | POA: Insufficient documentation

## 2018-03-14 DIAGNOSIS — Z23 Encounter for immunization: Secondary | ICD-10-CM | POA: Diagnosis not present

## 2018-03-14 NOTE — ED Triage Notes (Signed)
Pt arrives to ED via POV from home with c/o dog bite to the nose and forehead PTA. Pt is owner of dog and UTD on shots. Law enforcement has been notified.

## 2018-03-15 ENCOUNTER — Emergency Department
Admission: EM | Admit: 2018-03-15 | Discharge: 2018-03-15 | Disposition: A | Discharge status: Home / Self Care | Payer: BLUE CROSS/BLUE SHIELD | Attending: Emergency Medicine | Admitting: Emergency Medicine

## 2018-03-15 DIAGNOSIS — S0185XA Open bite of other part of head, initial encounter: Secondary | ICD-10-CM

## 2018-03-15 DIAGNOSIS — C3490 Malignant neoplasm of unspecified part of unspecified bronchus or lung: Secondary | ICD-10-CM

## 2018-03-15 DIAGNOSIS — W540XXA Bitten by dog, initial encounter: Secondary | ICD-10-CM

## 2018-03-15 HISTORY — DX: Malignant neoplasm of unspecified part of unspecified bronchus or lung: C34.90

## 2018-03-15 HISTORY — PX: VIDEO ASSISTED THORACOSCOPY (VATS)/ LOBECTOMY: SHX6169

## 2018-03-15 MED ORDER — TETANUS-DIPHTH-ACELL PERTUSSIS 5-2.5-18.5 LF-MCG/0.5 IM SUSP
INTRAMUSCULAR | Status: AC
  Filled 2018-03-15: qty 0.5

## 2018-03-15 MED ORDER — LIDOCAINE HCL (PF) 1 % IJ SOLN
INTRAMUSCULAR | Status: AC
  Filled 2018-03-15: qty 5

## 2018-03-15 MED ORDER — TETANUS-DIPHTH-ACELL PERTUSSIS 5-2.5-18.5 LF-MCG/0.5 IM SUSP
0.5000 mL | Freq: Once | INTRAMUSCULAR | Status: AC
  Administered 2018-03-15: 0.5 mL via INTRAMUSCULAR

## 2018-03-15 MED ORDER — LIDOCAINE HCL (PF) 1 % IJ SOLN
5.0000 mL | Freq: Once | INTRAMUSCULAR | Status: AC
  Administered 2018-03-15: 5 mL via INTRADERMAL

## 2018-03-15 MED ORDER — AMOXICILLIN-POT CLAVULANATE 875-125 MG PO TABS
ORAL_TABLET | ORAL | Status: AC
  Filled 2018-03-15: qty 1

## 2018-03-15 MED ORDER — AMOXICILLIN-POT CLAVULANATE 875-125 MG PO TABS: 1.0000 | ORAL_TABLET | Freq: Two times a day (BID) | ORAL | 0 refills | Status: AC

## 2018-03-15 MED ORDER — AMOXICILLIN-POT CLAVULANATE 875-125 MG PO TABS
1.0000 | ORAL_TABLET | Freq: Once | ORAL | Status: AC
  Administered 2018-03-15: 1 via ORAL

## 2018-03-15 NOTE — ED Provider Notes (Signed)
[facility] Emergency Department Provider Note ____________________________________________   First MD Initiated Contact with Patient 03/15/18 [phone]     (approximate)  I have reviewed the triage vital signs and the nursing notes.   HISTORY  Chief Complaint Animal Bite    HPI Sarah Weeks is a 62 y.o. female with PMH as noted below who presents with dog bite to her nose and forehead, acute onset 1 hour ago and occurring when she kissed her dog while the dog was asleep.  She states that she is the owner of the dog and it is up-to-date on its vaccinations.  She denies any other injuries.  Past Medical History:  Diagnosis Date  . Anxiety   . Arthritis   . Breast cancer ([place]) 2003   LT LUMPECTOMY  . Cancer [facility]) 2002   breast cancer left  . GERD (gastroesophageal reflux disease)   . Hay fever   . Hematuria    gross  . HTN (hypertension) 02/01/2017  . Kidney stones   . Personal history of chemotherapy 2003   BREAST CA  . Personal history of radiation therapy 2003   BREAST CA  . S/P chemotherapy, time since greater than 12 weeks    left 2003  . S/P radiation therapy 2003   left breast cancer  . Shortness of breath dyspnea     Patient Active Problem List   Diagnosis Date Noted  . HLD (hyperlipidemia) 03/09/2018  . Grief 02/24/2018  . Carotid artery calcification 12/28/2017  . Arthritis 08/17/2017  . GERD (gastroesophageal reflux disease) 08/17/2017  . HTN (hypertension) 02/01/2017  . Tachycardia 01/18/2017  . Pain in joint, ankle and foot 04/01/2016  . Cobalamin deficiency 01/14/2015  . Alopecia 12/03/2014  . 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 Gastric ulcer 11/23/2013  . Insomnia 10/26/2013  . Family history of coronary artery disease 08/17/2013  . Anxiety and depression 08/17/2013    Past Surgical History:  Procedure Laterality Date  . ANKLE FUSION  2000   . BREAST EXCISIONAL BIOPSY Left 2003   positive  . BREAST LUMPECTOMY  2002   L side  . CYSTOSCOPY WITH STENT PLACEMENT Right 03/23/2016   Procedure: CYSTOSCOPY WITH STENT PLACEMENT;  Surgeon: Hollice Espy, MD;  Location: [facility] ORS;  Service: Urology;  Laterality: Right;  . URETEROSCOPY WITH HOLMIUM LASER LITHOTRIPSY Right 03/23/2016   Procedure: URETEROSCOPY WITH HOLMIUM LASER LITHOTRIPSY;  Surgeon: Hollice Espy, MD;  Location: [facility] ORS;  Service: Urology;  Laterality: Right;    Prior to Admission medications   Medication Sig Start Date End Date Taking? Authorizing Provider  ALPRAZolam Duanne Moron) 0.5 MG tablet Take 0.5 tablets (0.25 mg total) by mouth 2 (two) times daily as needed for anxiety or sleep. 03/09/18   Lucille Passy, MD  amoxicillin-clavulanate (AUGMENTIN) 875-125 MG tablet Take 1 tablet by mouth 2 (two) times daily for 7 days. 03/15/18 03/22/18  Arta Silence, MD  B Complex Vitamins (B COMPLEX PO) Take by mouth. Reported on 11/27/2015    [provider]  diphenhydrAMINE (BENADRYL) 25 mg capsule Take 25 mg every 6 (six) hours as needed by mouth.    [provider]  loperamide (IMODIUM A-D) 2 MG tablet Take 2 mg 4 (four) times daily as needed by mouth for diarrhea or loose stools.    [provider]  metoprolol succinate (TOPROL-XL) 25 MG 24 hr tablet Take 1 tablet (25 mg total) by mouth  daily. 02/24/18   Lucille Passy, MD  Multiple Vitamin (MULTIVITAMIN) tablet Take 2 tablets by mouth daily. Reported on 11/27/2015    [provider]  pantoprazole (PROTONIX) 40 MG tablet TAKE 1 TABLET BY MOUTH DAILY 01/10/18   Lucille Passy, MD  QUEtiapine (SEROQUEL) 25 MG tablet Take 1 tablet (25 mg total) by mouth at bedtime. 12/28/17   Lucille Passy, MD  rosuvastatin (CRESTOR) 5 MG tablet Take 1 tablet (5 mg total) by mouth daily. 12/31/17   Lucille Passy, MD  sertraline (ZOLOFT) 50 MG tablet Take 1.5 tablets (75 mg total) by mouth daily. 12/28/17   Lucille Passy, MD     Allergies Patient has no known allergies.  Family History  Problem Relation Age of Onset  . Cancer Mother   . Breast cancer Mother 8  . Alcohol abuse Father   . Heart disease Father   . Hypertension Father   . Cancer Sister   . Heart disease Sister   . Breast cancer Sister 78  . Heart disease Brother   . Stroke Maternal Grandfather   . Diabetes Maternal Grandfather   . Kidney cancer Neg Hx   . Kidney disease Neg Hx   . Prostate cancer Neg Hx   . Bladder Cancer Neg Hx     Social History Social History   Tobacco Use  . Smoking status: Former Smoker    Last attempt to quit: 03/12/2013    Years since quitting: 5.0  . Smokeless tobacco: Never Used  Substance Use Topics  . Alcohol use: Yes    Alcohol/week: 14.0 standard drinks    Types: 14 Shots of liquor per week    Comment: occasional  . Drug use: No    Review of Systems  Constitutional: No fever. Eyes: No eye injury. ENT: Positive for laceration to nose. Cardiovascular: Denies chest pain. Respiratory: Denies shortness of breath. Gastrointestinal: No vomiting.  Genitourinary: Negative for flank pain.  Musculoskeletal: Negative for back pain. Skin: Negative for rash.  Positive for laceration. Neurological: Negative for headache.   ____________________________________________   PHYSICAL EXAM:  VITAL SIGNS: ED Triage Vitals  Enc Vitals Group     BP 03/14/18 2309 (!) 162/104     Pulse Rate 03/14/18 2309 94     Resp 03/14/18 2309 20     Temp 03/14/18 2309 (!) 97.5 F (36.4 C)     Temp Source 03/14/18 2309 Oral     SpO2 03/14/18 2309 95 %     Weight 03/14/18 2308 160 lb (72.6 kg)     Height 03/14/18 2308 5\' 2"  (1.575 m)     Head Circumference --      Peak Flow --      Pain Score 03/14/18 2321 10     Pain Loc --      Pain Edu? --      Excl. in [place]? --     Constitutional: Alert and oriented. Well appearing and in no acute distress. Eyes: Conjunctivae are normal.  Head: 3 mm superficial  laceration to forehead. Nose: 1 cm laceration to left side of the nose just anterior to nare. Mouth/Throat: Mucous membranes are moist.   Neck: Normal range of motion.  Cardiovascular: Good peripheral circulation. Respiratory: Normal respiratory effort.  Gastrointestinal: No distention.  Musculoskeletal: Extremities warm and well perfused.  Neurologic:  Normal speech and language. No gross focal neurologic deficits are appreciated.  Skin:  Skin is warm and dry. No rash noted. Psychiatric: Mood and  affect are normal. Speech and behavior are normal.  ____________________________________________   LABS (all labs ordered are listed, but only abnormal results are displayed)  Labs Reviewed - No data to display ____________________________________________  EKG   ____________________________________________  RADIOLOGY    ____________________________________________   PROCEDURES  Procedure(s) performed: Yes  .Marland KitchenLaceration Repair Date/Time: 03/15/2018 2:31 AM Performed by: Arta Silence, MD Authorized by: Arta Silence, MD   Consent:    Consent obtained:  Verbal   Consent given by:  Patient   Risks discussed:  Infection, pain, retained foreign body, poor cosmetic result and poor wound healing Anesthesia (see MAR for exact dosages):    Anesthesia method:  Local infiltration   Local anesthetic:  Lidocaine 1% w/o epi Laceration details:    Location:  Face   Face location:  Nose   Length (cm):  1   Depth (mm):  5 Repair type:    Repair type:  Simple Exploration:    Hemostasis achieved with:  Direct pressure   Wound exploration: entire depth of wound probed and visualized     Contaminated: no   Treatment:    Area cleansed with:  Saline and Betadine   Amount of cleaning:  Extensive   Irrigation solution:  Sterile saline   Visualized foreign bodies/material removed: no   Skin repair:    Repair method:  Sutures   Suture size:  4-0   Suture material:  Nylon    Suture technique:  Simple interrupted   Number of sutures:  2 Approximation:    Approximation:  Loose Post-procedure details:    Dressing:  Sterile dressing   Patient tolerance of procedure:  Tolerated well, no immediate complications    Critical Care performed: No ____________________________________________   INITIAL IMPRESSION / ASSESSMENT AND PLAN / ED COURSE  Pertinent labs & imaging results that were available during my care of the patient were reviewed by me and considered in my medical decision making (see chart for details).  61 year old female presents with laceration to her nose and a tiny laceration to the forehead after she was bitten by her dog which is up-to-date on its vaccinations.  The forehead laceration is very small and superficial does not require repair.  There is no bleeding.  The laceration to the nose is just over 1 cm in length and about 5 mm deep.  Because of its location it is slightly open.  Although it would not be appropriate to do a close approximation on this type of bite wound, because of the location and nature of the wound I will place a few loose sutures for better cosmesis.  The patient will require antibiotics and tetanus shot.   ____________________________________________   FINAL CLINICAL IMPRESSION(S) / ED DIAGNOSES  Final diagnoses:  Dog bite of face, initial encounter      NEW MEDICATIONS STARTED DURING THIS VISIT:  New Prescriptions   AMOXICILLIN-CLAVULANATE (AUGMENTIN) 875-125 MG TABLET    Take 1 tablet by mouth 2 (two) times daily for 7 days.     Note:  This document was prepared using Dragon voice recognition software and may include unintentional dictation errors.   Arta Silence, MD 03/15/18 [phone]

## 2018-03-15 NOTE — Discharge Instructions (Signed)
Take the antibiotic as prescribed and finish the full course.  Return to the ER immediately for new, worsening, persistent bleeding, swelling, rash, pus drainage, fever, or any other new or worsening symptoms that concern you.

## 2018-03-20 ENCOUNTER — Encounter: Payer: Self-pay | Admitting: Emergency Medicine

## 2018-03-20 ENCOUNTER — Other Ambulatory Visit: Payer: Self-pay

## 2018-03-20 ENCOUNTER — Emergency Department
Admission: EM | Admit: 2018-03-20 | Discharge: 2018-03-20 | Disposition: A | Discharge status: Home / Self Care | Payer: BLUE CROSS/BLUE SHIELD | Attending: Emergency Medicine | Admitting: Emergency Medicine

## 2018-03-20 DIAGNOSIS — W540XXD Bitten by dog, subsequent encounter: Secondary | ICD-10-CM | POA: Insufficient documentation

## 2018-03-20 DIAGNOSIS — S0125XD Open bite of nose, subsequent encounter: Secondary | ICD-10-CM | POA: Diagnosis not present

## 2018-03-20 DIAGNOSIS — Z9221 Personal history of antineoplastic chemotherapy: Secondary | ICD-10-CM | POA: Insufficient documentation

## 2018-03-20 DIAGNOSIS — Z923 Personal history of irradiation: Secondary | ICD-10-CM | POA: Diagnosis not present

## 2018-03-20 DIAGNOSIS — I10 Essential (primary) hypertension: Secondary | ICD-10-CM | POA: Insufficient documentation

## 2018-03-20 DIAGNOSIS — Z79899 Other long term (current) drug therapy: Secondary | ICD-10-CM | POA: Diagnosis not present

## 2018-03-20 DIAGNOSIS — Z4802 Encounter for removal of sutures: Secondary | ICD-10-CM

## 2018-03-20 DIAGNOSIS — Z853 Personal history of malignant neoplasm of breast: Secondary | ICD-10-CM | POA: Diagnosis not present

## 2018-03-20 DIAGNOSIS — Z87891 Personal history of nicotine dependence: Secondary | ICD-10-CM | POA: Diagnosis not present

## 2018-03-20 MED ORDER — DOXYCYCLINE HYCLATE 50 MG PO CAPS: 100.0000 mg | ORAL_CAPSULE | Freq: Two times a day (BID) | ORAL | 0 refills | Status: AC

## 2018-03-20 NOTE — ED Triage Notes (Signed)
Patient states she is here for suture removal from her nose.  Sutures placed here last Monday per patient.  Patient was bitten by her dog.  Covered with bandaid, left intact.

## 2018-03-20 NOTE — ED Notes (Signed)

## 2018-03-20 NOTE — ED Provider Notes (Signed)
[facility] Emergency Department Provider Note  ____________________________________________  Time seen: Approximately 2:21 PM  I have reviewed the triage vital signs and the nursing notes.   HISTORY  Chief Complaint Suture / Staple Removal    HPI Sarah Weeks is a 62 y.o. female that presents to the emergency department for suture removal.  Sutures were placed 6 days ago after a dog bite.  She has been taking Augmentin as prescribed.  She states that there is still some yellow drainage on her bandages.  No fevers.  Past Medical History:  Diagnosis Date  . Anxiety   . Arthritis   . Breast cancer ([place]) 2003   LT LUMPECTOMY  . Cancer [facility]) 2002   breast cancer left  . GERD (gastroesophageal reflux disease)   . Hay fever   . Hematuria    gross  . HTN (hypertension) 02/01/2017  . Kidney stones   . Personal history of chemotherapy 2003   BREAST CA  . Personal history of radiation therapy 2003   BREAST CA  . S/P chemotherapy, time since greater than 12 weeks    left 2003  . S/P radiation therapy 2003   left breast cancer  . Shortness of breath dyspnea     Patient Active Problem List   Diagnosis Date Noted  . HLD (hyperlipidemia) 03/09/2018  . Grief 02/24/2018  . Carotid artery calcification 12/28/2017  . Arthritis 08/17/2017  . GERD (gastroesophageal reflux disease) 08/17/2017  . HTN (hypertension) 02/01/2017  . Tachycardia 01/18/2017  . Pain in joint, ankle and foot 04/01/2016  . Cobalamin deficiency 01/14/2015  . Alopecia 12/03/2014  . Inflammation of sacroiliac joint ([place]) 09/18/2014  . 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 Gastric ulcer 11/23/2013  . Insomnia 10/26/2013  . Family history of coronary artery disease 08/17/2013  . Anxiety and depression 08/17/2013    Past Surgical History:  Procedure Laterality Date  . ANKLE FUSION  2000  . BREAST EXCISIONAL BIOPSY Left 2003   positive  . BREAST LUMPECTOMY   2002   L side  . CYSTOSCOPY WITH STENT PLACEMENT Right 03/23/2016   Procedure: CYSTOSCOPY WITH STENT PLACEMENT;  Surgeon: Hollice Espy, MD;  Location: [facility] ORS;  Service: Urology;  Laterality: Right;  . URETEROSCOPY WITH HOLMIUM LASER LITHOTRIPSY Right 03/23/2016   Procedure: URETEROSCOPY WITH HOLMIUM LASER LITHOTRIPSY;  Surgeon: Hollice Espy, MD;  Location: [facility] ORS;  Service: Urology;  Laterality: Right;    Prior to Admission medications   Medication Sig Start Date End Date Taking? Authorizing Provider  ALPRAZolam Duanne Moron) 0.5 MG tablet Take 0.5 tablets (0.25 mg total) by mouth 2 (two) times daily as needed for anxiety or sleep. 03/09/18   Lucille Passy, MD  amoxicillin-clavulanate (AUGMENTIN) 875-125 MG tablet Take 1 tablet by mouth 2 (two) times daily for 7 days. 03/15/18 03/22/18  Arta Silence, MD  B Complex Vitamins (B COMPLEX PO) Take by mouth. Reported on 11/27/2015    [provider]  diphenhydrAMINE (BENADRYL) 25 mg capsule Take 25 mg every 6 (six) hours as needed by mouth.    [provider]  doxycycline (VIBRAMYCIN) 50 MG capsule Take 2 capsules (100 mg total) by mouth 2 (two) times daily for 10 days. 03/20/18 03/30/18  Laban Emperor, PA-C  loperamide (IMODIUM A-D) 2 MG tablet Take 2 mg 4 (four) times daily as needed by mouth for diarrhea or loose stools.    [provider]  metoprolol succinate (TOPROL-XL) 25 MG 24 hr tablet  Take 1 tablet (25 mg total) by mouth daily. 02/24/18   Lucille Passy, MD  Multiple Vitamin (MULTIVITAMIN) tablet Take 2 tablets by mouth daily. Reported on 11/27/2015    [provider]  pantoprazole (PROTONIX) 40 MG tablet TAKE 1 TABLET BY MOUTH DAILY 01/10/18   Lucille Passy, MD  QUEtiapine (SEROQUEL) 25 MG tablet Take 1 tablet (25 mg total) by mouth at bedtime. 12/28/17   Lucille Passy, MD  rosuvastatin (CRESTOR) 5 MG tablet Take 1 tablet (5 mg total) by mouth daily. 12/31/17   Lucille Passy, MD  sertraline (ZOLOFT) 50 MG  tablet Take 1.5 tablets (75 mg total) by mouth daily. 12/28/17   Lucille Passy, MD    Allergies Patient has no known allergies.  Family History  Problem Relation Age of Onset  . Cancer Mother   . Breast cancer Mother 60  . Alcohol abuse Father   . Heart disease Father   . Hypertension Father   . Cancer Sister   . Heart disease Sister   . Breast cancer Sister 5  . Heart disease Brother   . Stroke Maternal Grandfather   . Diabetes Maternal Grandfather   . Kidney cancer Neg Hx   . Kidney disease Neg Hx   . Prostate cancer Neg Hx   . Bladder Cancer Neg Hx     Social History Social History   Tobacco Use  . Smoking status: Former Smoker    Last attempt to quit: 03/12/2013    Years since quitting: 5.0  . Smokeless tobacco: Never Used  Substance Use Topics  . Alcohol use: Yes    Alcohol/week: 14.0 standard drinks    Types: 14 Shots of liquor per week    Comment: occasional  . Drug use: No     Review of Systems  Constitutional: No fever/chills Gastrointestinal:No nausea, no vomiting.  Musculoskeletal: Negative for musculoskeletal pain. Skin: Negative for rash, ecchymosis.  Positive for laceration.   ____________________________________________   PHYSICAL EXAM:  VITAL SIGNS: ED Triage Vitals  Enc Vitals Group     BP 03/20/18 0929 (!) 160/98     Pulse Rate 03/20/18 0929 (!) 105     Resp 03/20/18 0929 16     Temp 03/20/18 0929 97.9 F (36.6 C)     Temp Source 03/20/18 0929 Oral     SpO2 03/20/18 0929 94 %     Weight 03/20/18 0929 161 lb (73 kg)     Height 03/20/18 0929 5\' 2"  (1.575 m)     Head Circumference --      Peak Flow --      Pain Score 03/20/18 0924 0     Pain Loc --      Pain Edu? --      Excl. in [place]? --      Constitutional: Alert and oriented. Well appearing and in no acute distress. Eyes: Conjunctivae are normal. PERRL. EOMI. Head: Atraumatic. ENT:      Ears:      Nose: No congestion/rhinnorhea.  2 sutures in place to distal left nose.   Mild yellow drainage seeping from wound.  No surrounding erythema.      Mouth/Throat: Mucous membranes are moist.  Neck: No stridor.   Cardiovascular: Normal rate, regular rhythm.  Good peripheral circulation. Respiratory: Normal respiratory effort without tachypnea or retractions. Lungs CTAB. Good air entry to the bases with no decreased or absent breath sounds. Musculoskeletal: Full range of motion to all extremities. No gross deformities appreciated.  Neurologic:  Normal speech and language. No gross focal neurologic deficits are appreciated.  Skin:  Skin is warm, dry.  Psychiatric: Mood and affect are normal. Speech and behavior are normal. Patient exhibits appropriate insight and judgement.   ____________________________________________   LABS (all labs ordered are listed, but only abnormal results are displayed)  Labs Reviewed - No data to display ____________________________________________  EKG   ____________________________________________  RADIOLOGY   No results found.  ____________________________________________    PROCEDURES  Procedure(s) performed:    Procedures  SUTURE REMOVAL Performed by: Laban Emperor  Consent: Verbal consent obtained. Patient identity confirmed: provided demographic data Time out: Immediately prior to procedure a "time out" was called to verify the correct patient, procedure, equipment, support staff and site/side marked as required.  Location details: nose  Wound Appearance: clean  Sutures/Staples Removed: 2  Facility: sutures placed in this facility Patient tolerance: Patient tolerated the procedure well with no immediate complications.    Medications - No data to display   ____________________________________________   INITIAL IMPRESSION / ASSESSMENT AND PLAN / ED COURSE  Pertinent labs & imaging results that were available during my care of the patient were reviewed by me and considered in my medical decision  making (see chart for details).  Review of the [place] CSRS was performed in accordance of the [place] prior to dispensing any controlled drugs.     Patient presents emergency department for suture removal.  Vital signs and exam are reassuring.  Area has some scant yellow drainage to nose so doxycycline was added to patient's antibiotic regimen.  He will continue Augmentin.  Patient will be discharged home with prescriptions for doxycycline. Patient is to follow up with PCP as directed. Patient is given ED precautions to return to the ED for any worsening or new symptoms.     ____________________________________________  FINAL CLINICAL IMPRESSION(S) / ED DIAGNOSES  Final diagnoses:  Visit for suture removal      NEW MEDICATIONS STARTED DURING THIS VISIT:  ED Discharge Orders         Ordered    doxycycline (VIBRAMYCIN) 50 MG capsule  2 times daily     03/20/18 0950              This chart was dictated using voice recognition software/Dragon. Despite best efforts to proofread, errors can occur which can change the meaning. Any change was purely unintentional.    Laban Emperor, PA-C 03/20/18 1443    Delman Kitten, MD 03/20/18 [phone]

## 2018-03-22 ENCOUNTER — Other Ambulatory Visit: Payer: Self-pay | Admitting: Family Medicine

## 2018-03-31 ENCOUNTER — Encounter: Payer: Self-pay | Admitting: Family Medicine

## 2018-03-31 ENCOUNTER — Ambulatory Visit (INDEPENDENT_AMBULATORY_CARE_PROVIDER_SITE_OTHER): Payer: BLUE CROSS/BLUE SHIELD | Admitting: Family Medicine

## 2018-03-31 VITALS — BP 136/90 | HR 93 | Temp 98.3°F | Ht 62.0 in | Wt 161.2 lb

## 2018-03-31 DIAGNOSIS — J3489 Other specified disorders of nose and nasal sinuses: Secondary | ICD-10-CM

## 2018-03-31 DIAGNOSIS — W540XXA Bitten by dog, initial encounter: Secondary | ICD-10-CM | POA: Insufficient documentation

## 2018-03-31 DIAGNOSIS — S0125XA Open bite of nose, initial encounter: Secondary | ICD-10-CM | POA: Diagnosis not present

## 2018-03-31 DIAGNOSIS — W540XXD Bitten by dog, subsequent encounter: Secondary | ICD-10-CM

## 2018-03-31 NOTE — Patient Instructions (Signed)
Great to see you. Have a great trip!

## 2018-03-31 NOTE — Assessment & Plan Note (Signed)
Healing well. Wound cx taken.  Has two days of abx left.   Call or return to clinic prn if these symptoms worsen or fail to improve as anticipated. The patient indicates understanding of these issues and agrees with the plan.

## 2018-03-31 NOTE — Progress Notes (Signed)
Subjective:   Patient ID: Sarah Weeks, female    DOB: 1956/04/10, 62 y.o.   MRN: [id number]  Sarah Weeks is a pleasant 62 y.o. year old female who presents to clinic today with Hospitalization Follow-up (Patient is here today for a hospital F/U.  She was bit by her dog when she bent down to kiss it while it was sleeping and startled it awake on 10.01.2019.  She went to [facility] the same day.  She was given a Tdap and Augmentin.  Had a 13mm superficial laceration to forehead and 1cm laceration to left side of nose just anterior to nare.  She returned to [facility] for SR and Augmentin was D/C'ed and she was started on Doxy bid.  She is bothered by a slit that she feels should have been stitched and is worried. )  on 03/31/2018  HPI:  Here for ER follow up.  Went to ER on 03/15/18 after a dog bit her on the nose.  Note reviewed. Was given Tdap and Augmentin- 56mm superficial laceration to forehead and 1 cm laceration to left side of nose. A few loose sutures were placed.  Returned on 03/20/18 to [facility] ER for suture removal. Note reviewed.    Sutures removed- she had some yellow drainage to nose wound so doxycyline was added to Augmentin. The drainage has decreased. Pain and redness resolving.    Current Outpatient Medications on File Prior to Visit  Medication Sig Dispense Refill  . ALPRAZolam (XANAX) 0.5 MG tablet Take 0.5 tablets (0.25 mg total) by mouth 2 (two) times daily as needed for anxiety or sleep. 30 tablet 0  . B Complex Vitamins (B COMPLEX PO) Take by mouth. Reported on 11/27/2015    . diphenhydrAMINE (BENADRYL) 25 mg capsule Take 25 mg every 6 (six) hours as needed by mouth.    . doxycycline (ADOXA) 100 MG tablet Take 100 mg by mouth 2 (two) times daily.    Marland Kitchen loperamide (IMODIUM A-D) 2 MG tablet Take 2 mg 4 (four) times daily as needed by mouth for diarrhea or loose stools.    . metoprolol succinate (TOPROL-XL) 25 MG 24 hr tablet Take 1 tablet (25 mg total) by mouth daily.  30 tablet 3  . Multiple Vitamin (MULTIVITAMIN) tablet Take 2 tablets by mouth daily. Reported on 11/27/2015    . pantoprazole (PROTONIX) 40 MG tablet TAKE 1 TABLET BY MOUTH DAILY 30 tablet 1  . QUEtiapine (SEROQUEL) 25 MG tablet TAKE 1 TABLET(25 MG) BY MOUTH AT BEDTIME 30 tablet 1  . rosuvastatin (CRESTOR) 5 MG tablet Take 1 tablet (5 mg total) by mouth daily. 90 tablet 3  . sertraline (ZOLOFT) 50 MG tablet Take 1.5 tablets (75 mg total) by mouth daily. 45 tablet 3   No current facility-administered medications on file prior to visit.     No Known Allergies  Past Medical History:  Diagnosis Date  . Anxiety   . Arthritis   . Breast cancer ([place]) 2003   LT LUMPECTOMY  . Cancer [facility]) 2002   breast cancer left  . GERD (gastroesophageal reflux disease)   . Hay fever   . Hematuria    gross  . HTN (hypertension) 02/01/2017  . Kidney stones   . Personal history of chemotherapy 2003   BREAST CA  . Personal history of radiation therapy 2003   BREAST CA  . S/P chemotherapy, time since greater than 12 weeks    left 2003  . S/P radiation therapy 2003  left breast cancer  . Shortness of breath dyspnea     Past Surgical History:  Procedure Laterality Date  . ANKLE FUSION  2000  . BREAST EXCISIONAL BIOPSY Left 2003   positive  . BREAST LUMPECTOMY  2002   L side  . CYSTOSCOPY WITH STENT PLACEMENT Right 03/23/2016   Procedure: CYSTOSCOPY WITH STENT PLACEMENT;  Surgeon: Hollice Espy, MD;  Location: [facility] ORS;  Service: Urology;  Laterality: Right;  . URETEROSCOPY WITH HOLMIUM LASER LITHOTRIPSY Right 03/23/2016   Procedure: URETEROSCOPY WITH HOLMIUM LASER LITHOTRIPSY;  Surgeon: Hollice Espy, MD;  Location: [facility] ORS;  Service: Urology;  Laterality: Right;    Family History  Problem Relation Age of Onset  . Cancer Mother   . Breast cancer Mother 67  . Alcohol abuse Father   . Heart disease Father   . Hypertension Father   . Cancer Sister   . Heart disease Sister   . Breast cancer  Sister 73  . Heart disease Brother   . Stroke Maternal Grandfather   . Diabetes Maternal Grandfather   . Kidney cancer Neg Hx   . Kidney disease Neg Hx   . Prostate cancer Neg Hx   . Bladder Cancer Neg Hx     Social History   Socioeconomic History  . Marital status: Divorced    Spouse name: Not on file  . Number of children: Not on file  . Years of education: Not on file  . Highest education level: Not on file  Occupational History  . Not on file  Social Needs  . Financial resource strain: Not on file  . Food insecurity:    Worry: Not on file    Inability: Not on file  . Transportation needs:    Medical: Not on file    Non-medical: Not on file  Tobacco Use  . Smoking status: Former Smoker    Last attempt to quit: 03/12/2013    Years since quitting: 5.0  . Smokeless tobacco: Never Used  Substance and Sexual Activity  . Alcohol use: Yes    Alcohol/week: 14.0 standard drinks    Types: 14 Shots of liquor per week    Comment: occasional  . Drug use: No  . Sexual activity: Never  Lifestyle  . Physical activity:    Days per week: Not on file    Minutes per session: Not on file  . Stress: Not on file  Relationships  . Social connections:    Talks on phone: Not on file    Gets together: Not on file    Attends religious service: Not on file    Active member of club or organization: Not on file    Attends meetings of clubs or organizations: Not on file    Relationship status: Not on file  . Intimate partner violence:    Fear of current or ex partner: Not on file    Emotionally abused: Not on file    Physically abused: Not on file    Forced sexual activity: Not on file  Other Topics Concern  . Not on file  Social History Narrative  . Not on file   The PMH, PSH, Social History, Family History, Medications, and allergies have been reviewed in [facility], and have been updated if relevant.   Review of Systems  Constitutional: Negative.   Respiratory: Negative.     Cardiovascular: Negative.   Gastrointestinal: Negative.   Skin: Positive for wound. Negative for color change, pallor and rash.  Neurological: Negative.  All other systems reviewed and are negative.      Objective:    BP 136/90 (BP Location: Left Arm, Patient Position: Sitting, Cuff Size: Normal)   Pulse 93   Temp 98.3 F (36.8 C) (Oral)   Ht 5\' 2"  (1.575 m)   Wt 161 lb 3.2 oz (73.1 kg)   SpO2 96%   BMI 29.48 kg/m    Physical Exam  Constitutional: She is oriented to person, place, and time. She appears well-developed and well-nourished. No distress.  HENT:  Head: Normocephalic and atraumatic.  Eyes: EOM are normal.  Neck: Normal range of motion.  Cardiovascular: Normal rate.  Pulmonary/Chest: Effort normal.  Musculoskeletal: Normal range of motion.  Neurological: She is alert and oriented to person, place, and time. No cranial nerve deficit.  Skin: She is not diaphoretic.  Psychiatric: She has a normal mood and affect. Her behavior is normal. Judgment and thought content normal.  Small area on her forehead and nose- healing, small area remains open.  Nursing note and vitals reviewed.         Assessment & Plan:   Dog bite, subsequent encounter - Plan: Wound culture  Nose discharge, purulent - Plan: Wound culture No follow-ups on file.

## 2018-04-01 ENCOUNTER — Ambulatory Visit (INDEPENDENT_AMBULATORY_CARE_PROVIDER_SITE_OTHER): Payer: BLUE CROSS/BLUE SHIELD | Admitting: Family Medicine

## 2018-04-01 ENCOUNTER — Encounter: Payer: Self-pay | Admitting: Family Medicine

## 2018-04-01 VITALS — BP 140/76 | HR 96 | Temp 98.1°F | Ht 62.0 in | Wt 161.0 lb

## 2018-04-01 DIAGNOSIS — L568 Other specified acute skin changes due to ultraviolet radiation: Secondary | ICD-10-CM | POA: Diagnosis not present

## 2018-04-01 DIAGNOSIS — T364X5A Adverse effect of tetracyclines, initial encounter: Secondary | ICD-10-CM

## 2018-04-01 NOTE — Progress Notes (Addendum)
Sarah Weeks is a 62 y.o. female here today and complains of a rash on her face, neck, and fingers x 3 days. Chief Complaint  Patient presents with  . Rash    Patient is here today C/O a rash on face, neck, and ear that presented on Tuesday.  She noticed it on Tuesday when the sun was beaming down on her in the car.  It has also spread to forehead and digits 1-3 on both hands.  When hot water touches it she feels like she is on fire.  Rash is red, flat, warm to touch (on neck and head only).  She Tx with Aloe last hs which soothed after a while but it stung while she was applying.   HPI: Pt states 3 days ago she was in her car with sun coming thru the window and she started to feel like her neck was "burning". This sensation spread to the knuckles on both hands as she was driving. By the time she got home, she felt the same sensation on her forehead. 2 days ago the weather was rainy and she states she didn't notice symptoms as much. Yesterday symptoms returned, as did the sunny weather. She tried applying aloe which did soothe her symptoms. She states a hot shower made the affected areas burn. No new lotions, cosmetics, soap, etc.   Of note, the patient recently sustained a dog bit to her face. She was seen in the ER on 10/1 and started on augmentin. She returned on 10/6 to have sutures removed and doxycycline was added to her abx regimen. Pt states she was to stop the augmentin and start the doxy, but ER note states pt was to continue augmentin and add doxy. Regardless, pt completed full course of doxycycline yesterday. Wound is healing, culture was done yesterday by PCP and is in process.   Past Medical History:  Diagnosis Date  . Anxiety   . Arthritis   . Breast cancer ([place]) 2003   LT LUMPECTOMY  . Cancer [facility]) 2002   breast cancer left  . GERD (gastroesophageal reflux disease)   . Hay fever   . Hematuria    gross  . HTN (hypertension) 02/01/2017  . Kidney stones   . Personal history  of chemotherapy 2003   BREAST CA  . Personal history of radiation therapy 2003   BREAST CA  . S/P chemotherapy, time since greater than 12 weeks    left 2003  . S/P radiation therapy 2003   left breast cancer  . Shortness of breath dyspnea    Past Surgical History:  Procedure Laterality Date  . ANKLE FUSION  2000  . BREAST EXCISIONAL BIOPSY Left 2003   positive  . BREAST LUMPECTOMY  2002   L side  . CYSTOSCOPY WITH STENT PLACEMENT Right 03/23/2016   Procedure: CYSTOSCOPY WITH STENT PLACEMENT;  Surgeon: Hollice Espy, MD;  Location: [facility] ORS;  Service: Urology;  Laterality: Right;  . URETEROSCOPY WITH HOLMIUM LASER LITHOTRIPSY Right 03/23/2016   Procedure: URETEROSCOPY WITH HOLMIUM LASER LITHOTRIPSY;  Surgeon: Hollice Espy, MD;  Location: [facility] ORS;  Service: Urology;  Laterality: Right;   Social History   Socioeconomic History  . Marital status: Divorced    Spouse name: Not on file  . Number of children: Not on file  . Years of education: Not on file  . Highest education level: Not on file  Occupational History  . Not on file  Social Needs  . Financial resource strain: Not  on file  . Food insecurity:    Worry: Not on file    Inability: Not on file  . Transportation needs:    Medical: Not on file    Non-medical: Not on file  Tobacco Use  . Smoking status: Former Smoker    Last attempt to quit: 03/12/2013    Years since quitting: 5.0  . Smokeless tobacco: Never Used  Substance and Sexual Activity  . Alcohol use: Yes    Alcohol/week: 14.0 standard drinks    Types: 14 Shots of liquor per week    Comment: occasional  . Drug use: No  . Sexual activity: Never  Lifestyle  . Physical activity:    Days per week: Not on file    Minutes per session: Not on file  . Stress: Not on file  Relationships  . Social connections:    Talks on phone: Not on file    Gets together: Not on file    Attends religious service: Not on file    Active member of club or organization: Not  on file    Attends meetings of clubs or organizations: Not on file    Relationship status: Not on file  . Intimate partner violence:    Fear of current or ex partner: Not on file    Emotionally abused: Not on file    Physically abused: Not on file    Forced sexual activity: Not on file  Other Topics Concern  . Not on file  Social History Narrative  . Not on file   Family History  Problem Relation Age of Onset  . Cancer Mother   . Breast cancer Mother 77  . Alcohol abuse Father   . Heart disease Father   . Hypertension Father   . Cancer Sister   . Heart disease Sister   . Breast cancer Sister 87  . Heart disease Brother   . Stroke Maternal Grandfather   . Diabetes Maternal Grandfather   . Kidney cancer Neg Hx   . Kidney disease Neg Hx   . Prostate cancer Neg Hx   . Bladder Cancer Neg Hx    Immunization History  Administered Date(s) Administered  . Influenza,inj,Quad PF,6+ Mos 05/24/2014, 04/02/2015, 04/01/2016, 04/20/2017, 02/24/2018  . Tdap 05/24/2014, 03/15/2018  . Zoster 05/16/2015   Outpatient Encounter Medications as of 04/01/2018  Medication Sig  . ALPRAZolam (XANAX) 0.5 MG tablet Take 0.5 tablets (0.25 mg total) by mouth 2 (two) times daily as needed for anxiety or sleep.  . B Complex Vitamins (B COMPLEX PO) Take by mouth. Reported on 11/27/2015  . diphenhydrAMINE (BENADRYL) 25 mg capsule Take 25 mg every 6 (six) hours as needed by mouth.  . doxycycline (ADOXA) 100 MG tablet Take 100 mg by mouth 2 (two) times daily.  Marland Kitchen loperamide (IMODIUM A-D) 2 MG tablet Take 2 mg 4 (four) times daily as needed by mouth for diarrhea or loose stools.  . metoprolol succinate (TOPROL-XL) 25 MG 24 hr tablet Take 1 tablet (25 mg total) by mouth daily.  . Multiple Vitamin (MULTIVITAMIN) tablet Take 2 tablets by mouth daily. Reported on 11/27/2015  . pantoprazole (PROTONIX) 40 MG tablet TAKE 1 TABLET BY MOUTH DAILY  . QUEtiapine (SEROQUEL) 25 MG tablet TAKE 1 TABLET(25 MG) BY MOUTH AT  BEDTIME  . rosuvastatin (CRESTOR) 5 MG tablet Take 1 tablet (5 mg total) by mouth daily.  . sertraline (ZOLOFT) 50 MG tablet Take 1.5 tablets (75 mg total) by mouth daily.   No facility-administered  encounter medications on file as of 04/01/2018.    Review of Systems  Constitutional: Negative for chills, fever and malaise/fatigue.  HENT: Negative.   Eyes: Negative.   Respiratory: Negative.   Cardiovascular: Negative.   Gastrointestinal: Negative.   Genitourinary: Negative.   Musculoskeletal: Negative.   Skin: Positive for rash. Negative for itching.  Neurological: Negative.    No Known Allergies  BP 140/76 (BP Location: Right Arm, Patient Position: Sitting, Cuff Size: Normal)   Pulse 96   Temp 98.1 F (36.7 C) (Oral)   Ht 5\' 2"  (1.575 m)   Wt 161 lb (73 kg)   SpO2 98%   BMI 29.45 kg/m   BP Readings from Last 3 Encounters:  04/01/18 140/76  03/31/18 136/90  03/20/18 (!) 160/98   General appearance: alert, cooperative, appears stated age and no distress Neck: no adenopathy and supple, symmetrical, trachea midline Lungs: clear to auscultation bilaterally Heart: regular rate and rhythm and S1, S2 normal Extremities: extremities normal, atraumatic, no cyanosis or edema Skin: diffuse mild erythema on forehead, Lt cheek, ear and neck, dorsum of hand and fingers B/L Neurologic: Grossly normal   A/P: 1. Doxycycline adverse reaction, initial encounter   2. Photosensitivity dermatitis due to sun   - pt with sun sensitivity on sun exposed areas which is very likely d/t doxycycline. Pt completed full course of med yesterday so in about 48 hrs med should be fully metabolized and out of system. Continue supportive care. F/u PRN

## 2018-04-01 NOTE — Patient Instructions (Signed)
Please schedule a CPE with PAP :)

## 2018-04-03 LAB — WOUND CULTURE
MICRO NUMBER:: 91249577
RESULT: NO GROWTH
SPECIMEN QUALITY: ADEQUATE

## 2018-04-05 ENCOUNTER — Telehealth: Payer: Self-pay

## 2018-04-05 NOTE — Telephone Encounter (Signed)
Copied from [place] [phone]. Topic: General - Other >> Apr 05, 2018  2:11 PM Leward Quan A wrote: Reason for CRM: Patient called to find out if there is anything that she can get from Dr Deborra Medina sent to pharmacy to help with rash that she saw Dr Bryan Lemma for on 04/01/18. Patient said that the rash is not going away and she is going to [facility] Friday 04/08/18. Request a call back ASAP please. Ph# [phone]

## 2018-04-05 NOTE — Telephone Encounter (Signed)
The rash is on patient's face, neck and ear and started on Tuesday. She noticed it when the sun was beaming down on her in the car & it has spread to her forehead and digits 1-3 on both hands per the OV note on 10/18. Patient is going out of town to [place] and she is leaving on Friday. Dr. Deborra Medina, please advise.  Thanks!

## 2018-04-05 NOTE — Telephone Encounter (Signed)
Can we please get her in to see Dr. Bryan Lemma tomorrow??

## 2018-04-06 ENCOUNTER — Telehealth: Payer: Self-pay | Admitting: Family Medicine

## 2018-04-06 DIAGNOSIS — L578 Other skin changes due to chronic exposure to nonionizing radiation: Secondary | ICD-10-CM

## 2018-04-06 MED ORDER — TRIAMCINOLONE ACETONIDE 0.5 % EX OINT: 1.0000 "application " | TOPICAL_OINTMENT | Freq: Two times a day (BID) | CUTANEOUS | 0 refills | Status: DC

## 2018-04-06 MED ORDER — PREDNISONE 20 MG PO TABS: ORAL_TABLET | ORAL | 0 refills | Status: DC

## 2018-04-06 NOTE — Telephone Encounter (Signed)
Would you like me to call her and see if she can come in today?

## 2018-04-06 NOTE — Telephone Encounter (Signed)
Spoke with pt and she is unable to come in for appt. Would like to speak with Dr about getting something called in. Dr Bryan Lemma called and spoke with pt.

## 2018-04-06 NOTE — Telephone Encounter (Signed)
Yes that would be great. And if she is not able to come in, I can call and talk with her and possible send in Rx.

## 2018-04-06 NOTE — Telephone Encounter (Signed)
Spoke with pt who states her skin feels "sensitive and sun burnt" in the areas we discussed in the office this past Friday 04/01/18. See OV for full details but pt developed dermatitis in a few sun-exposed areas while taking doxycycline. No new affected areas and nothing is worse, just not improved. She has not avoided the sun and walks dogs twice per day. She has not tried to cover up her forehead with a hat or any other affected areas (neck, dorsum of hands/fingers). Symptoms are better in the shade, indoors, and on rainy days. No blistering, sloughing or peeling of the skin. No fever, chills. No other symptoms. The patient is not able to come in to be seen today and leaves in 2 days for [place].  Rx sent for triamcinolone ointment to use BID to affected areas. I also sent Rx for pt to fill and hold on to for prednisone 20mg  1 tab po daily x 5 days. Pt is not to take this unless topical steroid x 1 week and sun avoidance do not improve symptoms. She expressed understanding. Discussed importance of covering up affected areas when in the sun - hat, lightweight scarf, etc - and staying in shade or avoiding direct sun exposure when possible. Pt expressed understanding. She can also continue topical aloe and other supportive measures. Advised pt to call or seek care in [facility] if symptoms worsen or do not improve with above.

## 2018-04-12 ENCOUNTER — Ambulatory Visit: Payer: Self-pay | Admitting: *Deleted

## 2018-04-12 NOTE — Telephone Encounter (Signed)
Pt called with having both her ankles and feet swollen. She denies that they are red. She can not wear her shoes. She noticed this after coming back from [place]. She flew down there. She denies fever, shortness of breath, itching, or rash. Her ankles usually hurt all the time and she gets an injection in her ankle. So her pain has not increased any. This happened once before but she never saw her provider and it eventually went away. She only wants to see her pcp.  Appointment scheduled per protocol. Reminded to call back with increase in symptoms including shortness of breath, fever, inability to walk or rash. Pt voiced understanding.  Reason for Disposition . Swollen ankle joint  (Exception: area of localized swelling which is itchy)  Answer Assessment - Initial Assessment Questions 1. LOCATION: "Which joint is swollen?"     Ankle joint 2. ONSET: "When did the swelling start?"      Saturday morning 3. SIZE: "How large is the swelling?"     Coming over her ankles 4. PAIN: "Is there any pain?" If so, ask: "How bad is it?" (Scale 1-10; or mild, moderate, severe)     Pain is there from previous problems with her ankles 5. CAUSE: "What do you think caused the swollen joint?"     Not sure 6. OTHER SYMPTOMS: "Do you have any other symptoms?" (e.g., fever, chest pain, difficulty breathing, calf pain)     no  Protocols used: ANKLE SWELLING-A-AH

## 2018-04-13 ENCOUNTER — Ambulatory Visit (INDEPENDENT_AMBULATORY_CARE_PROVIDER_SITE_OTHER): Payer: BLUE CROSS/BLUE SHIELD | Admitting: Family Medicine

## 2018-04-13 ENCOUNTER — Other Ambulatory Visit: Payer: Self-pay | Admitting: Family Medicine

## 2018-04-13 ENCOUNTER — Encounter: Payer: Self-pay | Admitting: Family Medicine

## 2018-04-13 ENCOUNTER — Ambulatory Visit (INDEPENDENT_AMBULATORY_CARE_PROVIDER_SITE_OTHER): Payer: BLUE CROSS/BLUE SHIELD

## 2018-04-13 VITALS — BP 136/86 | HR 107 | Temp 98.4°F | Ht 62.0 in | Wt 165.4 lb

## 2018-04-13 DIAGNOSIS — R911 Solitary pulmonary nodule: Secondary | ICD-10-CM

## 2018-04-13 DIAGNOSIS — R609 Edema, unspecified: Secondary | ICD-10-CM | POA: Insufficient documentation

## 2018-04-13 LAB — CBC
HEMATOCRIT: 35.3 % — AB (ref 36.0–46.0)
Hemoglobin: 11.9 g/dL — ABNORMAL LOW (ref 12.0–15.0)
MCHC: 33.6 g/dL (ref 30.0–36.0)
MCV: 105.4 fl — ABNORMAL HIGH (ref 78.0–100.0)
Platelets: 238 10*3/uL (ref 150.0–400.0)
RBC: 3.35 Mil/uL — AB (ref 3.87–5.11)
RDW: 13.3 % (ref 11.5–15.5)
WBC: 5.4 10*3/uL (ref 4.0–10.5)

## 2018-04-13 LAB — COMPREHENSIVE METABOLIC PANEL
ALBUMIN: 4 g/dL (ref 3.5–5.2)
ALK PHOS: 90 U/L (ref 39–117)
ALT: 25 U/L (ref 0–35)
AST: 35 U/L (ref 0–37)
BUN: 16 mg/dL (ref 6–23)
CO2: 32 mEq/L (ref 19–32)
CREATININE: 0.86 mg/dL (ref 0.40–1.20)
Calcium: 9.5 mg/dL (ref 8.4–10.5)
Chloride: 103 mEq/L (ref 96–112)
GFR: 71.1 mL/min (ref >60.00)
Glucose, Bld: 81 mg/dL (ref 70–99)
POTASSIUM: 4.4 meq/L (ref 3.5–5.1)
SODIUM: 143 meq/L (ref 135–145)
TOTAL PROTEIN: 6.5 g/dL (ref 6.0–8.3)
Total Bilirubin: 0.3 mg/dL (ref 0.2–1.2)

## 2018-04-13 LAB — BRAIN NATRIURETIC PEPTIDE: Pro B Natriuretic peptide (BNP): 145 pg/mL — ABNORMAL HIGH (ref 0.0–100.0)

## 2018-04-13 LAB — TSH: TSH: 1.15 u[IU]/mL (ref 0.35–4.50)

## 2018-04-13 LAB — PROTIME-INR
INR: 0.9 ratio (ref 0.8–1.0)
Prothrombin Time: 10.4 s (ref 9.6–13.1)

## 2018-04-13 LAB — T4, FREE: Free T4: 0.88 ng/dL (ref 0.60–1.60)

## 2018-04-13 NOTE — Addendum Note (Signed)
Addended by: Lynnea Ferrier on: 04/13/2018 07:51 AM   Modules accepted: Orders

## 2018-04-13 NOTE — Progress Notes (Signed)
Subjective:   Patient ID: Sarah Weeks, female    DOB: 1956-04-03, 62 y.o.   MRN: [id number]  Sarah Weeks is a pleasant 62 y.o. year old female who presents to clinic today with Leg Swelling (Patient is here today C/O BLE Edema.  She states that the swelling started on Saturday in the am  denies any increase in Na or decrease in H2O or elevated BP that she has noticed.  She was on a 2 hour flight to [facility] on Friday and flew back yesterday.  )  on 04/13/2018  HPI:  Bilateral LE edema- Started on Saturday morning.  She was on a 2 hour flight to [facility] on Friday and a 2 hour flight back yesterday. No CP or SOB. Swelling is much improved when she wakes up in the morning.  Had similar episode one month ago but had no traveled prior to that episode.  She shows me pictures on her phone today of bilateral pedal edema, appears more severe than her edema today, dated 03/14/09.  Of note, she did start taking Seroquel in 11/2017.   Wt Readings from Last 3 Encounters:  04/13/18 165 lb 6.4 oz (75 kg)  04/01/18 161 lb (73 kg)  03/31/18 161 lb 3.2 oz (73.1 kg)    Current Outpatient Medications on File Prior to Visit  Medication Sig Dispense Refill  . ALPRAZolam (XANAX) 0.5 MG tablet Take 0.5 tablets (0.25 mg total) by mouth 2 (two) times daily as needed for anxiety or sleep. 30 tablet 0  . B Complex Vitamins (B COMPLEX PO) Take by mouth. Reported on 11/27/2015    . diphenhydrAMINE (BENADRYL) 25 mg capsule Take 25 mg every 6 (six) hours as needed by mouth.    . loperamide (IMODIUM A-D) 2 MG tablet Take 2 mg 4 (four) times daily as needed by mouth for diarrhea or loose stools.    . metoprolol succinate (TOPROL-XL) 25 MG 24 hr tablet Take 1 tablet (25 mg total) by mouth daily. 30 tablet 3  . Multiple Vitamin (MULTIVITAMIN) tablet Take 2 tablets by mouth daily. Reported on 11/27/2015    . pantoprazole (PROTONIX) 40 MG tablet TAKE 1 TABLET BY MOUTH DAILY 30 tablet 1  . QUEtiapine (SEROQUEL) 25 MG tablet  TAKE 1 TABLET(25 MG) BY MOUTH AT BEDTIME 30 tablet 1  . rosuvastatin (CRESTOR) 5 MG tablet Take 1 tablet (5 mg total) by mouth daily. 90 tablet 3  . sertraline (ZOLOFT) 50 MG tablet Take 1.5 tablets (75 mg total) by mouth daily. 45 tablet 3  . triamcinolone ointment (KENALOG) 0.5 % Apply 1 application topically 2 (two) times daily. 30 g 0   No current facility-administered medications on file prior to visit.     Allergies  Allergen Reactions  . Doxycycline     Caused major sun sensitivity.    Past Medical History:  Diagnosis Date  . Anxiety   . Arthritis   . Breast cancer ([place]) 2003   LT LUMPECTOMY  . Cancer [facility]) 2002   breast cancer left  . GERD (gastroesophageal reflux disease)   . Hay fever   . Hematuria    gross  . HTN (hypertension) 02/01/2017  . Kidney stones   . Personal history of chemotherapy 2003   BREAST CA  . Personal history of radiation therapy 2003   BREAST CA  . S/P chemotherapy, time since greater than 12 weeks    left 2003  . S/P radiation therapy 2003   left breast cancer  .  Shortness of breath dyspnea     Past Surgical History:  Procedure Laterality Date  . ANKLE FUSION  2000  . BREAST EXCISIONAL BIOPSY Left 2003   positive  . BREAST LUMPECTOMY  2002   L side  . CYSTOSCOPY WITH STENT PLACEMENT Right 03/23/2016   Procedure: CYSTOSCOPY WITH STENT PLACEMENT;  Surgeon: Hollice Espy, MD;  Location: [facility] ORS;  Service: Urology;  Laterality: Right;  . URETEROSCOPY WITH HOLMIUM LASER LITHOTRIPSY Right 03/23/2016   Procedure: URETEROSCOPY WITH HOLMIUM LASER LITHOTRIPSY;  Surgeon: Hollice Espy, MD;  Location: [facility] ORS;  Service: Urology;  Laterality: Right;    Family History  Problem Relation Age of Onset  . Cancer Mother   . Breast cancer Mother 63  . Alcohol abuse Father   . Heart disease Father   . Hypertension Father   . Cancer Sister   . Heart disease Sister   . Breast cancer Sister 41  . Heart disease Brother   . Stroke Maternal  Grandfather   . Diabetes Maternal Grandfather   . Kidney cancer Neg Hx   . Kidney disease Neg Hx   . Prostate cancer Neg Hx   . Bladder Cancer Neg Hx     Social History   Socioeconomic History  . Marital status: Divorced    Spouse name: Not on file  . Number of children: Not on file  . Years of education: Not on file  . Highest education level: Not on file  Occupational History  . Not on file  Social Needs  . Financial resource strain: Not on file  . Food insecurity:    Worry: Not on file    Inability: Not on file  . Transportation needs:    Medical: Not on file    Non-medical: Not on file  Tobacco Use  . Smoking status: Former Smoker    Last attempt to quit: 03/12/2013    Years since quitting: 5.0  . Smokeless tobacco: Never Used  Substance and Sexual Activity  . Alcohol use: Yes    Alcohol/week: 14.0 standard drinks    Types: 14 Shots of liquor per week    Comment: occasional  . Drug use: No  . Sexual activity: Never  Lifestyle  . Physical activity:    Days per week: Not on file    Minutes per session: Not on file  . Stress: Not on file  Relationships  . Social connections:    Talks on phone: Not on file    Gets together: Not on file    Attends religious service: Not on file    Active member of club or organization: Not on file    Attends meetings of clubs or organizations: Not on file    Relationship status: Not on file  . Intimate partner violence:    Fear of current or ex partner: Not on file    Emotionally abused: Not on file    Physically abused: Not on file    Forced sexual activity: Not on file  Other Topics Concern  . Not on file  Social History Narrative  . Not on file   The PMH, PSH, Social History, Family History, Medications, and allergies have been reviewed in [facility], and have been updated if relevant.   Review of Systems  Constitutional: Negative.   HENT: Negative.   Eyes: Negative.   Respiratory: Negative for cough, choking and  shortness of breath.   Cardiovascular: Positive for leg swelling. Negative for chest pain and palpitations.  Gastrointestinal: Negative.  Endocrine: Negative.   Genitourinary: Negative.   Musculoskeletal: Negative.   Allergic/Immunologic: Negative.   Neurological: Negative.   Hematological: Negative.   Psychiatric/Behavioral: Negative.   All other systems reviewed and are negative.      Objective:    BP 136/86 (BP Location: Left Arm, Patient Position: Sitting, Cuff Size: Normal)   Pulse (!) 107   Temp 98.4 F (36.9 C) (Oral)   Ht 5\' 2"  (1.575 m)   Wt 165 lb 6.4 oz (75 kg)   SpO2 99%   BMI 30.25 kg/m    Physical Exam  Constitutional: She is oriented to person, place, and time. She appears well-developed and well-nourished. No distress.  HENT:  Head: Normocephalic and atraumatic.  Eyes: EOM are normal.  Cardiovascular: Normal rate and regular rhythm.  Pulmonary/Chest: Effort normal and breath sounds normal.  Abdominal: Soft. Bowel sounds are normal.  Musculoskeletal: Normal range of motion. She exhibits edema.  1+ pedal edema bilaterally, no redness, warmth, tenderness or cords palpated.  Neurological: She is alert and oriented to person, place, and time. No cranial nerve deficit.  Skin: Skin is warm and dry. She is not diaphoretic.  Psychiatric: She has a normal mood and affect. Her behavior is normal. Judgment and thought content normal.  Nursing note and vitals reviewed.         Assessment & Plan:   Edema, unspecified type No follow-ups on file.

## 2018-04-13 NOTE — Assessment & Plan Note (Signed)
New- discussed potential causes with patient. Unlikely DVT given that she has had this a month ago and that her symptoms are bilateral, no redness warmth or other DVT like symptoms.  More likely multifactorial dependent edema- which can be worsened by several things, flights, increased salt intake, changes in rxs (like seroquel).  Will start work up with CXR and labs today.  Advised to keep legs elevated and to wear compression hose. The patient indicates understanding of these issues and agrees with the plan. Orders Placed This Encounter  Procedures  . DG Chest 2 View  . B Nat Peptide  . CBC  . Comprehensive metabolic panel  . TSH  . T4, free  . Protime-INR

## 2018-04-13 NOTE — Patient Instructions (Addendum)
Great to see you. I will call you with your results from today and you can view them online.   Keep your legs elevated and wear compression hose.  Please schedule a Physical with a Pap :)

## 2018-04-14 ENCOUNTER — Other Ambulatory Visit: Payer: Self-pay | Admitting: Family Medicine

## 2018-04-14 ENCOUNTER — Telehealth: Payer: Self-pay

## 2018-04-14 DIAGNOSIS — R7989 Other specified abnormal findings of blood chemistry: Secondary | ICD-10-CM

## 2018-04-14 NOTE — Telephone Encounter (Signed)
Copied from [place] [id number]. Topic: Referral - Question >> Apr 14, 2018  2:23 PM Judyann Munson wrote: Reason for CRM:  the patient has a referral for CT chest Scan with [facility], She stated her insurance will not cover at this location. She is requesting to have the referral sent to [facility]: [address] [place], [address]. Phone: [phone]. Please advise

## 2018-04-15 NOTE — Telephone Encounter (Signed)
Pt called back to check status of this referral. Requesting a call back.

## 2018-04-18 ENCOUNTER — Ambulatory Visit
Admission: RE | Admit: 2018-04-18 | Discharge: 2018-04-18 | Disposition: A | Discharge status: Home / Self Care | Payer: BLUE CROSS/BLUE SHIELD | Source: Ambulatory Visit | Attending: Family Medicine | Admitting: Family Medicine

## 2018-04-18 DIAGNOSIS — Z87891 Personal history of nicotine dependence: Secondary | ICD-10-CM | POA: Diagnosis not present

## 2018-04-18 DIAGNOSIS — K760 Fatty (change of) liver, not elsewhere classified: Secondary | ICD-10-CM | POA: Diagnosis not present

## 2018-04-18 DIAGNOSIS — R911 Solitary pulmonary nodule: Secondary | ICD-10-CM

## 2018-04-18 DIAGNOSIS — J439 Emphysema, unspecified: Secondary | ICD-10-CM | POA: Insufficient documentation

## 2018-04-18 MED ORDER — IOPAMIDOL (ISOVUE-300) INJECTION 61%
75.0000 mL | Freq: Once | INTRAVENOUS | Status: AC | PRN
  Administered 2018-04-18: 75 mL via INTRAVENOUS

## 2018-04-19 ENCOUNTER — Telehealth: Payer: Self-pay | Admitting: Family Medicine

## 2018-04-19 ENCOUNTER — Other Ambulatory Visit: Payer: Self-pay | Admitting: Family Medicine

## 2018-04-19 DIAGNOSIS — R918 Other nonspecific abnormal finding of lung field: Secondary | ICD-10-CM

## 2018-04-19 NOTE — Telephone Encounter (Signed)
Results discussed with patient and referral placed to [facility].

## 2018-04-19 NOTE — Telephone Encounter (Signed)
I called and spoke to patient, patient was referred to [place] and they will schedule her biopsy per there doctor request. Patient informed that I had already called there office to let them know that it was a urgent referral.  Patient thanked me for calling.

## 2018-04-19 NOTE — Telephone Encounter (Signed)
Dr. Deborra Medina, please see CT result.

## 2018-04-19 NOTE — Telephone Encounter (Signed)
Patient is calling and states she does not want to get her biopsy done in  she wants everything done in [place].

## 2018-04-19 NOTE — Telephone Encounter (Signed)
rec'd Call Report from [facility] with report of abnormal Chest CT on 04/18/18.  Advised that results are available in Epic.  Will notify Flow Coordinator at Dr. Hulen Shouts office.

## 2018-04-19 NOTE — Telephone Encounter (Signed)
Can we make sure this is done in [place]? Thanks!

## 2018-04-26 ENCOUNTER — Encounter: Payer: BLUE CROSS/BLUE SHIELD | Admitting: Cardiothoracic Surgery

## 2018-05-03 ENCOUNTER — Other Ambulatory Visit: Payer: Self-pay

## 2018-05-03 ENCOUNTER — Institutional Professional Consult (permissible substitution) (INDEPENDENT_AMBULATORY_CARE_PROVIDER_SITE_OTHER): Payer: BLUE CROSS/BLUE SHIELD | Admitting: Cardiothoracic Surgery

## 2018-05-03 ENCOUNTER — Other Ambulatory Visit: Payer: Self-pay | Admitting: *Deleted

## 2018-05-03 ENCOUNTER — Encounter: Payer: Self-pay | Admitting: Cardiothoracic Surgery

## 2018-05-03 VITALS — BP 158/80 | HR 96 | Resp 16 | Ht 62.0 in | Wt 165.0 lb

## 2018-05-03 DIAGNOSIS — R918 Other nonspecific abnormal finding of lung field: Secondary | ICD-10-CM | POA: Diagnosis not present

## 2018-05-03 NOTE — Progress Notes (Signed)
PCP is Lucille Passy, MD Referring Provider is Lucille Passy, MD  Chief Complaint  Patient presents with  . Lung Mass    RULobe..Surgical eval, Chest CT 04/18/2018, no PET Scan done/orderd    HPI: Patient examined, CT images of chest personally reviewed and counseled with patient.  62 year old Caucasian female with remote history of breast cancer presents for evaluation of a recently diagnosed 3.5 cm right upper lobe spiculated nodule noted as an incidental finding on chest x-ray when she presented for symptoms of bilateral ankle edema.  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  Her ankle swelling has improved however the chest x-ray was followed by CT scan which demonstrated a probable primary lung cancer.  There is no significant mediastinal adenopathy or large hilar adenopathy.  No evidence of static disease.  She denies tibial tenderness of osteoarthropathy.  Her weight has slightly increased.  She stopped smoking 5 years ago.  Her left breast cancer was resected with lumpectomy and followed with chemoradiation 15 years ago.  She has mild dyspnea on exertion going upstairs and especially in cold weather.  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  She denies headache or other areas of bone pain other than her ankles.  She denies hemoptysis productive cough or chest pain  Family history is strongly positive for heart disease with CABG in father sister and brother.  She has been taking Crestor and metoprolol.  She denies symptoms of angina.  She is never had a stress test.  Last cholesterol 206 with HDL 114.   Past Medical History:  Diagnosis Date  . Anxiety   . Arthritis   . Breast cancer ([place]) 2003   LT LUMPECTOMY  . Cancer [facility]) 2002   breast cancer left  . GERD (gastroesophageal reflux disease)   . Hay fever   . Hematuria    gross  . HTN (hypertension) 02/01/2017   . Kidney stones   . Personal history of chemotherapy 2003   BREAST CA  . Personal history of radiation therapy 2003   BREAST CA  . S/P chemotherapy, time since greater than 12 weeks    left 2003  . S/P radiation therapy 2003   left breast cancer  . Shortness of breath dyspnea     Past Surgical History:  Procedure Laterality Date  . ANKLE FUSION  2000  . BREAST EXCISIONAL BIOPSY Left 2003   positive  . BREAST LUMPECTOMY  2002   L side  . CYSTOSCOPY WITH STENT PLACEMENT Right 03/23/2016   Procedure: CYSTOSCOPY WITH STENT PLACEMENT;  Surgeon: Hollice Espy, MD;  Location: [facility] ORS;  Service: Urology;  Laterality: Right;  . URETEROSCOPY WITH HOLMIUM LASER LITHOTRIPSY Right 03/23/2016   Procedure: URETEROSCOPY WITH HOLMIUM LASER LITHOTRIPSY;  Surgeon: Hollice Espy, MD;  Location: [facility] ORS;  Service: Urology;  Laterality: Right;    Family History  Problem Relation Age of Onset  . Cancer Mother   . Breast cancer Mother 65  . Alcohol abuse Father   . Heart disease Father   . Hypertension Father   . Cancer Sister   . Heart disease Sister   . Breast cancer Sister 26  . Heart disease Brother   . Stroke Maternal Grandfather   . Diabetes Maternal Grandfather   . Kidney cancer Neg Hx   . Kidney disease Neg Hx   .  Prostate cancer Neg Hx   . Bladder Cancer Neg Hx     Social History Social History   Tobacco Use  . Smoking status: Former Smoker    Packs/day: 1.00    Years: 43.00    Pack years: 43.00    Types: Cigarettes    Last attempt to quit: 03/12/2013    Years since quitting: 5.1  . Smokeless tobacco: Never Used  Substance Use Topics  . Alcohol use: Yes    Alcohol/week: 14.0 standard drinks    Types: 14 Shots of liquor per week    Comment: occasional  . Drug use: No    Current Outpatient Medications  Medication Sig Dispense Refill  . ALPRAZolam (XANAX) 0.5 MG tablet TAKE 1/2 TABLET(0.25 MG) BY MOUTH TWICE DAILY AS NEEDED FOR ANXIETY OR SLEEP 30 tablet 0  . B  Complex Vitamins (B COMPLEX PO) Take by mouth. Reported on 11/27/2015    . diphenhydrAMINE (BENADRYL) 25 mg capsule Take 25 mg every 6 (six) hours as needed by mouth.    . metoprolol succinate (TOPROL-XL) 25 MG 24 hr tablet Take 1 tablet (25 mg total) by mouth daily. 30 tablet 3  . Multiple Vitamin (MULTIVITAMIN) tablet Take 2 tablets by mouth daily. Reported on 11/27/2015    . pantoprazole (PROTONIX) 40 MG tablet TAKE 1 TABLET BY MOUTH DAILY 30 tablet 1  . QUEtiapine (SEROQUEL) 25 MG tablet TAKE 1 TABLET(25 MG) BY MOUTH AT BEDTIME 30 tablet 1  . rosuvastatin (CRESTOR) 5 MG tablet Take 1 tablet (5 mg total) by mouth daily. 90 tablet 3  . sertraline (ZOLOFT) 50 MG tablet Take 1.5 tablets (75 mg total) by mouth daily. 45 tablet 3  . loperamide (IMODIUM A-D) 2 MG tablet Take 2 mg 4 (four) times daily as needed by mouth for diarrhea or loose stools.     No current facility-administered medications for this visit.     Allergies  Allergen Reactions  . Doxycycline     Caused major sun sensitivity.    Review of Systems                     Review of Systems :  [ y ] = yes, [  ] = no        General :  Weight gain [  y ]    Weight loss  [   ]  Fatigue Blue.Reese ]  Fever [  ]  Chills  [  ]                                          HEENT    Headache [  ]  Dizziness [  ]  Blurred vision [  ] Glaucoma  [  ]                          Nosebleeds [  ] Painful or loose teeth [  ]        Cardiac :  Chest pain/ pressure [  ]  Resting SOB [  ] exertional SOB [ y ]                        Orthopnea [  ]  Pedal edema  Blue.Reese  ]  Palpitations [  ] Syncope/presyncope [ ]   Paroxysmal nocturnal dyspnea [  ]         Pulmonary : cough [  ]  wheezing [  ]  Hemoptysis [  ] Sputum [  ] Snoring [  ]                              Pneumothorax [  ]  Sleep apnea [  ]        GI : Vomiting [  ]  Dysphagia [  ]  Melena  [  ]  Abdominal pain [ y history of peptic ulcer disease] BRBPR [  ]              Heart  burn [  ]  Constipation [  ] Diarrhea  [  ] Colonoscopy [   ]        GU : Hematuria [  ]  Dysuria [  ]  Nocturia [  ] UTI's [  ] positive history of kidney stone treated with lithotripsy        Vascular : Claudication [  ]  Rest pain [  ]  DVT [  ] Vein stripping [  ] leg ulcers [  ]                          TIA [  ] Stroke [  ]  Varicose veins [  ]        NEURO :  Headaches  [  ] Seizures [  ] Vision changes [  ] Paresthesias [  ]                                               Musculoskeletal :  Arthritis [osteoarthritis of both ankles, severe] Gout  [  ]  Back pain [  ]  Joint pain [  ]        Skin :  Rash [  ]  Melanoma [  ] Sores [  ]        Heme : Bleeding problems [  ]Clotting Disorders [  ] Anemia [  ]Blood Transfusion [ ]         Endocrine : Diabetes [  ] Heat or Cold intolerance [  ] Polyuria [  ]excessive thirst [ ]         Psych : Depression [  ]  Anxiety [y takes Seroquel at night.  Takes Xanax during the day]  Psych hospitalizations [  ] Memory change [  ]                                                                            BP (!) 158/80 (BP Location: Left Arm, Patient Position: Sitting, Cuff Size: Normal)   Pulse 96   Resp 16   Ht 5\' 2"  (1.575 m)   Wt 165 lb (74.8 kg)   SpO2 91% Comment: RA  BMI 30.18 kg/m  Physical Exam       Physical Exam  General: Short overweight middle-aged female anxious but no acute distress HEENT: Normocephalic pupils equal , dentition adequate Neck: Supple without JVD, adenopathy, or bruit Chest: Clear to auscultation, symmetrical breath sounds, no rhonchi, no tenderness             or deformity.  Status post left lumpectomy breast surgery Cardiovascular: Regular rate and rhythm, no murmur, no gallop, peripheral pulses             palpable in all extremities Abdomen:  Soft, nontender, no palpable mass or organomegaly Extremities: Warm, well-perfused, no clubbing cyanosis edema or tenderness,              no venous stasis  changes of the legs Rectal/GU: Deferred Neuro: Grossly non--focal and symmetrical throughout Skin: Clean and dry without rash or ulceration   Diagnostic Tests: CT scan of chest shows a 3.5 cm right upper lobe nodule suspicious for primary bronchogenic carcinoma.  There is no significant mediastinal adenopathy noted on CT  Impression: Patient is a potential candidate for resection/lobectomy.  Prior to surgery she will need to be evaluated PET scan and brain MRI to rule out distant involvement, PFTs to evaluate pulmonary reserve for surgery and Cardiolite stress test to evaluate for cardiac risk during anesthesia.  After these 30s are completed she will return to discuss more about the surgery and to schedule a date.  We discussed doing a biopsy of the mass and I have advised that unless she refuses surgery or surgery is ruled out by the pending studies that the best plan would be to proceed with excisional biopsy and then probable lobectomy.  Plan: She return in about 2 weeks after the above studies are completed.  Len Childs, MD [facility] [phone]

## 2018-05-04 ENCOUNTER — Telehealth: Payer: Self-pay

## 2018-05-04 ENCOUNTER — Other Ambulatory Visit: Payer: Self-pay | Admitting: *Deleted

## 2018-05-04 DIAGNOSIS — R911 Solitary pulmonary nodule: Secondary | ICD-10-CM

## 2018-05-04 NOTE — Telephone Encounter (Signed)
Copied from [place] [phone]. Topic: General - Other >> May 04, 2018 10:53 AM Yvette Rack wrote: Reason for CRM: pt calling to let Dr Deborra Medina know that the test result shows that she has lung cancer please call pt at [phone]

## 2018-05-04 NOTE — Telephone Encounter (Signed)
TA-Pt called to advise you that the test results show that she had Lung Cancer and is asking for you to call her back/it looks like she is scheduled for an MR Brain & PET Scan on 11.29.19 to R/O Metastasis and then with Dr. Prescott Gum with Cardiothoracic on 12.3.19/plz advise/thx dmf

## 2018-05-04 NOTE — Telephone Encounter (Signed)
Returned patient's call.  Offered my support.  She will keep in touch.

## 2018-05-05 ENCOUNTER — Other Ambulatory Visit: Payer: Self-pay | Admitting: Family Medicine

## 2018-05-05 ENCOUNTER — Encounter: Payer: Self-pay | Admitting: Internal Medicine

## 2018-05-05 ENCOUNTER — Ambulatory Visit (INDEPENDENT_AMBULATORY_CARE_PROVIDER_SITE_OTHER): Payer: BLUE CROSS/BLUE SHIELD | Admitting: Internal Medicine

## 2018-05-05 VITALS — BP 150/100 | HR 88 | Ht 62.0 in | Wt 159.0 lb

## 2018-05-05 DIAGNOSIS — I10 Essential (primary) hypertension: Secondary | ICD-10-CM | POA: Diagnosis not present

## 2018-05-05 DIAGNOSIS — R911 Solitary pulmonary nodule: Secondary | ICD-10-CM

## 2018-05-05 DIAGNOSIS — R0609 Other forms of dyspnea: Secondary | ICD-10-CM

## 2018-05-05 DIAGNOSIS — R0600 Dyspnea, unspecified: Secondary | ICD-10-CM

## 2018-05-05 DIAGNOSIS — Z01810 Encounter for preprocedural cardiovascular examination: Secondary | ICD-10-CM

## 2018-05-05 DIAGNOSIS — E785 Hyperlipidemia, unspecified: Secondary | ICD-10-CM

## 2018-05-05 DIAGNOSIS — Z87891 Personal history of nicotine dependence: Secondary | ICD-10-CM

## 2018-05-05 DIAGNOSIS — F329 Major depressive disorder, single episode, unspecified: Secondary | ICD-10-CM

## 2018-05-05 DIAGNOSIS — F32A Depression, unspecified: Secondary | ICD-10-CM

## 2018-05-05 DIAGNOSIS — Z8249 Family history of ischemic heart disease and other diseases of the circulatory system: Secondary | ICD-10-CM

## 2018-05-05 DIAGNOSIS — F419 Anxiety disorder, unspecified: Secondary | ICD-10-CM

## 2018-05-05 NOTE — Patient Instructions (Signed)
Medication Instructions:  Your physician recommends that you continue on your current medications as directed. Please refer to the Current Medication list given to you today.  If you need a refill on your cardiac medications before your next appointment, please call your pharmacy.   Lab work: None ordered If you have labs (blood work) drawn today and your tests are completely normal, you will receive your results only by: Marland Kitchen MyChart Message (if you have MyChart) OR . A paper copy in the mail If you have any lab test that is abnormal or we need to change your treatment, we will call you to review the results.  Testing/Procedures: Your physician has requested that you have an echocardiogram. Echocardiography is a painless test that uses sound waves to create images of your heart. It provides your doctor with information about the size and shape of your heart and how well your heart's chambers and valves are working. This procedure takes approximately one hour. There are no restrictions for this procedure.  Your physician has requested that you have en exercise stress myoview. For further information please visit [email or web address]. Please follow instruction sheet, as given.    Follow-Up: At [facility], you and your health needs are our priority.  As part of our continuing mission to provide you with exceptional heart care, we have created designated Provider Care Teams.  These Care Teams include your primary Cardiologist (physician) and Advanced Practice Providers (APPs -  Physician Assistants and Nurse Practitioners) who all work together to provide you with the care you need, when you need it. You will need a follow up appointment in 3 months.    You may see Dr.Acharya or one of the following Advanced Practice Providers on your designated Care Team:   Rosaria Ferries, PA-C . Jory Sims, DNP, ANP  Any Other Special Instructions Will Be Listed Below (If Applicable).

## 2018-05-05 NOTE — Progress Notes (Addendum)
Cardiology Office Note:    Date:  05/05/2018   ID:  VELECIA OVITT, DOB Jul 17, 1955, MRN [id number]  PCP:  Lucille Passy, MD  Cardiologist:  No primary care provider on file.  Electrophysiologist:  None   Referring MD: Lucille Passy, MD   Preoperative evaluation; lung nodule concerning for primary bronchogenic carcinoma  History of Present Illness:    Sarah Weeks is a 62 y.o. female with a hx of breast cancer, HTN, and 43 pk yr smoking history. She presents today for preoperative evaluation.   She works as a Development worker, community and remains active despite bilateral ankle pain and osteoarthritis. She climbs 19 steps at times during her job and will become significantly short of breath with this activity. She experiences palpitations during periods of intense emotional stress, and when she is more calm or takes alprazolam, these symptoms improve.  She denies chest pain, chest pressure, dyspnea at rest, PND, orthopnea. Denies syncope or presyncope. Denies dizziness or lightheadedness. Endorses snoring but has not be evaluated for sleep apnea.  Drinks one alcoholic beverage daily, more than one on weekends. No recreational drug use or herbal supplements/diet supplements.  Preoperative cardiovascular evaluation Risk factors: former 93 pk/yr smoker, family history of CAD (early MI in father at age 19). Pertinent past cardiac history: None Prior cardiac workup: None History of valve disease: None History of CAD/PAD/CVA/TIA: None History of heart failure: None. Recent bilateral ankle edema in September without associated worsening of shortness of breath or other clinical features of HF.  History of arrhythmia: None On anticoagulation: None Additional history (hypertension, diabetes/on insulin, CKD/current creatinine, OSA, anesthesia complications): HTN. No known OSA, patient scored 0 on Epworth sleepiness scale. No DM2. No known anesthesia complications. No CKD Current symptoms: dyspnea on  exertion. 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 Anxiety   . Arthritis   . Breast cancer ([place]) 2003   LT LUMPECTOMY  . Cancer [facility]) 2002   breast cancer left  . GERD (gastroesophageal reflux disease)   . Hay fever   . Hematuria    gross  . HTN (hypertension) 02/01/2017  . Kidney stones   . Personal history of chemotherapy 2003   BREAST CA  . Personal history of radiation therapy 2003   BREAST CA  . S/P chemotherapy, time since greater than 12 weeks    left 2003  . S/P radiation therapy 2003   left breast cancer  . Shortness of breath dyspnea     Past Surgical History:  Procedure Laterality Date  . ANKLE FUSION  2000  . BREAST EXCISIONAL BIOPSY Left 2003   positive  . BREAST LUMPECTOMY  2002   L side  . CYSTOSCOPY WITH STENT PLACEMENT Right 03/23/2016   Procedure: CYSTOSCOPY WITH STENT PLACEMENT;  Surgeon: Hollice Espy, MD;  Location: [facility] ORS;  Service: Urology;  Laterality: Right;  . URETEROSCOPY WITH HOLMIUM LASER LITHOTRIPSY Right 03/23/2016   Procedure: URETEROSCOPY WITH HOLMIUM LASER LITHOTRIPSY;  Surgeon: Hollice Espy, MD;  Location: [facility] ORS;  Service: Urology;  Laterality: Right;    Current Medications: Current Meds  Medication Sig  . ALPRAZolam (XANAX) 0.5 MG tablet TAKE 1/2 TABLET(0.25 MG) BY MOUTH TWICE DAILY AS NEEDED FOR ANXIETY OR SLEEP  . B Complex Vitamins (B COMPLEX PO) Take by mouth. Reported on 11/27/2015  . diphenhydrAMINE (BENADRYL) 25 mg capsule Take 25 mg every 6 (six) hours as needed  by mouth.  . loperamide (IMODIUM A-D) 2 MG tablet Take 2 mg 4 (four) times daily as needed by mouth for diarrhea or loose stools.  . metoprolol succinate (TOPROL-XL) 25 MG 24 hr tablet Take 1 tablet (25 mg total) by mouth daily.  . Multiple Vitamin (MULTIVITAMIN) tablet Take 2 tablets by mouth daily. Reported on 11/27/2015  . pantoprazole (PROTONIX) 40 MG tablet  TAKE 1 TABLET BY MOUTH DAILY  . QUEtiapine (SEROQUEL) 25 MG tablet TAKE 1 TABLET(25 MG) BY MOUTH AT BEDTIME  . rosuvastatin (CRESTOR) 5 MG tablet Take 1 tablet (5 mg total) by mouth daily.  . sertraline (ZOLOFT) 50 MG tablet Take 1.5 tablets (75 mg total) by mouth daily.     Allergies:   Doxycycline   Social History   Socioeconomic History  . Marital status: Divorced    Spouse name: Not on file  . Number of children: Not on file  . Years of education: Not on file  . Highest education level: Not on file  Occupational History  . Not on file  Social Needs  . Financial resource strain: Not on file  . Food insecurity:    Worry: Not on file    Inability: Not on file  . Transportation needs:    Medical: Not on file    Non-medical: Not on file  Tobacco Use  . Smoking status: Former Smoker    Packs/day: 1.00    Years: 43.00    Pack years: 43.00    Types: Cigarettes    Last attempt to quit: 03/12/2013    Years since quitting: 5.1  . Smokeless tobacco: Never Used  Substance and Sexual Activity  . Alcohol use: Yes    Alcohol/week: 14.0 standard drinks    Types: 14 Shots of liquor per week    Comment: occasional  . Drug use: No  . Sexual activity: Never  Lifestyle  . Physical activity:    Days per week: Not on file    Minutes per session: Not on file  . Stress: Not on file  Relationships  . Social connections:    Talks on phone: Not on file    Gets together: Not on file    Attends religious service: Not on file    Active member of club or organization: Not on file    Attends meetings of clubs or organizations: Not on file    Relationship status: Not on file  Other Topics Concern  . Not on file  Social History Narrative  . Not on file     Family History: The patient's family history includes Alcohol abuse in her father; Breast cancer (age of onset: 50) in her sister; Breast cancer (age of onset: 71) in her mother; Cancer in her mother and sister; Diabetes in her  maternal grandfather; Heart disease in her brother, father, and sister; Hypertension in her father; Stroke in her maternal grandfather. There is no history of Kidney cancer, Kidney disease, Prostate cancer, or Bladder Cancer.  ROS:   Please see the history of present illness.    All other systems reviewed and are negative.  EKGs/Labs/Other Studies Reviewed:    The following studies were reviewed today:  EKG:  EKG is ordered today.  The ekg ordered today demonstrates NSR, ventricular rate 88 bpm.  Recent Labs: 04/13/2018: ALT 25; BUN 16; Creatinine, Ser 0.86; Hemoglobin 11.9; Platelets 238.0; Potassium 4.4; Pro B Natriuretic peptide (BNP) 145.0; Sodium 143; TSH 1.15  Recent Lipid Panel    Component Value Date/Time  CHOL 206 (H) 03/09/2018 1059   TRIG 209.0 (H) 03/09/2018 1059   HDL 91.60 03/09/2018 1059   CHOLHDL 2 03/09/2018 1059   VLDL 41.8 (H) 03/09/2018 1059   LDLCALC 156 (H) 12/28/2017 0943   LDLDIRECT 79.0 03/09/2018 1059    Physical Exam:    VS:  BP (!) 150/100   Pulse 88   Ht 5\' 2"  (1.575 m)   Wt 159 lb (72.1 kg)   BMI 29.08 kg/m     Wt Readings from Last 3 Encounters:  05/05/18 159 lb (72.1 kg)  05/03/18 165 lb (74.8 kg)  04/13/18 165 lb 6.4 oz (75 kg)    Constitutional: No acute distress Eyes: pupils equally round and reactive to light, sclera non-icteric, normal conjunctiva and lids ENMT: normal dentition, moist mucous membranes Cardiovascular: regular rhythm, normal rate, no murmurs. S1 and S2 normal. Radial pulses normal bilaterally. No jugular venous distention.  Respiratory: clear to auscultation bilaterally GI : normal bowel sounds, soft and nontender. No distention.   MSK: extremities warm, well perfused. No edema.  NEURO: grossly nonfocal exam, moves all extremities. PSYCH: alert and oriented x 3, normal mood and affect.  SKIN: warm and dry  ASSESSMENT:    1. Preop cardiovascular exam   2. DOE (dyspnea on exertion)   3. Hyperlipidemia,  unspecified hyperlipidemia type   4. Essential hypertension   5. Anxiety and depression   6. Family history of coronary artery disease   7. Former heavy cigarette smoker (20-39 per day)   8. Lung nodule    PLAN:    She seems able to complete >4 Mets daily. However, her dyspnea on exertion should be evaluated given her family history of CAD and smoking history. We will perform an echocardiogram with history of HTN and dyspnea to better understand diastolic parameters. With a normal exam and normal ECG, I am not suspicious for significant valvular heart disease or LV systolic dysfunction.   Scheduled for treadmill stress nuclear study. She would like to try to exercise on the treadmill, however if peak stress or target heart rate are not achieved, this should be converted to a regadenason nuclear stress study.   She takes metoprolol succinate for hypertension. She does have elevated blood pressure today - I do not plan to uptitrate beta blockade prior to anesthesia, however we can consider addition of alternate antihypertensives after results of testing are available.   We will follow up results to determine fitness for anesthesia. Her appointment with Dr. Prescott Gum is on 05/17/18.   Medication Adjustments/Labs and Tests Ordered: Current medicines are reviewed at length with the patient today.  Concerns regarding medicines are outlined above.  Orders Placed This Encounter  Procedures  . MYOCARDIAL PERFUSION IMAGING  . EKG 12-Lead  . ECHOCARDIOGRAM COMPLETE   No orders of the defined types were placed in this encounter.   Patient Instructions  Medication Instructions:  Your physician recommends that you continue on your current medications as directed. Please refer to the Current Medication list given to you today.  If you need a refill on your cardiac medications before your next appointment, please call your pharmacy.   Lab work: None ordered If you have labs (blood work) drawn  today and your tests are completely normal, you will receive your results only by: Marland Kitchen MyChart Message (if you have MyChart) OR . A paper copy in the mail If you have any lab test that is abnormal or we need to change your treatment, we will call  you to review the results.  Testing/Procedures: Your physician has requested that you have an echocardiogram. Echocardiography is a painless test that uses sound waves to create images of your heart. It provides your doctor with information about the size and shape of your heart and how well your heart's chambers and valves are working. This procedure takes approximately one hour. There are no restrictions for this procedure.  Your physician has requested that you have en exercise stress myoview. For further information please visit [email or web address]. Please follow instruction sheet, as given.    Follow-Up: At [facility], you and your health needs are our priority.  As part of our continuing mission to provide you with exceptional heart care, we have created designated Provider Care Teams.  These Care Teams include your primary Cardiologist (physician) and Advanced Practice Providers (APPs -  Physician Assistants and Nurse Practitioners) who all work together to provide you with the care you need, when you need it. You will need a follow up appointment in 3 months.    You may see Dr.Clara Herbison or one of the following Advanced Practice Providers on your designated Care Team:   Rosaria Ferries, PA-C . Jory Sims, DNP, ANP  Any Other Special Instructions Will Be Listed Below (If Applicable).       Signed, Elouise Munroe, MD  05/05/2018 5:00 PM    [place]

## 2018-05-09 ENCOUNTER — Telehealth (HOSPITAL_COMMUNITY): Payer: Self-pay | Admitting: *Deleted

## 2018-05-09 ENCOUNTER — Ambulatory Visit (HOSPITAL_COMMUNITY)
Admission: RE | Admit: 2018-05-09 | Discharge: 2018-05-09 | Disposition: A | Discharge status: Home / Self Care | Payer: BLUE CROSS/BLUE SHIELD | Source: Ambulatory Visit | Attending: Cardiothoracic Surgery | Admitting: Cardiothoracic Surgery

## 2018-05-09 DIAGNOSIS — R911 Solitary pulmonary nodule: Secondary | ICD-10-CM | POA: Insufficient documentation

## 2018-05-09 LAB — PULMONARY FUNCTION TEST
DL/VA % pred: 85 %
DL/VA: 3.89 ml/min/mmHg/L
DLCO unc % pred: 74 %
DLCO unc: 16.06 ml/min/mmHg
FEF 25-75 Post: 0.84 L/sec
FEF 25-75 Pre: 0.42 L/sec
FEF2575-%Change-Post: 101 %
FEF2575-%Pred-Post: 38 %
FEF2575-%Pred-Pre: 18 %
FEV1-%Change-Post: 28 %
FEV1-%Pred-Post: 59 %
FEV1-%Pred-Pre: 46 %
FEV1-Post: 1.4 L
FEV1-Pre: 1.09 L
FEV1FVC-%Change-Post: 0 %
FEV1FVC-%Pred-Pre: 65 %
FEV6-%Change-Post: 23 %
FEV6-%Pred-Post: 82 %
FEV6-%Pred-Pre: 66 %
FEV6-Post: 2.42 L
FEV6-Pre: 1.95 L
FEV6FVC-%Change-Post: -3 %
FEV6FVC-%Pred-Post: 91 %
FEV6FVC-%Pred-Pre: 94 %
FVC-%Change-Post: 28 %
FVC-%Pred-Post: 90 %
FVC-%Pred-Pre: 70 %
FVC-Post: 2.75 L
FVC-Pre: 2.14 L
Post FEV1/FVC ratio: 51 %
Post FEV6/FVC ratio: 88 %
Pre FEV1/FVC ratio: 51 %
Pre FEV6/FVC Ratio: 91 %
RV % pred: 181 %
RV: 3.46 L
TLC % pred: 118 %
TLC: 5.62 L

## 2018-05-09 MED ORDER — ALBUTEROL SULFATE (2.5 MG/3ML) 0.083% IN NEBU
2.5000 mg | INHALATION_SOLUTION | Freq: Once | RESPIRATORY_TRACT | Status: AC
  Administered 2018-05-09: 2.5 mg via RESPIRATORY_TRACT

## 2018-05-09 NOTE — Telephone Encounter (Signed)
Left message on voicemail in reference to upcoming appointment scheduled for 05/16/18. Phone number given for a call back so details instructions can be given.  Sarah Weeks

## 2018-05-09 NOTE — Telephone Encounter (Signed)
Ok to refill 

## 2018-05-09 NOTE — Telephone Encounter (Signed)
Follow up  ° ° °Patient is returning your call. °

## 2018-05-10 ENCOUNTER — Telehealth (HOSPITAL_COMMUNITY): Payer: Self-pay | Admitting: *Deleted

## 2018-05-10 NOTE — Telephone Encounter (Signed)
Patient given detailed instructions per Myocardial Perfusion Study Information Sheet for the test on 07/06/17. Patient notified to arrive 15 minutes early and that it is imperative to arrive on time for appointment to keep from having the test rescheduled.  If you need to cancel or reschedule your appointment, please call the office within 24 hours of your appointment. . Patient verbalized understanding. Sarah Weeks Jacqueline    

## 2018-05-11 ENCOUNTER — Encounter (HOSPITAL_COMMUNITY): Payer: BLUE CROSS/BLUE SHIELD

## 2018-05-13 ENCOUNTER — Encounter (HOSPITAL_COMMUNITY)
Admission: RE | Admit: 2018-05-13 | Discharge: 2018-05-13 | Disposition: A | Discharge status: Home / Self Care | Payer: BLUE CROSS/BLUE SHIELD | Source: Ambulatory Visit | Attending: Cardiothoracic Surgery | Admitting: Cardiothoracic Surgery

## 2018-05-13 ENCOUNTER — Ambulatory Visit (HOSPITAL_COMMUNITY)
Admission: RE | Admit: 2018-05-13 | Discharge: 2018-05-13 | Disposition: A | Discharge status: Home / Self Care | Payer: BLUE CROSS/BLUE SHIELD | Source: Ambulatory Visit | Attending: Cardiothoracic Surgery | Admitting: Cardiothoracic Surgery

## 2018-05-13 DIAGNOSIS — I700 Atherosclerosis of aorta: Secondary | ICD-10-CM | POA: Insufficient documentation

## 2018-05-13 DIAGNOSIS — J439 Emphysema, unspecified: Secondary | ICD-10-CM | POA: Diagnosis not present

## 2018-05-13 DIAGNOSIS — R911 Solitary pulmonary nodule: Secondary | ICD-10-CM

## 2018-05-13 LAB — GLUCOSE, CAPILLARY: Glucose-Capillary: 82 mg/dL (ref 70–99)

## 2018-05-13 MED ORDER — GADOBUTROL 1 MMOL/ML IV SOLN
7.0000 mL | Freq: Once | INTRAVENOUS | Status: AC | PRN
  Administered 2018-05-13: 7 mL via INTRAVENOUS

## 2018-05-13 MED ORDER — FLUDEOXYGLUCOSE F - 18 (FDG) INJECTION
7.9000 | Freq: Once | INTRAVENOUS | Status: AC | PRN
  Administered 2018-05-13: 7.9 via INTRAVENOUS

## 2018-05-16 ENCOUNTER — Other Ambulatory Visit: Payer: Self-pay

## 2018-05-16 ENCOUNTER — Ambulatory Visit (HOSPITAL_BASED_OUTPATIENT_CLINIC_OR_DEPARTMENT_OTHER): Payer: BLUE CROSS/BLUE SHIELD

## 2018-05-16 ENCOUNTER — Ambulatory Visit (HOSPITAL_COMMUNITY): Payer: BLUE CROSS/BLUE SHIELD | Attending: Cardiovascular Disease

## 2018-05-16 DIAGNOSIS — R7989 Other specified abnormal findings of blood chemistry: Secondary | ICD-10-CM

## 2018-05-16 DIAGNOSIS — R0600 Dyspnea, unspecified: Secondary | ICD-10-CM

## 2018-05-16 DIAGNOSIS — Z01810 Encounter for preprocedural cardiovascular examination: Secondary | ICD-10-CM

## 2018-05-16 DIAGNOSIS — R0609 Other forms of dyspnea: Secondary | ICD-10-CM

## 2018-05-16 LAB — MYOCARDIAL PERFUSION IMAGING
CHL CUP RESTING HR STRESS: 82 {beats}/min
LVDIAVOL: 50 mL (ref 46–106)
LVSYSVOL: 16 mL
NUC STRESS TID: 0.96
Peak HR: 115 {beats}/min
SDS: 1
SRS: 0
SSS: 1

## 2018-05-16 LAB — ECHOCARDIOGRAM COMPLETE
Height: 62 in
WEIGHTICAEL: 2544 [oz_av]

## 2018-05-16 MED ORDER — REGADENOSON 0.4 MG/5ML IV SOLN
0.4000 mg | Freq: Once | INTRAVENOUS | Status: AC
  Administered 2018-05-16: 0.4 mg via INTRAVENOUS

## 2018-05-16 MED ORDER — TECHNETIUM TC 99M TETROFOSMIN IV KIT
10.9000 | PACK | Freq: Once | INTRAVENOUS | Status: AC | PRN
  Administered 2018-05-16: 10.9 via INTRAVENOUS
  Filled 2018-05-16: qty 11

## 2018-05-16 MED ORDER — TECHNETIUM TC 99M TETROFOSMIN IV KIT
31.8000 | PACK | Freq: Once | INTRAVENOUS | Status: AC | PRN
  Administered 2018-05-16: 31.8 via INTRAVENOUS
  Filled 2018-05-16: qty 32

## 2018-05-17 ENCOUNTER — Encounter: Payer: Self-pay | Admitting: Cardiothoracic Surgery

## 2018-05-17 ENCOUNTER — Ambulatory Visit (INDEPENDENT_AMBULATORY_CARE_PROVIDER_SITE_OTHER): Payer: BLUE CROSS/BLUE SHIELD | Admitting: Cardiothoracic Surgery

## 2018-05-17 VITALS — BP 158/98 | HR 92 | Resp 20 | Ht 62.0 in | Wt 159.0 lb

## 2018-05-17 DIAGNOSIS — R918 Other nonspecific abnormal finding of lung field: Secondary | ICD-10-CM

## 2018-05-17 MED ORDER — ALBUTEROL SULFATE HFA 108 (90 BASE) MCG/ACT IN AERS: 2.0000 | INHALATION_SPRAY | Freq: Four times a day (QID) | RESPIRATORY_TRACT | 2 refills | Status: DC | PRN

## 2018-05-17 NOTE — Progress Notes (Signed)
PCP is Lucille Passy, MD Referring Provider is Lucille Passy, MD  Chief Complaint  Patient presents with  . Lung Mass    2 week f/u review PET [place] and MRA Brain 05/13/18, PFT's 05/09/18, ECHO 05/16/18    HPI: Patient returns for scheduled follow-up visit for discussion of recent diagnosed 3 cm right upper lobe mass suspicious for primary bronchogenic carcinoma.  She has a strong history of smoking but stopped 5 years ago.  Her preoperative testing included PET scan which showed intense hypermetabolic activity of the right upper lobe mass without's evidence of distant metastatic disease in the mediastinum liver or adrenal glands or bone.  There is some slight increased metabolic cavity at the right hilum which is nonspecific.  Patient's cardiac evaluation included echocardiogram- I reviewed the images and she has good LV function, no valvular disease.  She underwent a Cardiolite stress test which showed no evidence of ischemia good LV systolic function.  Brain scan showed no evidence of metastatic disease.  PFT showed some significant decrease in mechanics, FEV1 1.0 but this improved to 1.3 with albuterol inhaler.  Diffusion capacity was 64%. I walked the patient in the office 300 feet and her oxygen saturation at rest 95% decreased to 93% post exercise.  The patient has at least moderate-severe COPD.  She understands that lobectomy would most likely have an impact on her exercise tolerance at least initially postop.  If a wedge resection can be performed with good margins along with lymph node dissection then that would be a consideration as well.  Past Medical History:  Diagnosis Date  . Anxiety   . Arthritis   . Breast cancer ([place]) 2003   LT LUMPECTOMY  . Cancer [facility]) 2002   breast cancer left  . GERD (gastroesophageal reflux disease)   . Hay fever   . Hematuria    gross  . HTN (hypertension) 02/01/2017  . Kidney stones   . Personal history of chemotherapy 2003   BREAST CA  . Personal  history of radiation therapy 2003   BREAST CA  . S/P chemotherapy, time since greater than 12 weeks    left 2003  . S/P radiation therapy 2003   left breast cancer  . Shortness of breath dyspnea     Past Surgical History:  Procedure Laterality Date  . ANKLE FUSION  2000  . BREAST EXCISIONAL BIOPSY Left 2003   positive  . BREAST LUMPECTOMY  2002   L side  . CYSTOSCOPY WITH STENT PLACEMENT Right 03/23/2016   Procedure: CYSTOSCOPY WITH STENT PLACEMENT;  Surgeon: Hollice Espy, MD;  Location: [facility] ORS;  Service: Urology;  Laterality: Right;  . URETEROSCOPY WITH HOLMIUM LASER LITHOTRIPSY Right 03/23/2016   Procedure: URETEROSCOPY WITH HOLMIUM LASER LITHOTRIPSY;  Surgeon: Hollice Espy, MD;  Location: [facility] ORS;  Service: Urology;  Laterality: Right;    Family History  Problem Relation Age of Onset  . Cancer Mother   . Breast cancer Mother 45  . Alcohol abuse Father   . Heart disease Father   . Hypertension Father   . Cancer Sister   . Heart disease Sister   . Breast cancer Sister 49  . Heart disease Brother   . Stroke Maternal Grandfather   . Diabetes Maternal Grandfather   . Kidney cancer Neg Hx   . Kidney disease Neg Hx   . Prostate cancer Neg Hx   . Bladder Cancer Neg Hx     Social History Social History   Tobacco Use  .  Smoking status: Former Smoker    Packs/day: 1.00    Years: 43.00    Pack years: 43.00    Types: Cigarettes    Last attempt to quit: 03/12/2013    Years since quitting: 5.1  . Smokeless tobacco: Never Used  Substance Use Topics  . Alcohol use: Yes    Alcohol/week: 14.0 standard drinks    Types: 14 Shots of liquor per week    Comment: occasional  . Drug use: No    Current Outpatient Medications  Medication Sig Dispense Refill  . albuterol (PROVENTIL HFA;VENTOLIN HFA) 108 (90 Base) MCG/ACT inhaler Inhale 2 puffs into the lungs every 6 (six) hours as needed for wheezing or shortness of breath. 1 Inhaler 2  . ALPRAZolam (XANAX) 0.5 MG tablet  TAKE 1/2 TABLET(0.25 MG) BY MOUTH TWICE DAILY AS NEEDED FOR ANXIETY OR SLEEP 30 tablet 0  . B Complex Vitamins (B COMPLEX PO) Take by mouth. Reported on 11/27/2015    . diphenhydrAMINE (BENADRYL) 25 mg capsule Take 25 mg every 6 (six) hours as needed by mouth.    . loperamide (IMODIUM A-D) 2 MG tablet Take 2 mg 4 (four) times daily as needed by mouth for diarrhea or loose stools.    . metoprolol succinate (TOPROL-XL) 25 MG 24 hr tablet Take 1 tablet (25 mg total) by mouth daily. 30 tablet 3  . Multiple Vitamin (MULTIVITAMIN) tablet Take 2 tablets by mouth daily. Reported on 11/27/2015    . pantoprazole (PROTONIX) 40 MG tablet TAKE 1 TABLET BY MOUTH DAILY 30 tablet 1  . QUEtiapine (SEROQUEL) 25 MG tablet TAKE 1 TABLET(25 MG) BY MOUTH AT BEDTIME 30 tablet 1  . rosuvastatin (CRESTOR) 5 MG tablet Take 1 tablet (5 mg total) by mouth daily. 90 tablet 3  . sertraline (ZOLOFT) 50 MG tablet Take 1.5 tablets (75 mg total) by mouth daily. 45 tablet 3   No current facility-administered medications for this visit.     Allergies  Allergen Reactions  . Doxycycline     Caused major sun sensitivity.    Review of Systems   No change since initial consultation  BP (!) 158/98   Pulse 92   Resp 20   Ht 5\' 2"  (1.575 m)   Wt 159 lb (72.1 kg)   SpO2 93% Comment: RA  BMI 29.08 kg/m  Physical Exam Alert and comfortable Breath sounds somewhat distant but clear Heart rate regular Sinus tachycardia with exercise in the hallway walking 300 feet.  Low with O2 saturation maintained.  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  She understands the risks of surgery include bleeding, blood transfusion, postoperative respiratory failure requiring mechanical ventilation, postoperative pneumonia, and death.  She agrees to  proceed with surgery.  Plan: Right VATS-pulmonary section planned at [facility] on Monday, December 9.   Len Childs, MD [facility] [phone]

## 2018-05-18 ENCOUNTER — Telehealth: Payer: Self-pay

## 2018-05-18 ENCOUNTER — Other Ambulatory Visit: Payer: Self-pay | Admitting: *Deleted

## 2018-05-18 DIAGNOSIS — R918 Other nonspecific abnormal finding of lung field: Secondary | ICD-10-CM

## 2018-05-18 NOTE — Telephone Encounter (Signed)
-----   Message from Elouise Munroe, MD sent at 05/17/2018  3:42 PM EST ----- Normal stress test

## 2018-05-18 NOTE — Telephone Encounter (Signed)
Pt aware of myoview results with verbal understanding.

## 2018-05-19 ENCOUNTER — Encounter (HOSPITAL_COMMUNITY): Payer: Self-pay

## 2018-05-19 ENCOUNTER — Encounter (HOSPITAL_COMMUNITY)
Admission: RE | Admit: 2018-05-19 | Discharge: 2018-05-19 | Disposition: A | Discharge status: Home / Self Care | Payer: BLUE CROSS/BLUE SHIELD | Source: Ambulatory Visit | Attending: Cardiothoracic Surgery | Admitting: Cardiothoracic Surgery

## 2018-05-19 ENCOUNTER — Other Ambulatory Visit: Payer: Self-pay

## 2018-05-19 DIAGNOSIS — Z01812 Encounter for preprocedural laboratory examination: Secondary | ICD-10-CM | POA: Diagnosis not present

## 2018-05-19 DIAGNOSIS — R918 Other nonspecific abnormal finding of lung field: Secondary | ICD-10-CM | POA: Insufficient documentation

## 2018-05-19 HISTORY — DX: Personal history of urinary calculi: Z87.442

## 2018-05-19 LAB — COMPREHENSIVE METABOLIC PANEL
ALT: 21 U/L (ref 0–44)
AST: 27 U/L (ref 15–41)
Albumin: 4.1 g/dL (ref 3.5–5.0)
Alkaline Phosphatase: 69 U/L (ref 38–126)
Anion gap: 8 (ref 5–15)
BUN: 14 mg/dL (ref 8–23)
CO2: 28 mmol/L (ref 22–32)
Calcium: 9 mg/dL (ref 8.9–10.3)
Chloride: 104 mmol/L (ref 98–111)
Creatinine, Ser: 0.84 mg/dL (ref 0.44–1.00)
GFR calc Af Amer: >60 mL/min (ref >60)
GFR calc non Af Amer: >60 mL/min (ref >60)
Glucose, Bld: 104 mg/dL — ABNORMAL HIGH (ref 70–99)
Potassium: 3.8 mmol/L (ref 3.5–5.1)
Sodium: 140 mmol/L (ref 135–145)
Total Bilirubin: 0.8 mg/dL (ref 0.3–1.2)
Total Protein: 6.6 g/dL (ref 6.5–8.1)

## 2018-05-19 LAB — SURGICAL PCR SCREEN
MRSA, PCR: NEGATIVE
Staphylococcus aureus: NEGATIVE

## 2018-05-19 LAB — CBC
HCT: 37 % (ref 36.0–46.0)
Hemoglobin: 11.6 g/dL — ABNORMAL LOW (ref 12.0–15.0)
MCH: 33.3 pg (ref 26.0–34.0)
MCHC: 31.4 g/dL (ref 30.0–36.0)
MCV: 106.3 fL — ABNORMAL HIGH (ref 80.0–100.0)
Platelets: 250 10*3/uL (ref 150–400)
RBC: 3.48 MIL/uL — ABNORMAL LOW (ref 3.87–5.11)
RDW: 12.5 % (ref 11.5–15.5)
WBC: 7 10*3/uL (ref 4.0–10.5)
nRBC: 0 % (ref 0.0–0.2)

## 2018-05-19 LAB — URINALYSIS, ROUTINE W REFLEX MICROSCOPIC
Bilirubin Urine: NEGATIVE
Glucose, UA: NEGATIVE mg/dL
Hgb urine dipstick: NEGATIVE
Ketones, ur: NEGATIVE mg/dL
Leukocytes, UA: NEGATIVE
Nitrite: NEGATIVE
Protein, ur: NEGATIVE mg/dL
Specific Gravity, Urine: 1.018 (ref 1.005–1.030)
pH: 5 (ref 5.0–8.0)

## 2018-05-19 LAB — ABO/RH: ABO/RH(D): B NEG

## 2018-05-19 LAB — PROTIME-INR
INR: 1.02
Prothrombin Time: 13.3 seconds (ref 11.4–15.2)

## 2018-05-19 LAB — APTT: aPTT: 28 seconds (ref 24–36)

## 2018-05-19 NOTE — Pre-Procedure Instructions (Signed)
Sarah Weeks  05/19/2018      [id number] Lorina Rabon, [place] [address] [place] [address] Phone: [phone] Fax: [phone]    Your procedure is scheduled on Monday December 9.  Report to [facility] Admitting at 5:30 A.M.  Call this number if you have problems the morning of surgery:  [phone]   Remember:  Do not eat or drink after midnight.    Take these medicines the morning of surgery with A SIP OF WATER:   Metoprolol (toprol XL) Pantoprazole (protonix) Sertraline (zoloft) Albuterol if needed (please bring to hospital with you the day of surgery) Xanax if needed  7 days prior to surgery STOP taking any Aspirin(unless otherwise instructed by your surgeon), Aleve, Naproxen, Ibuprofen, Motrin, Advil, Goody's, BC's, all herbal medications, fish oil, and all vitamins     Do not wear jewelry, make-up or nail polish.  Do not wear lotions, powders, or perfumes, or deodorant.  Do not shave 48 hours prior to surgery.  Men may shave face and neck.  Do not bring valuables to the hospital.  [facility] is not responsible for any belongings or valuables.  Contacts, dentures or bridgework may not be worn into surgery.  Leave your suitcase in the car.  After surgery it may be brought to your room.  For patients admitted to the hospital, discharge time will be determined by your treatment team.  Patients discharged the day of surgery will not be allowed to drive home.   Special instructions:    [facility]- Preparing For Surgery  Before surgery, you can play an important role. Because skin is not sterile, your skin needs to be as free of germs as possible. You can reduce the number of germs on your skin by washing with CHG (chlorahexidine gluconate) Soap before surgery.  CHG is an antiseptic cleaner which kills germs and bonds with the skin to continue  killing germs even after washing.    Oral Hygiene is also important to reduce your risk of infection.  Remember - BRUSH YOUR TEETH THE MORNING OF SURGERY WITH YOUR REGULAR TOOTHPASTE  Please do not use if you have an allergy to CHG or antibacterial soaps. If your skin becomes reddened/irritated stop using the CHG.  Do not shave (including legs and underarms) for at least 48 hours prior to first CHG shower. It is OK to shave your face.  Please follow these instructions carefully.   1. Shower the NIGHT BEFORE SURGERY and the MORNING OF SURGERY with CHG.   2. If you chose to wash your hair, wash your hair first as usual with your normal shampoo.  3. After you shampoo, rinse your hair and body thoroughly to remove the shampoo.  4. Use CHG as you would any other liquid soap. You can apply CHG directly to the skin and wash gently with a scrungie or a clean washcloth.   5. Apply the CHG Soap to your body ONLY FROM THE NECK DOWN.  Do not use on open wounds or open sores. Avoid contact with your eyes, ears, mouth and genitals (private parts). Wash Face and genitals (private parts)  with your normal soap.  6. Wash thoroughly, paying special attention to the area where your surgery will be performed.  7. Thoroughly rinse your body with warm water from the neck down.  8. DO NOT shower/wash with your normal soap  after using and rinsing off the CHG Soap.  9. Pat yourself dry with a CLEAN TOWEL.  10. Wear CLEAN PAJAMAS to bed the night before surgery, wear comfortable clothes the morning of surgery  11. Place CLEAN SHEETS on your bed the night of your first shower and DO NOT SLEEP WITH PETS.    Day of Surgery:  Do not apply any deodorants/lotions.  Please wear clean clothes to the hospital/surgery center.   Remember to brush your teeth WITH YOUR REGULAR TOOTHPASTE.    Please read over the following fact sheets that you were given. Coughing and Deep Breathing, MRSA Information and Surgical  Site Infection Prevention

## 2018-05-19 NOTE — Progress Notes (Signed)
PCP -  Arnette Norris MD Cardiologist - Cherlynn Kaiser MD  Chest x-ray - DOS EKG - 05/05/18 Stress Test - 05/16/18 ECHO - 05/16/18  Blood Thinner Instructions: N/A Aspirin Instructions: N/A  Anesthesia review: none  Patient denies shortness of breath, fever, cough and chest pain at PAT appointment   Patient verbalized understanding of instructions that were given to them at the PAT appointment. Patient was also instructed that they will need to review over the PAT instructions again at home before surgery.

## 2018-05-23 ENCOUNTER — Encounter (HOSPITAL_COMMUNITY)
Admission: RE | Disposition: A | Discharge status: Home / Self Care | Payer: Self-pay | Source: Home / Self Care | Attending: Cardiothoracic Surgery

## 2018-05-23 ENCOUNTER — Inpatient Hospital Stay (HOSPITAL_COMMUNITY): Payer: BLUE CROSS/BLUE SHIELD | Admitting: Certified Registered Nurse Anesthetist

## 2018-05-23 ENCOUNTER — Inpatient Hospital Stay (HOSPITAL_COMMUNITY): Payer: BLUE CROSS/BLUE SHIELD

## 2018-05-23 ENCOUNTER — Inpatient Hospital Stay (HOSPITAL_COMMUNITY)
Admission: RE | Admit: 2018-05-23 | Discharge: 2018-05-31 | DRG: 164 | Disposition: A | Discharge status: Home / Self Care | Payer: BLUE CROSS/BLUE SHIELD | Attending: Cardiothoracic Surgery | Admitting: Cardiothoracic Surgery

## 2018-05-23 DIAGNOSIS — G4700 Insomnia, unspecified: Secondary | ICD-10-CM | POA: Diagnosis present

## 2018-05-23 DIAGNOSIS — Z881 Allergy status to other antibiotic agents status: Secondary | ICD-10-CM | POA: Diagnosis not present

## 2018-05-23 DIAGNOSIS — R000 Tachycardia, unspecified: Secondary | ICD-10-CM | POA: Diagnosis not present

## 2018-05-23 DIAGNOSIS — R222 Localized swelling, mass and lump, trunk: Secondary | ICD-10-CM

## 2018-05-23 DIAGNOSIS — J982 Interstitial emphysema: Secondary | ICD-10-CM | POA: Diagnosis not present

## 2018-05-23 DIAGNOSIS — D62 Acute posthemorrhagic anemia: Secondary | ICD-10-CM | POA: Diagnosis not present

## 2018-05-23 DIAGNOSIS — R918 Other nonspecific abnormal finding of lung field: Secondary | ICD-10-CM | POA: Diagnosis present

## 2018-05-23 DIAGNOSIS — J9382 Other air leak: Secondary | ICD-10-CM | POA: Diagnosis not present

## 2018-05-23 DIAGNOSIS — Z853 Personal history of malignant neoplasm of breast: Secondary | ICD-10-CM

## 2018-05-23 DIAGNOSIS — J449 Chronic obstructive pulmonary disease, unspecified: Secondary | ICD-10-CM | POA: Diagnosis present

## 2018-05-23 DIAGNOSIS — R0602 Shortness of breath: Secondary | ICD-10-CM

## 2018-05-23 DIAGNOSIS — F419 Anxiety disorder, unspecified: Secondary | ICD-10-CM | POA: Diagnosis present

## 2018-05-23 DIAGNOSIS — K219 Gastro-esophageal reflux disease without esophagitis: Secondary | ICD-10-CM | POA: Diagnosis present

## 2018-05-23 DIAGNOSIS — C3411 Malignant neoplasm of upper lobe, right bronchus or lung: Secondary | ICD-10-CM | POA: Diagnosis present

## 2018-05-23 DIAGNOSIS — Z9221 Personal history of antineoplastic chemotherapy: Secondary | ICD-10-CM

## 2018-05-23 DIAGNOSIS — Z923 Personal history of irradiation: Secondary | ICD-10-CM | POA: Diagnosis not present

## 2018-05-23 DIAGNOSIS — Z9689 Presence of other specified functional implants: Secondary | ICD-10-CM

## 2018-05-23 DIAGNOSIS — Z87891 Personal history of nicotine dependence: Secondary | ICD-10-CM

## 2018-05-23 DIAGNOSIS — J939 Pneumothorax, unspecified: Secondary | ICD-10-CM

## 2018-05-23 DIAGNOSIS — Z01818 Encounter for other preprocedural examination: Secondary | ICD-10-CM

## 2018-05-23 DIAGNOSIS — I10 Essential (primary) hypertension: Secondary | ICD-10-CM | POA: Diagnosis present

## 2018-05-23 DIAGNOSIS — Z09 Encounter for follow-up examination after completed treatment for conditions other than malignant neoplasm: Secondary | ICD-10-CM

## 2018-05-23 HISTORY — PX: VIDEO ASSISTED THORACOSCOPY (VATS)/ LOBECTOMY: SHX6169

## 2018-05-23 LAB — BLOOD GAS, ARTERIAL
Acid-Base Excess: 3.1 mmol/L — ABNORMAL HIGH (ref 0.0–2.0)
Bicarbonate: 28.1 mmol/L — ABNORMAL HIGH (ref 20.0–28.0)
Drawn by: 470591
FIO2: 21
O2 Saturation: 99 %
Patient temperature: 98.6
pCO2 arterial: 51.2 mmHg — ABNORMAL HIGH (ref 32.0–48.0)
pH, Arterial: 7.358 (ref 7.350–7.450)
pO2, Arterial: 153 mmHg — ABNORMAL HIGH (ref 83.0–108.0)

## 2018-05-23 LAB — POCT I-STAT 3, ART BLOOD GAS (G3+)
Acid-Base Excess: 2 mmol/L (ref 0.0–2.0)
Acid-Base Excess: 2 mmol/L (ref 0.0–2.0)
Bicarbonate: 29.9 mmol/L — ABNORMAL HIGH (ref 20.0–28.0)
Bicarbonate: 28.4 mmol/L — ABNORMAL HIGH (ref 20.0–28.0)
O2 Saturation: 95 %
O2 Saturation: 97 %
TCO2: 32 mmol/L (ref 22–32)
TCO2: 30 mmol/L (ref 22–32)
pCO2 arterial: 62.3 mmHg — ABNORMAL HIGH (ref 32.0–48.0)
pCO2 arterial: 51.9 mmHg — ABNORMAL HIGH (ref 32.0–48.0)
pH, Arterial: 7.29 — ABNORMAL LOW (ref 7.350–7.450)
pH, Arterial: 7.346 — ABNORMAL LOW (ref 7.350–7.450)
pO2, Arterial: 90 mmHg (ref 83.0–108.0)
pO2, Arterial: 93 mmHg (ref 83.0–108.0)

## 2018-05-23 LAB — PREPARE RBC (CROSSMATCH)

## 2018-05-23 LAB — GLUCOSE, CAPILLARY: Glucose-Capillary: 130 mg/dL — ABNORMAL HIGH (ref 70–99)

## 2018-05-23 SURGERY — VIDEO ASSISTED THORACOSCOPY (VATS)/ LOBECTOMY
Anesthesia: General | Site: Chest | Laterality: Right

## 2018-05-23 MED ORDER — ACETAMINOPHEN 160 MG/5ML PO SOLN: 1000.0000 mg | Freq: Once | ORAL | Status: DC | PRN

## 2018-05-23 MED ORDER — OXYCODONE HCL 5 MG PO TABS
5.0000 mg | ORAL_TABLET | ORAL | Status: DC | PRN
  Administered 2018-05-30: 5 mg via ORAL
  Filled 2018-05-23: qty 1

## 2018-05-23 MED ORDER — FENTANYL 40 MCG/ML IV SOLN
INTRAVENOUS | Status: DC
  Administered 2018-05-23: 135 ug via INTRAVENOUS
  Administered 2018-05-23: 1000 ug via INTRAVENOUS
  Administered 2018-05-23: 135 ug via INTRAVENOUS
  Administered 2018-05-24: 15 ug via INTRAVENOUS
  Administered 2018-05-24: 30 ug via INTRAVENOUS
  Administered 2018-05-24: 45 ug via INTRAVENOUS
  Administered 2018-05-24: 30 ug via INTRAVENOUS
  Administered 2018-05-24: 15 ug via INTRAVENOUS
  Administered 2018-05-24: 45 ug via INTRAVENOUS
  Administered 2018-05-24: 30 ug via INTRAVENOUS
  Administered 2018-05-25: 0 ug via INTRAVENOUS
  Administered 2018-05-25 (×3): 15 ug via INTRAVENOUS
  Administered 2018-05-26 (×3): 0 ug via INTRAVENOUS
  Administered 2018-05-27: 30 ug via INTRAVENOUS
  Administered 2018-05-27: 0 ug via INTRAVENOUS
  Administered 2018-05-27: 30 ug via INTRAVENOUS
  Administered 2018-05-27 (×3): 15 ug via INTRAVENOUS
  Administered 2018-05-28 (×4): 0 ug via INTRAVENOUS
  Administered 2018-05-28: 3 ug via INTRAVENOUS
  Administered 2018-05-29 (×4): 0 ug via INTRAVENOUS
  Filled 2018-05-23: qty 25
  Filled 2018-05-23: qty 1000

## 2018-05-23 MED ORDER — FENTANYL CITRATE (PF) 100 MCG/2ML IJ SOLN
INTRAMUSCULAR | Status: DC | PRN
  Administered 2018-05-23 (×4): 50 ug via INTRAVENOUS

## 2018-05-23 MED ORDER — NALOXONE HCL 0.4 MG/ML IJ SOLN: 0.4000 mg | INTRAMUSCULAR | Status: DC | PRN

## 2018-05-23 MED ORDER — ACETAMINOPHEN 500 MG PO TABS
1000.0000 mg | ORAL_TABLET | Freq: Four times a day (QID) | ORAL | Status: AC
  Administered 2018-05-23 – 2018-05-24 (×5): 1000 mg via ORAL
  Filled 2018-05-23 (×7): qty 2

## 2018-05-23 MED ORDER — FENTANYL CITRATE (PF) 250 MCG/5ML IJ SOLN
INTRAMUSCULAR | Status: AC
  Filled 2018-05-23: qty 5

## 2018-05-23 MED ORDER — LIDOCAINE 2% (20 MG/ML) 5 ML SYRINGE
INTRAMUSCULAR | Status: DC | PRN
  Administered 2018-05-23: 60 mg via INTRAVENOUS

## 2018-05-23 MED ORDER — ONDANSETRON HCL 4 MG/2ML IJ SOLN
4.0000 mg | Freq: Four times a day (QID) | INTRAMUSCULAR | Status: DC | PRN
  Administered 2018-05-27: 4 mg via INTRAVENOUS
  Filled 2018-05-23: qty 2

## 2018-05-23 MED ORDER — PROPOFOL 10 MG/ML IV BOLUS
INTRAVENOUS | Status: DC | PRN
  Administered 2018-05-23: 100 mg via INTRAVENOUS
  Administered 2018-05-23: 50 mg via INTRAVENOUS

## 2018-05-23 MED ORDER — BUPIVACAINE 0.5 % ON-Q PUMP SINGLE CATH 400 ML
400.0000 mL | INJECTION | Status: AC
  Administered 2018-05-23: 400 mL
  Filled 2018-05-23 (×2): qty 400

## 2018-05-23 MED ORDER — BUPIVACAINE HCL (PF) 0.5 % IJ SOLN
INTRAMUSCULAR | Status: AC
  Filled 2018-05-23: qty 10

## 2018-05-23 MED ORDER — ALPRAZOLAM 0.25 MG PO TABS
0.2500 mg | ORAL_TABLET | Freq: Two times a day (BID) | ORAL | Status: DC | PRN
  Administered 2018-05-24 – 2018-05-29 (×7): 0.25 mg via ORAL
  Filled 2018-05-23 (×7): qty 1

## 2018-05-23 MED ORDER — DEXAMETHASONE SODIUM PHOSPHATE 10 MG/ML IJ SOLN
INTRAMUSCULAR | Status: DC | PRN
  Administered 2018-05-23: 10 mg via INTRAVENOUS

## 2018-05-23 MED ORDER — MIDAZOLAM HCL 5 MG/5ML IJ SOLN
INTRAMUSCULAR | Status: DC | PRN
  Administered 2018-05-23: 2 mg via INTRAVENOUS

## 2018-05-23 MED ORDER — ONDANSETRON HCL 4 MG/2ML IJ SOLN: 4.0000 mg | Freq: Four times a day (QID) | INTRAMUSCULAR | Status: DC | PRN

## 2018-05-23 MED ORDER — CEFAZOLIN SODIUM-DEXTROSE 2-4 GM/100ML-% IV SOLN
2.0000 g | Freq: Three times a day (TID) | INTRAVENOUS | Status: AC
  Administered 2018-05-23 – 2018-05-24 (×2): 2 g via INTRAVENOUS
  Filled 2018-05-23 (×2): qty 100

## 2018-05-23 MED ORDER — OXYCODONE HCL 5 MG PO TABS: 5.0000 mg | ORAL_TABLET | Freq: Once | ORAL | Status: DC | PRN

## 2018-05-23 MED ORDER — LACTATED RINGERS IV SOLN
INTRAVENOUS | Status: DC | PRN
  Administered 2018-05-23 (×2): via INTRAVENOUS

## 2018-05-23 MED ORDER — LEVALBUTEROL HCL 1.25 MG/0.5ML IN NEBU
INHALATION_SOLUTION | RESPIRATORY_TRACT | Status: AC
  Administered 2018-05-23: 1.25 mg via RESPIRATORY_TRACT
  Filled 2018-05-23: qty 0.5

## 2018-05-23 MED ORDER — METOPROLOL SUCCINATE ER 25 MG PO TB24
25.0000 mg | ORAL_TABLET | Freq: Every day | ORAL | Status: DC
  Administered 2018-05-24 – 2018-05-31 (×8): 25 mg via ORAL
  Filled 2018-05-23 (×8): qty 1

## 2018-05-23 MED ORDER — DIPHENHYDRAMINE HCL 12.5 MG/5ML PO ELIX
12.5000 mg | ORAL_SOLUTION | Freq: Four times a day (QID) | ORAL | Status: DC | PRN
  Administered 2018-05-24: 12.5 mg via ORAL
  Filled 2018-05-23 (×2): qty 5

## 2018-05-23 MED ORDER — POTASSIUM CHLORIDE 10 MEQ/50ML IV SOLN: 10.0000 meq | Freq: Every day | INTRAVENOUS | Status: DC | PRN

## 2018-05-23 MED ORDER — KETOROLAC TROMETHAMINE 30 MG/ML IJ SOLN
INTRAMUSCULAR | Status: AC
  Filled 2018-05-23: qty 1

## 2018-05-23 MED ORDER — FENTANYL CITRATE (PF) 100 MCG/2ML IJ SOLN
25.0000 ug | INTRAMUSCULAR | Status: DC | PRN
  Administered 2018-05-23: 25 ug via INTRAVENOUS
  Administered 2018-05-23: 50 ug via INTRAVENOUS

## 2018-05-23 MED ORDER — ROCURONIUM BROMIDE 50 MG/5ML IV SOSY
PREFILLED_SYRINGE | INTRAVENOUS | Status: AC
  Filled 2018-05-23: qty 5

## 2018-05-23 MED ORDER — ACETAMINOPHEN 160 MG/5ML PO SOLN: 1000.0000 mg | Freq: Four times a day (QID) | ORAL | Status: AC

## 2018-05-23 MED ORDER — SODIUM CHLORIDE 0.9% FLUSH
9.0000 mL | INTRAVENOUS | Status: DC | PRN
  Administered 2018-05-24 – 2018-05-25 (×2): 9 mL via INTRAVENOUS
  Filled 2018-05-23 (×2): qty 9

## 2018-05-23 MED ORDER — ACETAMINOPHEN 500 MG PO TABS: 1000.0000 mg | ORAL_TABLET | Freq: Once | ORAL | Status: DC | PRN

## 2018-05-23 MED ORDER — SERTRALINE HCL 50 MG PO TABS
50.0000 mg | ORAL_TABLET | Freq: Every day | ORAL | Status: DC
  Administered 2018-05-24 – 2018-05-31 (×8): 50 mg via ORAL
  Filled 2018-05-23 (×8): qty 1

## 2018-05-23 MED ORDER — ACETAMINOPHEN 10 MG/ML IV SOLN: 1000.0000 mg | Freq: Once | INTRAVENOUS | Status: DC | PRN

## 2018-05-23 MED ORDER — LEVALBUTEROL HCL 1.25 MG/0.5ML IN NEBU
1.2500 mg | INHALATION_SOLUTION | Freq: Four times a day (QID) | RESPIRATORY_TRACT | Status: DC
  Administered 2018-05-23 (×2): 1.25 mg via RESPIRATORY_TRACT
  Filled 2018-05-23 (×2): qty 0.5

## 2018-05-23 MED ORDER — BISACODYL 5 MG PO TBEC
10.0000 mg | DELAYED_RELEASE_TABLET | Freq: Every day | ORAL | Status: DC
  Filled 2018-05-23: qty 2

## 2018-05-23 MED ORDER — DEXAMETHASONE SODIUM PHOSPHATE 10 MG/ML IJ SOLN
INTRAMUSCULAR | Status: AC
  Filled 2018-05-23: qty 1

## 2018-05-23 MED ORDER — PHENYLEPHRINE HCL 10 MG/ML IJ SOLN
INTRAMUSCULAR | Status: DC | PRN
  Administered 2018-05-23: 120 ug via INTRAVENOUS
  Administered 2018-05-23: 80 ug via INTRAVENOUS
  Administered 2018-05-23: 120 ug via INTRAVENOUS
  Administered 2018-05-23 (×2): 80 ug via INTRAVENOUS

## 2018-05-23 MED ORDER — PANTOPRAZOLE SODIUM 40 MG PO TBEC
40.0000 mg | DELAYED_RELEASE_TABLET | Freq: Every day | ORAL | Status: DC
  Administered 2018-05-24 – 2018-05-31 (×8): 40 mg via ORAL
  Filled 2018-05-23 (×8): qty 1

## 2018-05-23 MED ORDER — BUPIVACAINE HCL (PF) 0.5 % IJ SOLN
INTRAMUSCULAR | Status: DC | PRN
  Administered 2018-05-23: 10 mL

## 2018-05-23 MED ORDER — FENTANYL CITRATE (PF) 100 MCG/2ML IJ SOLN
INTRAMUSCULAR | Status: AC
  Filled 2018-05-23: qty 2

## 2018-05-23 MED ORDER — TRAMADOL HCL 50 MG PO TABS: 50.0000 mg | ORAL_TABLET | Freq: Four times a day (QID) | ORAL | Status: DC | PRN

## 2018-05-23 MED ORDER — INSULIN ASPART 100 UNIT/ML ~~LOC~~ SOLN
0.0000 [IU] | Freq: Four times a day (QID) | SUBCUTANEOUS | Status: DC
  Administered 2018-05-23 – 2018-05-24 (×2): 2 [IU] via SUBCUTANEOUS
  Administered 2018-05-24: 4 [IU] via SUBCUTANEOUS

## 2018-05-23 MED ORDER — PROPOFOL 10 MG/ML IV BOLUS
INTRAVENOUS | Status: AC
  Filled 2018-05-23: qty 20

## 2018-05-23 MED ORDER — KETOROLAC TROMETHAMINE 30 MG/ML IJ SOLN
INTRAMUSCULAR | Status: DC | PRN
  Administered 2018-05-23: 15 mg via INTRAVENOUS

## 2018-05-23 MED ORDER — LIDOCAINE 2% (20 MG/ML) 5 ML SYRINGE
INTRAMUSCULAR | Status: AC
  Filled 2018-05-23: qty 5

## 2018-05-23 MED ORDER — OXYCODONE HCL 5 MG/5ML PO SOLN: 5.0000 mg | Freq: Once | ORAL | Status: DC | PRN

## 2018-05-23 MED ORDER — ONDANSETRON HCL 4 MG/2ML IJ SOLN
INTRAMUSCULAR | Status: AC
  Filled 2018-05-23: qty 2

## 2018-05-23 MED ORDER — MIDAZOLAM HCL 2 MG/2ML IJ SOLN
INTRAMUSCULAR | Status: AC
  Filled 2018-05-23: qty 2

## 2018-05-23 MED ORDER — 0.9 % SODIUM CHLORIDE (POUR BTL) OPTIME
TOPICAL | Status: DC | PRN
  Administered 2018-05-23: 2000 mL

## 2018-05-23 MED ORDER — GLYCOPYRROLATE PF 0.2 MG/ML IJ SOSY
PREFILLED_SYRINGE | INTRAMUSCULAR | Status: AC
  Filled 2018-05-23: qty 1

## 2018-05-23 MED ORDER — ROCURONIUM BROMIDE 50 MG/5ML IV SOSY
PREFILLED_SYRINGE | INTRAVENOUS | Status: DC | PRN
  Administered 2018-05-23: 30 mg via INTRAVENOUS
  Administered 2018-05-23: 50 mg via INTRAVENOUS

## 2018-05-23 MED ORDER — QUETIAPINE FUMARATE 50 MG PO TABS
25.0000 mg | ORAL_TABLET | Freq: Every day | ORAL | Status: DC
  Administered 2018-05-23 – 2018-05-30 (×7): 25 mg via ORAL
  Filled 2018-05-23 (×7): qty 1

## 2018-05-23 MED ORDER — SENNOSIDES-DOCUSATE SODIUM 8.6-50 MG PO TABS
1.0000 | ORAL_TABLET | Freq: Every day | ORAL | Status: DC
  Administered 2018-05-23 – 2018-05-30 (×2): 1 via ORAL
  Filled 2018-05-23 (×4): qty 1

## 2018-05-23 MED ORDER — CEFAZOLIN SODIUM-DEXTROSE 2-4 GM/100ML-% IV SOLN
2.0000 g | INTRAVENOUS | Status: AC
  Administered 2018-05-23: 2 g via INTRAVENOUS
  Filled 2018-05-23: qty 100

## 2018-05-23 MED ORDER — GLYCOPYRROLATE PF 0.2 MG/ML IJ SOSY
PREFILLED_SYRINGE | INTRAMUSCULAR | Status: DC | PRN
  Administered 2018-05-23: .2 mg via INTRAVENOUS

## 2018-05-23 MED ORDER — ONDANSETRON HCL 4 MG/2ML IJ SOLN
INTRAMUSCULAR | Status: DC | PRN
  Administered 2018-05-23: 4 mg via INTRAVENOUS

## 2018-05-23 MED ORDER — ROSUVASTATIN CALCIUM 5 MG PO TABS
5.0000 mg | ORAL_TABLET | Freq: Every day | ORAL | Status: DC
  Administered 2018-05-24 – 2018-05-31 (×8): 5 mg via ORAL
  Filled 2018-05-23 (×8): qty 1

## 2018-05-23 MED ORDER — POTASSIUM CHLORIDE IN NACL 20-0.9 MEQ/L-% IV SOLN
INTRAVENOUS | Status: DC
  Administered 2018-05-23 – 2018-05-24 (×2): via INTRAVENOUS
  Filled 2018-05-23 (×3): qty 1000

## 2018-05-23 MED ORDER — DIPHENHYDRAMINE HCL 50 MG/ML IJ SOLN: 12.5000 mg | Freq: Four times a day (QID) | INTRAMUSCULAR | Status: DC | PRN

## 2018-05-23 MED ORDER — SUGAMMADEX SODIUM 200 MG/2ML IV SOLN
INTRAVENOUS | Status: DC | PRN
  Administered 2018-05-23: 300 mg via INTRAVENOUS

## 2018-05-23 SURGICAL SUPPLY — 78 items
APPLICATOR TIP EXT COSEAL (VASCULAR PRODUCTS) ×3 IMPLANT
BAG DECANTER FOR FLEXI CONT (MISCELLANEOUS) ×3 IMPLANT
BLADE SURG 11 STRL SS (BLADE) ×3 IMPLANT
CANISTER SUCT 3000ML PPV (MISCELLANEOUS) ×3 IMPLANT
CATH KIT ON Q 5IN SLV (PAIN MANAGEMENT) ×3 IMPLANT
CATH THORACIC 28FR (CATHETERS) ×3 IMPLANT
CONT SPEC 4OZ CLIKSEAL STRL BL (MISCELLANEOUS) ×6 IMPLANT
COVER MAYO STAND STRL (DRAPES) ×3 IMPLANT
COVER WAND RF STERILE (DRAPES) ×3 IMPLANT
CUTTER ECHEON FLEX ENDO 45 340 (ENDOMECHANICALS) ×3 IMPLANT
DERMABOND ADVANCED (GAUZE/BANDAGES/DRESSINGS) ×2
DERMABOND ADVANCED .7 DNX12 (GAUZE/BANDAGES/DRESSINGS) ×1 IMPLANT
DRAPE LAPAROSCOPIC ABDOMINAL (DRAPES) ×3 IMPLANT
DRAPE WARM FLUID 44X44 (DRAPE) ×3 IMPLANT
DRILL BIT 7/64X5 (BIT) ×3 IMPLANT
ELECT BLADE 4.0 EZ CLEAN MEGAD (MISCELLANEOUS) ×3
ELECT REM PT RETURN 9FT ADLT (ELECTROSURGICAL) ×3
ELECTRODE BLDE 4.0 EZ CLN MEGD (MISCELLANEOUS) ×1 IMPLANT
ELECTRODE REM PT RTRN 9FT ADLT (ELECTROSURGICAL) ×1 IMPLANT
GAUZE SPONGE 4X4 12PLY STRL (GAUZE/BANDAGES/DRESSINGS) ×3 IMPLANT
GAUZE SPONGE 4X4 12PLY STRL LF (GAUZE/BANDAGES/DRESSINGS) ×3 IMPLANT
GLOVE BIO SURGEON STRL SZ 6.5 (GLOVE) ×4 IMPLANT
GLOVE BIO SURGEON STRL SZ7.5 (GLOVE) ×6 IMPLANT
GLOVE BIO SURGEONS STRL SZ 6.5 (GLOVE) ×2
GLOVE BIOGEL PI IND STRL 6.5 (GLOVE) ×3 IMPLANT
GLOVE BIOGEL PI INDICATOR 6.5 (GLOVE) ×6
GOWN STRL REUS W/ TWL LRG LVL3 (GOWN DISPOSABLE) ×3 IMPLANT
GOWN STRL REUS W/TWL LRG LVL3 (GOWN DISPOSABLE) ×6
HEMOSTAT SURGICEL 2X14 (HEMOSTASIS) IMPLANT
KIT BASIN OR (CUSTOM PROCEDURE TRAY) ×3 IMPLANT
KIT SUCTION CATH 14FR (SUCTIONS) ×3 IMPLANT
KIT TURNOVER KIT B (KITS) ×3 IMPLANT
NS IRRIG 1000ML POUR BTL (IV SOLUTION) ×12 IMPLANT
PACK CHEST (CUSTOM PROCEDURE TRAY) ×3 IMPLANT
PAD ARMBOARD 7.5X6 YLW CONV (MISCELLANEOUS) ×9 IMPLANT
PASSER SUT SWANSON 36MM LOOP (INSTRUMENTS) ×3 IMPLANT
SEALANT PROGEL (MISCELLANEOUS) IMPLANT
SEALANT SURG COSEAL 8ML (VASCULAR PRODUCTS) ×3 IMPLANT
SOLUTION ANTI FOG 6CC (MISCELLANEOUS) ×3 IMPLANT
SPONGE TONSIL 1.25 RF SGL STRG (GAUZE/BANDAGES/DRESSINGS) ×6 IMPLANT
SPONGE TONSIL TAPE 1 RFD (DISPOSABLE) ×3 IMPLANT
STAPLE RELOAD 45 GRN (STAPLE) ×1 IMPLANT
STAPLE RELOAD 45MM GOLD (STAPLE) ×21 IMPLANT
STAPLE RELOAD 45MM GREEN (STAPLE) ×2
SUT CHROMIC 3 0 SH 27 (SUTURE) IMPLANT
SUT CHROMIC 4 0 SH 27 (SUTURE) ×9 IMPLANT
SUT ETHILON 3 0 PS 1 (SUTURE) IMPLANT
SUT PROLENE 3 0 SH DA (SUTURE) IMPLANT
SUT PROLENE 4 0 RB 1 (SUTURE) ×2
SUT PROLENE 4-0 RB1 .5 CRCL 36 (SUTURE) ×1 IMPLANT
SUT PROLENE 6 0 C 1 30 (SUTURE) IMPLANT
SUT SILK  1 MH (SUTURE) ×2
SUT SILK 0 TIES 10X30 (SUTURE) ×3 IMPLANT
SUT SILK 1 MH (SUTURE) ×1 IMPLANT
SUT SILK 1 TIES 10X30 (SUTURE) IMPLANT
SUT SILK 2 0SH CR/8 30 (SUTURE) IMPLANT
SUT SILK 3 0SH CR/8 30 (SUTURE) IMPLANT
SUT VIC AB 1 CTX 18 (SUTURE) ×6 IMPLANT
SUT VIC AB 1 CTX 27 (SUTURE) ×3 IMPLANT
SUT VIC AB 2 TP1 27 (SUTURE) IMPLANT
SUT VIC AB 2-0 CTX 36 (SUTURE) ×3 IMPLANT
SUT VIC AB 2-0 UR6 27 (SUTURE) ×3 IMPLANT
SUT VIC AB 3-0 SH 18 (SUTURE) IMPLANT
SUT VIC AB 3-0 X1 27 (SUTURE) ×6 IMPLANT
SUT VICRYL 0 UR6 27IN ABS (SUTURE) ×3 IMPLANT
SUT VICRYL 2 TP 1 (SUTURE) ×3 IMPLANT
SWAB COLLECTION DEVICE MRSA (MISCELLANEOUS) IMPLANT
SYR 10ML LL (SYRINGE) ×3 IMPLANT
SYSTEM SAHARA CHEST DRAIN ATS (WOUND CARE) ×3 IMPLANT
TAPE CLOTH SURG 6X10 WHT LF (GAUZE/BANDAGES/DRESSINGS) ×3 IMPLANT
TIP APPLICATOR SPRAY EXTEND 16 (VASCULAR PRODUCTS) IMPLANT
TOWEL GREEN STERILE (TOWEL DISPOSABLE) ×3 IMPLANT
TOWEL GREEN STERILE FF (TOWEL DISPOSABLE) ×3 IMPLANT
TRAP SPECIMEN MUCOUS 40CC (MISCELLANEOUS) IMPLANT
TRAY FOLEY MTR SLVR 16FR STAT (SET/KITS/TRAYS/PACK) ×3 IMPLANT
TROCAR BLADELESS 5MM (ENDOMECHANICALS) ×3 IMPLANT
TUNNELER SHEATH ON-Q 11GX8 DSP (PAIN MANAGEMENT) ×3 IMPLANT
WATER STERILE IRR 1000ML POUR (IV SOLUTION) ×6 IMPLANT

## 2018-05-23 NOTE — Plan of Care (Signed)
  Problem: Education: Goal: Knowledge of disease or condition will improve Outcome: Progressing Goal: Knowledge of the prescribed therapeutic regimen will improve Outcome: Progressing   Problem: Activity: Goal: Risk for activity intolerance will decrease Outcome: Progressing   Problem: Respiratory: Goal: Respiratory status will improve Outcome: Progressing   Problem: Pain Management: Goal: Pain level will decrease Outcome: Progressing

## 2018-05-23 NOTE — Anesthesia Procedure Notes (Signed)
Arterial Line Insertion Start/End12/02/2018 6:50 AM, 05/23/2018 7:00 AM Performed by: Lance Coon, CRNA, CRNA  Patient location: Pre-op. Preanesthetic checklist: patient identified, IV checked, site marked, risks and benefits discussed, surgical consent, monitors and equipment checked, pre-op evaluation, timeout performed and anesthesia consent Lidocaine 1% used for infiltration Left, radial was placed Catheter size: 20 G Hand hygiene performed , maximum sterile barriers used  and Seldinger technique used  Attempts: 2 Procedure performed without using ultrasound guided technique. Following insertion, dressing applied and Biopatch. Post procedure assessment: normal  Patient tolerated the procedure well with no immediate complications.

## 2018-05-23 NOTE — Transfer of Care (Signed)
Immediate Anesthesia Transfer of Care Note  Patient: Sarah KOLASINSKI  Procedure(s) Performed: RIGHT UPPER LOBE VIDEO ASSISTED THORACOSCOPY (VATS) FOR BIOPSY (Right Chest)  Patient Location: PACU  Anesthesia Type:General  Level of Consciousness: awake and patient cooperative  Airway & Oxygen Therapy: Patient Spontanous Breathing and Patient connected to face mask oxygen  Post-op Assessment: Report given to RN and Post -op Vital signs reviewed and stable  Post vital signs: Reviewed and stable  Last Vitals:  Vitals Value Taken Time  BP 144/98 05/23/2018 10:23 AM  Temp    Pulse 75 05/23/2018 10:25 AM  Resp 26 05/23/2018 10:25 AM  SpO2 100 % 05/23/2018 10:25 AM  Vitals shown include unvalidated device data.  Last Pain:  Vitals:   05/23/18 0558  TempSrc: Oral         Complications: No apparent anesthesia complications

## 2018-05-23 NOTE — H&P (Signed)
PCP is Lucille Passy, MD Referring Provider is No ref. provider found  No chief complaint on file.   HPI: Patient examined, CT images of chest personally reviewed and counseled with patient.  62 year old Caucasian female with remote history of breast cancer presents for evaluation of a recently diagnosed 3.5 cm right upper lobe spiculated nodule noted as an incidental finding on chest x-ray when she presented for symptoms of bilateral ankle edema.  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  Her ankle swelling has improved however the chest x-ray was followed by CT scan which demonstrated a probable primary lung cancer.  There is no significant mediastinal adenopathy or large hilar adenopathy.  No evidence of static disease.  She denies tibial tenderness of osteoarthropathy.  Her weight has slightly increased.  She stopped smoking 5 years ago.  Her left breast cancer was resected with lumpectomy and followed with chemoradiation 15 years ago.  She has mild dyspnea on exertion going upstairs and especially in cold weather.  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  She denies headache or other areas of bone pain other than her ankles.  She denies hemoptysis productive cough or chest pain  Family history is strongly positive for heart disease with CABG in father sister and brother.  She has been taking Crestor and metoprolol.  She denies symptoms of angina.  She is never had a stress test.  Last cholesterol 206 with HDL 114.   Past Medical History:  Diagnosis Date  . Anxiety   . Arthritis   . Breast cancer ([place]) 2004   LT LUMPECTOMY  . Cancer [facility]) 2004   breast cancer left  . GERD (gastroesophageal reflux disease)   . Hay fever   . Hematuria    gross  . History of kidney stones   . HTN (hypertension) 02/01/2017  . Personal history of chemotherapy 2003   BREAST CA  .  Personal history of radiation therapy 2003   BREAST CA  . S/P chemotherapy, time since greater than 12 weeks    left 2003  . S/P radiation therapy 2003   left breast cancer  . Shortness of breath dyspnea     Past Surgical History:  Procedure Laterality Date  . ANKLE FUSION Right 2000  . BREAST EXCISIONAL BIOPSY Left 2003   positive  . BREAST LUMPECTOMY  2002   L side  . COLONOSCOPY    . CYSTOSCOPY WITH STENT PLACEMENT Right 03/23/2016   Procedure: CYSTOSCOPY WITH STENT PLACEMENT;  Surgeon: Hollice Espy, MD;  Location: [facility] ORS;  Service: Urology;  Laterality: Right;  . DENTAL SURGERY     implants  . ESOPHAGOGASTRODUODENOSCOPY    . URETEROSCOPY WITH HOLMIUM LASER LITHOTRIPSY Right 03/23/2016   Procedure: URETEROSCOPY WITH HOLMIUM LASER LITHOTRIPSY;  Surgeon: Hollice Espy, MD;  Location: [facility] ORS;  Service: Urology;  Laterality: Right;    Family History  Problem Relation Age of Onset  . Cancer Mother   . Breast cancer Mother 22  . Alcohol abuse Father   . Heart disease Father   . Hypertension Father   . Cancer Sister   . Heart disease Sister   . Breast cancer Sister 43  . Heart disease Brother   . Stroke Maternal Grandfather   . Diabetes Maternal Grandfather   . Kidney cancer Neg Hx   . Kidney disease Neg  Hx   . Prostate cancer Neg Hx   . Bladder Cancer Neg Hx     Social History Social History   Tobacco Use  . Smoking status: Former Smoker    Packs/day: 1.00    Years: 43.00    Pack years: 43.00    Types: Cigarettes    Last attempt to quit: 03/12/2013    Years since quitting: 5.2  . Smokeless tobacco: Never Used  Substance Use Topics  . Alcohol use: Yes    Alcohol/week: 14.0 standard drinks    Types: 14 Shots of liquor per week    Comment: daily drinker  . Drug use: No    Current Facility-Administered Medications  Medication Dose Route Frequency Provider Last Rate Last Dose  . ceFAZolin (ANCEF) IVPB 2g/100 mL premix  2 g Intravenous 30 min Pre-Op Ivin Poot, MD        Allergies  Allergen Reactions  . Doxycycline     Caused major sun sensitivity.    Review of Systems                     Review of Systems :  [ y ] = yes, [  ] = no        General :  Weight gain [  y ]    Weight loss  [   ]  Fatigue Blue.Reese ]  Fever [  ]  Chills  [  ]                                          HEENT    Headache [  ]  Dizziness [  ]  Blurred vision [  ] Glaucoma  [  ]                          Nosebleeds [  ] Painful or loose teeth [  ]        Cardiac :  Chest pain/ pressure [  ]  Resting SOB [  ] exertional SOB [ y ]                        Orthopnea [  ]  Pedal edema  Blue.Reese  ]  Palpitations [  ] Syncope/presyncope [ ]                         Paroxysmal nocturnal dyspnea [  ]         Pulmonary : cough [  ]  wheezing [  ]  Hemoptysis [  ] Sputum [  ] Snoring [  ]                              Pneumothorax [  ]  Sleep apnea [  ]        GI : Vomiting [  ]  Dysphagia [  ]  Melena  [  ]  Abdominal pain [ y history of peptic ulcer disease] BRBPR [  ]              Heart burn [  ]  Constipation [  ] Diarrhea  [  ] Colonoscopy [   ]  GU : Hematuria [  ]  Dysuria [  ]  Nocturia [  ] UTI's [  ] positive history of kidney stone treated with lithotripsy        Vascular : Claudication [  ]  Rest pain [  ]  DVT [  ] Vein stripping [  ] leg ulcers [  ]                          TIA [  ] Stroke [  ]  Varicose veins [  ]        NEURO :  Headaches  [  ] Seizures [  ] Vision changes [  ] Paresthesias [  ]                                               Musculoskeletal :  Arthritis [osteoarthritis of both ankles, severe] Gout  [  ]  Back pain [  ]  Joint pain [  ]        Skin :  Rash [  ]  Melanoma [  ] Sores [  ]        Heme : Bleeding problems [  ]Clotting Disorders [  ] Anemia [  ]Blood Transfusion [ ]         Endocrine : Diabetes [  ] Heat or Cold intolerance [  ] Polyuria [  ]excessive thirst [ ]         Psych : Depression [  ]  Anxiety [y takes Seroquel at  night.  Takes Xanax during the day]  Psych hospitalizations [  ] Memory change [  ]                                                                            BP (!) 141/94   Pulse 73   Temp 97.8 F (36.6 C) (Oral)   Resp 18   SpO2 96%  Physical Exam       Physical Exam  General: Short overweight middle-aged female anxious but no acute distress HEENT: Normocephalic pupils equal , dentition adequate Neck: Supple without JVD, adenopathy, or bruit Chest: Clear to auscultation, symmetrical breath sounds, no rhonchi, no tenderness             or deformity.  Status post left lumpectomy breast surgery Cardiovascular: Regular rate and rhythm, no murmur, no gallop, peripheral pulses             palpable in all extremities Abdomen:  Soft, nontender, no palpable mass or organomegaly Extremities: Warm, well-perfused, no clubbing cyanosis edema or tenderness,              no venous stasis changes of the legs Rectal/GU: Deferred Neuro: Grossly non--focal and symmetrical throughout Skin: Clean and dry without rash or ulceration   Diagnostic Tests: CT scan of chest shows a 3.5 cm right upper lobe nodule suspicious for primary bronchogenic carcinoma.  There is no significant mediastinal adenopathy noted on CT  Impression: Patient is a potential candidate for  resection/lobectomy.  Prior to surgery she will need to be evaluated PET scan and brain MRI to rule out distant involvement, PFTs to evaluate pulmonary reserve for surgery and Cardiolite stress test to evaluate for cardiac risk during anesthesia.  After these 30s are completed she will return to discuss more about the surgery and to schedule a date.  We discussed doing a biopsy of the mass and I have advised that unless she refuses surgery or surgery is ruled out by the pending studies that the best plan would be to proceed with excisional biopsy and then probable lobectomy.  Plan: Cardiac studies show low cardiac risk for VATS Plan  resection RUL mass- benefits and risks of  Bleeding,transfusion, organ failure,stroke, infection,death discussed with patient who agrees to proceed with surgery Len Childs, MD [facility] [phone]

## 2018-05-23 NOTE — Anesthesia Preprocedure Evaluation (Signed)
Anesthesia Evaluation  Patient identified by MRN, date of birth, ID band Patient awake    Reviewed: Allergy & Precautions, NPO status , Patient's Chart, lab work & pertinent test results, reviewed documented beta blocker date and time   History of Anesthesia Complications Negative for: history of anesthetic complications  Airway Mallampati: II  TM Distance: >3 FB Neck ROM: Full    Dental  (+) Dental Advisory Given   Pulmonary shortness of breath, former smoker,    breath sounds clear to auscultation       Cardiovascular hypertension, Pt. on medications and Pt. on home beta blockers  Rhythm:Regular     Neuro/Psych PSYCHIATRIC DISORDERS Anxiety Depression negative neurological ROS     GI/Hepatic Neg liver ROS, PUD, GERD  Medicated and Controlled,  Endo/Other  negative endocrine ROS  Renal/GU negative Renal ROS     Musculoskeletal  (+) Arthritis ,   Abdominal   Peds  Hematology negative hematology ROS (+)   Anesthesia Other Findings   Reproductive/Obstetrics                             Anesthesia Physical Anesthesia Plan  ASA: II  Anesthesia Plan: General   Post-op Pain Management:    Induction: Intravenous  PONV Risk Score and Plan: 3 and Dexamethasone and Ondansetron  Airway Management Planned: Double Lumen EBT  Additional Equipment: Arterial line  Intra-op Plan:   Post-operative Plan: Extubation in OR  Informed Consent: I have reviewed the patients History and Physical, chart, labs and discussed the procedure including the risks, benefits and alternatives for the proposed anesthesia with the patient or authorized representative who has indicated his/her understanding and acceptance.   Dental advisory given  Plan Discussed with: CRNA and Surgeon  Anesthesia Plan Comments:         Anesthesia Quick Evaluation

## 2018-05-23 NOTE — Anesthesia Procedure Notes (Signed)
Procedure Name: Intubation Date/Time: 05/23/2018 7:47 AM Performed by: Lance Coon, CRNA Pre-anesthesia Checklist: Patient identified, Emergency Drugs available, Suction available, Patient being monitored and Timeout performed Patient Re-evaluated:Patient Re-evaluated prior to induction Oxygen Delivery Method: Circle system utilized Preoxygenation: Pre-oxygenation with 100% oxygen Induction Type: IV induction Ventilation: Mask ventilation without difficulty Laryngoscope Size: Mac and 3 Grade View: Grade I Endobronchial tube: Left, Double lumen EBT, EBT position confirmed by fiberoptic bronchoscope and EBT position confirmed by auscultation and 35 Fr Number of attempts: 1 Airway Equipment and Method: Stylet Placement Confirmation: ETT inserted through vocal cords under direct vision,  positive ETCO2 and breath sounds checked- equal and bilateral Tube secured with: Tape Dental Injury: Teeth and Oropharynx as per pre-operative assessment

## 2018-05-23 NOTE — Plan of Care (Signed)
Right neck bruised slightly swollen

## 2018-05-23 NOTE — Progress Notes (Signed)
CT surgery p.m. Rounds    Patient breathing comfortably after right VATS and wedge resection of non-small cell carcinoma with lymph node dissection PCO2 elevated early postop > 53mm Hg and patient placed on BiPAP Now with PCO2 baseline at 52 mmHg and BiPAP has been removed No air leak and minimal drainage from chest tube Sinus rhythm Pain controlled with PCA Breath sounds adequate

## 2018-05-23 NOTE — Op Note (Signed)
NAME: Sarah Weeks, Sarah Weeks MEDICAL RECORD IF:[id number] ACCOUNT [id number] DATE OF BIRTH:11/13/1955 FACILITY: [facility] LOCATION: [facility] PHYSICIAN:Mikka Kissner VAN TRIGT III, MD  OPERATIVE REPORT  DATE OF PROCEDURE:  05/23/2018  OPERATION: 1.  Right VATS (video-assisted thoracoscopic surgery) with wedge resection of 3 cm right upper lobe mass. 2.  Mediastinal lymph node dissection. 3.  Placement of wound On-Q analgesia system.  PREOPERATIVE DIAGNOSES:   1.  Right upper lobe mass, nonsmall cell carcinoma.  Further pathology studies pending. 2.  Severe chronic obstructive pulmonary disease with FEV1 of 1 liter and baseline pCO2 of 52 mmHg.  POSTOPERATIVE DIAGNOSES:   1.  Right upper lobe mass, nonsmall cell carcinoma.  Further pathology studies pending. 2.  Severe chronic obstructive pulmonary disease with FEV1 of 1 liter and baseline pCO2 of 52 mmHg.  SURGEON:  Ivin Poot, MD  ASSISTANT:  Lars Pinks, PA-C.  ANESTHESIA:  General.  CLINICAL NOTE:  The patient is a 62 year old female with heavy smoking history who was found to have a mass in the right upper lung, which showed hypermetabolic activity on PET scan and no mediastinal involvement.  She has a past history of left breast  cancer treated surgically in 2003.  Her preoperative PFTs showed severe COPD, but no evidence of distant metastatic involvement on her PET scan and head CT scan.  I felt that with borderline PFTs and abnormal resting blood gas that a wedge resection with  lymph node dissection would be her best option to avoid severe postoperative pulmonary problems.  I discussed the procedure of VATS and wedge resection versus lobectomy with the patient.  She understood that if the margins could not be adequate, then, a  lobectomy would be required.  She understood the risks of bleeding, blood transfusion, infection, postoperative organ failure, postoperative pulmonary problems including prolonged ventilator dependence,  oxygen dependence, and pleural effusion, prolonged  air leak, and death.  She agreed to proceed with surgery under what I felt was informed consent.  DESCRIPTION OF PROCEDURE:  The patient was brought from preop holding where informed consent was documented and placed in the proper site marked.  The patient was brought to the operating room and placed supine on the operating table where general  anesthesia was induced.  A double lumen endotracheal tube was placed, and the patient was turned right side up.  The right chest was prepped and draped as a sterile field.  A proper time-out was performed.  A small incision was made at the seventh  interspace beneath the tip of the scapula and the camera inserted.  The lung was hyperinflated from emphysema and difficult to discern where the mass was, but there were minimal adhesions.  A small incision was made in the 5th interspace anterior to the scapula and the ribs were gently spread.  Through this small incision, the lung was mobilized and palpated.  The mass in the right upper lobe was palpable.  It was then wedged out with  several staple loads of the Endo GI stapler with good gross margins and negative microscopic margins on subsequent frozen sections.  A lymph node at the right hilum was dissected anterior to the pulmonary artery and submitted for permanent sections.   These staple lines were covered with a fine layer of medical adhesive - CoSeal.  A 28 French chest tube was placed through a separate incision and subsequently connected to a Pleur-Evac drainage system.  The lung was then reexpanded under direct vision.  Pericostal sutures (2) were then  placed to reapproximate the ribs.  The chest incision was closed in layers using interrupted Vicryl, and a running 2-0 Vicryl and running subcuticular.  The site of the  camera was closed in layers using Vicryl.  On-Q catheter was placed between the chest tube site and the main incision, secured to the skin  with a suture and connected to the Marcaine 0.5% reservoir.    The patient was then turned supine, extubated and returned to recovery room in stable condition.  AN/NUANCE  D:05/23/2018 T:05/23/2018 JOB:[id number]

## 2018-05-23 NOTE — Brief Op Note (Addendum)
05/23/2018  10:00 AM  PATIENT:  Sarah Weeks  62 y.o. female  PRE-OPERATIVE DIAGNOSIS:  RIGHT UPPER LOBE LUNG MASS, COPD  POST-OPERATIVE DIAGNOSIS:  Non small cancer RUL, COPD  PROCEDURE:  RIGHT RIGHT UPPER LOBE VIDEO ASSISTED THORACOSCOPY (VATS), RIGHT MINI THORACOTOMY, WEDGE RESECTION of RUL, LYMPH NODE DISSECTION, ON Q PLACEMENT  SURGEON:  Surgeon(s) and Role:    Ivin Poot, MD - Primary  PHYSICIAN ASSISTANT: Lars Pinks PA-C  ANESTHESIA:   general  EBL: 50 ml  BLOOD ADMINISTERED:none  DRAINS: 66 French chest tube placed in the right pleural space   SPECIMEN:  Source of Specimen:  Wedge RUL, lymph nodes  DISPOSITION OF SPECIMEN:  Pathology. Frozen section showed non small cell cancer  Surgical margins are clear- permanent sections of hilar lymph node are pending  COUNTS CORRECT:  YES  DICTATION: .Dragon Dictation  PLAN OF CARE: Admit to inpatient   PATIENT DISPOSITION:  PACU - hemodynamically stable.   Delay start of Pharmacological VTE agent (>24hrs) due to surgical blood loss or risk of bleeding: yes

## 2018-05-23 NOTE — Progress Notes (Signed)
Pre Procedure note for inpatients:   Sarah Weeks has been scheduled for Procedure(s): VIDEO ASSISTED THORACOSCOPY (VATS)/ LOBECTOMY (Right) today. The various methods of treatment have been discussed with the patient. After consideration of the risks, benefits and treatment options the patient has consented to the planned procedure.   The patient has been seen and labs reviewed. There are no changes in the patient's condition to prevent proceeding with the planned procedure today.  Recent labs:  Lab Results  Component Value Date   WBC 7.0 05/19/2018   HGB 11.6 (L) 05/19/2018   HCT 37.0 05/19/2018   PLT 250 05/19/2018   GLUCOSE 104 (H) 05/19/2018   CHOL 206 (H) 03/09/2018   TRIG 209.0 (H) 03/09/2018   HDL 91.60 03/09/2018   LDLDIRECT 79.0 03/09/2018   LDLCALC 156 (H) 12/28/2017   ALT 21 05/19/2018   AST 27 05/19/2018   NA 140 05/19/2018   K 3.8 05/19/2018   CL 104 05/19/2018   CREATININE 0.84 05/19/2018   BUN 14 05/19/2018   CO2 28 05/19/2018   TSH 1.15 04/13/2018   INR 1.02 05/19/2018    Ivin Poot III, MD 05/23/2018 7:04 AM

## 2018-05-23 NOTE — Anesthesia Procedure Notes (Signed)
Central Venous Catheter Insertion Performed by: Lillia Abed, MD, anesthesiologist Start/End12/02/2018 7:05 AM, 05/23/2018 7:20 AM Patient location: Pre-op. Preanesthetic checklist: patient identified, IV checked, risks and benefits discussed, surgical consent, monitors and equipment checked, pre-op evaluation, timeout performed and anesthesia consent Position: Trendelenburg Lidocaine 1% used for infiltration and patient sedated Hand hygiene performed  and maximum sterile barriers used  Catheter size: 8 Fr Total catheter length 16. Central line was placed.Double lumen Procedure performed using ultrasound guided technique. Ultrasound Notes:anatomy identified, needle tip was noted to be adjacent to the nerve/plexus identified, no ultrasound evidence of intravascular and/or intraneural injection and image(s) printed for medical record Attempts: 1 Following insertion, dressing applied, line sutured and Biopatch. Post procedure assessment: blood return through all ports, free fluid flow and no air  Patient tolerated the procedure well with no immediate complications.

## 2018-05-23 NOTE — Progress Notes (Signed)
RN notified of pt's ABG's. Dr. Ermalene Postin and Dr. Nils Pyle made aware. Bipap ordered.

## 2018-05-24 ENCOUNTER — Other Ambulatory Visit: Payer: Self-pay

## 2018-05-24 ENCOUNTER — Inpatient Hospital Stay (HOSPITAL_COMMUNITY): Payer: BLUE CROSS/BLUE SHIELD

## 2018-05-24 ENCOUNTER — Encounter (HOSPITAL_COMMUNITY): Payer: Self-pay

## 2018-05-24 LAB — CBC
HCT: 35 % — ABNORMAL LOW (ref 36.0–46.0)
Hemoglobin: 10.9 g/dL — ABNORMAL LOW (ref 12.0–15.0)
MCH: 33.5 pg (ref 26.0–34.0)
MCHC: 31.1 g/dL (ref 30.0–36.0)
MCV: 107.7 fL — ABNORMAL HIGH (ref 80.0–100.0)
Platelets: 232 10*3/uL (ref 150–400)
RBC: 3.25 MIL/uL — ABNORMAL LOW (ref 3.87–5.11)
RDW: 12.3 % (ref 11.5–15.5)
WBC: 9.7 10*3/uL (ref 4.0–10.5)
nRBC: 0 % (ref 0.0–0.2)

## 2018-05-24 LAB — BLOOD GAS, ARTERIAL
Acid-Base Excess: 1.8 mmol/L (ref 0.0–2.0)
Bicarbonate: 28.9 mmol/L — ABNORMAL HIGH (ref 20.0–28.0)
FIO2: 32
O2 Saturation: 90.3 %
Patient temperature: 98.6
pCO2 arterial: 73.5 mmHg (ref 32.0–48.0)
pH, Arterial: 7.219 — ABNORMAL LOW (ref 7.350–7.450)
pO2, Arterial: 71.7 mmHg — ABNORMAL LOW (ref 83.0–108.0)

## 2018-05-24 LAB — BASIC METABOLIC PANEL
Anion gap: 13 (ref 5–15)
BUN: 13 mg/dL (ref 8–23)
CO2: 25 mmol/L (ref 22–32)
Calcium: 8.4 mg/dL — ABNORMAL LOW (ref 8.9–10.3)
Chloride: 99 mmol/L (ref 98–111)
Creatinine, Ser: 0.91 mg/dL (ref 0.44–1.00)
GFR calc Af Amer: >60 mL/min (ref >60)
GLUCOSE: 126 mg/dL — AB (ref 70–99)
Potassium: 4.5 mmol/L (ref 3.5–5.1)
Sodium: 137 mmol/L (ref 135–145)

## 2018-05-24 LAB — POCT I-STAT 3, ART BLOOD GAS (G3+)
Acid-Base Excess: 3 mmol/L — ABNORMAL HIGH (ref 0.0–2.0)
Bicarbonate: 29 mmol/L — ABNORMAL HIGH (ref 20.0–28.0)
O2 Saturation: 98 %
Patient temperature: 36.5
TCO2: 31 mmol/L (ref 22–32)
pCO2 arterial: 50.4 mmHg — ABNORMAL HIGH (ref 32.0–48.0)
pH, Arterial: 7.366 (ref 7.350–7.450)
pO2, Arterial: 99 mmHg (ref 83.0–108.0)

## 2018-05-24 LAB — GLUCOSE, CAPILLARY
Glucose-Capillary: 116 mg/dL — ABNORMAL HIGH (ref 70–99)
Glucose-Capillary: 141 mg/dL — ABNORMAL HIGH (ref 70–99)
Glucose-Capillary: 175 mg/dL — ABNORMAL HIGH (ref 70–99)

## 2018-05-24 MED ORDER — LEVALBUTEROL HCL 1.25 MG/0.5ML IN NEBU
1.2500 mg | INHALATION_SOLUTION | Freq: Four times a day (QID) | RESPIRATORY_TRACT | Status: DC
  Administered 2018-05-24 – 2018-05-26 (×10): 1.25 mg via RESPIRATORY_TRACT
  Filled 2018-05-24 (×10): qty 0.5

## 2018-05-24 MED ORDER — FUROSEMIDE 10 MG/ML IJ SOLN
20.0000 mg | Freq: Two times a day (BID) | INTRAMUSCULAR | Status: DC
  Administered 2018-05-24 – 2018-05-29 (×10): 20 mg via INTRAVENOUS
  Filled 2018-05-24 (×10): qty 2

## 2018-05-24 MED ORDER — CEFAZOLIN SODIUM-DEXTROSE 2-4 GM/100ML-% IV SOLN
2.0000 g | Freq: Three times a day (TID) | INTRAVENOUS | Status: AC
  Administered 2018-05-24 (×2): 2 g via INTRAVENOUS
  Filled 2018-05-24 (×2): qty 100

## 2018-05-24 MED ORDER — KETOROLAC TROMETHAMINE 15 MG/ML IJ SOLN
15.0000 mg | Freq: Four times a day (QID) | INTRAMUSCULAR | Status: AC
  Administered 2018-05-24 (×4): 15 mg via INTRAVENOUS
  Filled 2018-05-24 (×4): qty 1

## 2018-05-24 MED ORDER — ENOXAPARIN SODIUM 40 MG/0.4ML ~~LOC~~ SOLN
40.0000 mg | SUBCUTANEOUS | Status: DC
  Administered 2018-05-24 – 2018-05-30 (×7): 40 mg via SUBCUTANEOUS
  Filled 2018-05-24 (×7): qty 0.4

## 2018-05-24 NOTE — Discharge Summary (Addendum)
Physician Discharge Summary       [address]       [place] [address]             [phone]    Patient ID: MAFALDA MCGINNISS MRN: [id number] DOB/AGE: [birth year] 62 y.o.  Admit date: 05/23/2018 Discharge date: 05/31/2018  Admission Diagnoses: Mass of upper lobe of right lung  Discharge Diagnoses:  1. Adenocarcinoma RUL 2. S/p right VATS, mini thoracotomy, wedge resection RUL, lymph node dissection, On Q placement 3. ABL anemia 4. History of severe COPD 5. History of left breast cancer-s/p chemo and radiation 6. History of GERD (gastroesophageal reflux disease) 7. History of HTN (hypertension) 8. History of anxiety 9. History of arthritis 10. History of tobacco abuse  Procedure (s):  1.  Right VATS (video-assisted thoracoscopic surgery) with wedge resection of 3 cm right upper lobe mass. 2.  Mediastinal lymph node dissection. 3.  Placement of wound On-Q analgesia system by Dr. Prescott Gum on 05/23/2018.  Pathology:  Lung, wedge biopsy/resection, Right Upper Lobe - INVASIVE ADENOCARCINOMA, POORLY DIFFERENTIATED, SPANNING 2.8 CM. - THE SURGICAL RESECTION MARGINS ARE NEGATIVE FOR CARCINOMA. - SEE ONCOLOGY TABLE BELOW. 2. Lymph node, biopsy, Right Hilar - THERE IS NO EVIDENCE OF CARCINOMA IN 1 OF 1 LYMPH NODE (0/1). TNM CODE: pT1c, pN0  History of Presenting Illness: Patient examined, CT images of chest personally reviewed and counseled with patient.  62 year old Caucasian female with remote history of breast cancer presents for evaluation of a recently diagnosed 3.5 cm right upper lobe spiculated nodule noted as an incidental finding on chest x-ray when she presented for symptoms of bilateral ankle edema.  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  Her ankle swelling has improved however the chest x-ray was followed by CT scan which demonstrated a  probable primary lung cancer.  There is no significant mediastinal adenopathy or large hilar adenopathy.  No evidence of static disease.  She denies tibial tenderness of osteoarthropathy.  Her weight has slightly increased.  She stopped smoking 5 years ago.  Her left breast cancer was resected with lumpectomy and followed with chemoradiation 15 years ago.  She has mild dyspnea on exertion going upstairs and especially in cold weather.  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  She denies headache or other areas of bone pain other than her ankles.  She denies hemoptysis productive cough or chest pain  Family history is strongly positive for heart disease with CABG in father sister and brother.  She has been taking Crestor and metoprolol.  She denies symptoms of angina.  She is never had a stress test.  Last cholesterol 206 with HDL 114.  Patient is a potential candidate for resection/lobectomy.  Prior to surgery she will need to be evaluated PET scan and brain MRI to rule out distant involvement, PFTs to evaluate pulmonary reserve for surgery and Cardiolite stress test to evaluate for cardiac risk during anesthesia.  Nuclear stress test showed LVEF 68%, no T wave inversion and no ischemia. The study was essentially normal. PET scan showed known right upper lobe mass is markedly hypermetabolic,consistent with malignancy. Focal hypermetabolism in the right hilum, suspicious for metastatic spread , no hypermetabolic metastases in the neck, abdomen, or pelvis, emphysema, and aortic atherosclerosis. MRI of the brain was negative for metastatic disease. Finally PFTs, showed pre and post  FEV1/FVC ration 51%, DLCO unc % pred 74%. Dr. Prescott Gum discussed the need for right VATS, mini thoracotomy, and likely wedge resection of the RUL. Potential risks, benefits, and complications of the surgery were discussed with the patient and she agreed to proceed with surgery. She underwent the aforementioned  surgery on 05/23/2018.  Brief Hospital Course:  The patient remained afebrile and hemodynamically stable. A line and foley were removed early in the post operative course. Chest tube output gradually decreased. Daily chest x rays were obtained and remained stable. Chest tube was placed to water seal on 12/10. She did have a small air leak. This later resolved and chest tube was clamped on 12/16. Again, there was no air leak so the chest tube was removed on 05/30/2018. Patient is ambulating on room air. Patient is tolerating a diet and has had a bowel movement. Wounds are clean and dry. Final chest X ray was stable (postoperative changes on the right, tiny residual right pneumothorax, and stable subcutaneous emphysema). As discussed with Dr. Prescott Gum, patient is felt surgically stable for discharge today.   Latest Vital Signs: Blood pressure 128/87, pulse (!) 112, temperature 98.2 F (36.8 C), temperature source Oral, resp. rate 18, height 5\' 2"  (1.575 m), weight 74 kg, SpO2 96 %.  Physical Exam: Cardiovascular: RRR Pulmonary: Mostly clear bilaterally Abdomen: Soft, non tender, bowel sounds present. Extremities:No lower extremity edema. Wounds: Clean and dry.  No erythema or signs of infection. Trace serous drainage from chest tube wound.  Discharge Condition: Stable and discharged to home.  Recent laboratory studies:  Lab Results  Component Value Date   WBC 7.4 05/25/2018   HGB 10.7 (L) 05/25/2018   HCT 33.9 (L) 05/25/2018   MCV 106.9 (H) 05/25/2018   PLT 198 05/25/2018   Lab Results  Component Value Date   NA 135 05/29/2018   K 3.1 (L) 05/29/2018   CL 86 (L) 05/29/2018   CO2 33 (H) 05/29/2018   CREATININE 0.90 05/29/2018   GLUCOSE 131 (H) 05/29/2018    Diagnostic Studies: Dg Chest 2 View  Result Date: 05/31/2018 CLINICAL DATA:  Postop lobectomy.  Chest tube removal EXAM: CHEST - 2 VIEW COMPARISON:  05/30/2018 FINDINGS: Interval removal of right chest tube. Tiny residual  right upper lateral pneumothorax and subcutaneous emphysema. Postoperative changes in the right upper lobe. Left lung clear. Heart is normal size. No effusions or acute bony abnormality. IMPRESSION: Postoperative changes on the right. Tiny residual right pneumothorax following chest tube removal. Stable subcutaneous emphysema. Electronically Signed   By: Rolm Baptise M.D.   On: 05/31/2018 08:33   Dg Chest 2 View  Result Date: 05/30/2018 CLINICAL DATA:  Status post right-sided lobectomy with chest tube in place EXAM: CHEST - 2 VIEW COMPARISON:  May 29, 2018 FINDINGS: Chest tube remains on the right. There is a minimal right apical pneumothorax. No tension component. There is subcutaneous air throughout the chest, primarily on the right, stable. There is patchy infiltrate with postoperative change and volume loss in the right upper lobe. The lungs elsewhere are clear. The heart size and pulmonary vascularity are normal. No adenopathy. No bone lesions. IMPRESSION: Postoperative change on the right with atelectasis and patchy infiltrate right upper lobe. There may be a degree of contusion in this area. There is a minimal pneumothorax on the right with chest tube unchanged in position. Subcutaneous air noted, primarily on the right. Left lung clear. Stable cardiac silhouette. Electronically Signed   By: Lowella Grip III M.D.  On: 05/30/2018 07:04   Dg Chest 2 View  Result Date: 05/23/2018 CLINICAL DATA:  Preoperative examination prior right lobectomy. EXAM: CHEST - 2 VIEW COMPARISON:  Chest x-ray of April 13, 2018 FINDINGS: The right upper lobe mass is again demonstrated and not greatly changed from the previous study. The remainder of the right lung is clear as is the left lung. The heart and pulmonary vascularity are normal. The mediastinum is normal in width. The bony thorax exhibits no acute abnormality. There's mild multilevel degenerative disc disease of the thoracic spine. There's  calcification in the wall of the aortic arch. IMPRESSION: There's no acute cardiopulmonary abnormality. There's a known right upper lobe mass. Thoracic aortic atherosclerosis. Electronically Signed   By: David  Martinique M.D.   On: 05/23/2018 07:00   Mr Jeri Cos WE Contrast  Result Date: 05/13/2018 CLINICAL DATA:  History of breast cancer.  Solitary pulmonary nodule EXAM: MRI HEAD WITHOUT AND WITH CONTRAST TECHNIQUE: Multiplanar, multiecho pulse sequences of the brain and surrounding structures were obtained without and with intravenous contrast. CONTRAST:  7 mL Gadovist IV COMPARISON:  None. FINDINGS: Brain: No acute infarction, hemorrhage, hydrocephalus, extra-axial collection or mass lesion. Small white matter hyperintensities bilaterally do not enhance. Normal enhancement postcontrast administration. Negative for metastatic disease. Vascular: Normal arterial flow voids Skull and upper cervical spine: Negative Sinuses/Orbits: Negative Other: None IMPRESSION: No acute abnormality and negative for metastatic disease. Electronically Signed   By: Franchot Gallo M.D.   On: 05/13/2018 17:19   Nm Pet Image Initial (pi) Skull Base To Thigh  Result Date: 05/13/2018 CLINICAL DATA:  Initial treatment strategy for lung nodule. EXAM: NUCLEAR MEDICINE PET SKULL BASE TO THIGH TECHNIQUE: 7.9 mCi F-18 FDG was injected intravenously. Full-ring PET imaging was performed from the skull base to thigh after the radiotracer. CT data was obtained and used for attenuation correction and anatomic localization. Fasting blood glucose: 82 mg/dl COMPARISON:  Chest CT 05/13/2018 FINDINGS: Mediastinal blood pool activity: SUV max 3.1 NECK: No hypermetabolic lymph nodes in the neck. Incidental CT findings: none CHEST: The known spiculated 3.7 cm right upper lobe mass is markedly hypermetabolic with SUV max = 99.3. There Is hypermetabolism in the right hilum mildly elevated above background blood pool levels. This corresponds to the 7 mm  short axis right hilar lymph node seen on previous imaging and SUV max = 4.4. No other hypermetabolic mediastinal or left hilar disease evident. No other hypermetabolic lymphadenopathy in the chest. Incidental CT findings: Emphysema evident bilaterally. There is abdominal aortic atherosclerosis without aneurysm. ABDOMEN/PELVIS: No abnormal hypermetabolic activity within the liver, pancreas, adrenal glands, or spleen. No hypermetabolic lymph nodes in the abdomen or pelvis. Focal hypermetabolism identified in the anus, likely physiologic. Incidental CT findings: There is abdominal aortic atherosclerosis without aneurysm. SKELETON: No focal hypermetabolic activity to suggest skeletal metastasis. Incidental CT findings: No worrisome lytic or sclerotic osseous abnormality. IMPRESSION: 1. Known right upper lobe mass is markedly hypermetabolic, consistent with malignancy. 2. Focal hypermetabolism in the right hilum, suspicious for metastatic spread. 3. No hypermetabolic metastases in the neck, abdomen, or pelvis. 4. Focal hypermetabolism in the anus, likely physiologic. Correlation with colorectal cancer screening history recommended. 5.  Emphysema. (ICD10-J43.9) 6.  Aortic Atherosclerois (ICD10-170.0) Electronically Signed   By: Misty Stanley M.D.   On: 05/13/2018 15:50   Discharge Medications: Allergies as of 05/31/2018      Reactions   Doxycycline    Caused major sun sensitivity.      Medication List  TAKE these medications   albuterol 108 (90 Base) MCG/ACT inhaler Commonly known as:  PROVENTIL HFA;VENTOLIN HFA Inhale 2 puffs into the lungs every 6 (six) hours as needed for wheezing or shortness of breath.   ALPRAZolam 0.5 MG tablet Commonly known as:  XANAX TAKE 1/2 TABLET(0.25 MG) BY MOUTH TWICE DAILY AS NEEDED FOR ANXIETY OR SLEEP What changed:  See the new instructions.   diphenhydrAMINE 25 mg capsule Commonly known as:  BENADRYL Take 25 mg by mouth daily as needed for allergies.     ibuprofen 200 MG tablet Commonly known as:  ADVIL,MOTRIN Take 800 mg by mouth every 8 (eight) hours as needed (pain).   loperamide 2 MG tablet Commonly known as:  IMODIUM A-D Take 2 mg 4 (four) times daily as needed by mouth for diarrhea or loose stools.   metoprolol succinate 25 MG 24 hr tablet Commonly known as:  TOPROL-XL Take 1 tablet (25 mg total) by mouth daily.   multivitamin tablet Take 2 tablets by mouth daily. Reported on 11/27/2015   oxyCODONE 5 MG immediate release tablet Commonly known as:  Oxy IR/ROXICODONE Take 5 mg by mouth every 4-6 hours PRN severe pain.   pantoprazole 40 MG tablet Commonly known as:  PROTONIX TAKE 1 TABLET BY MOUTH DAILY   QUEtiapine 25 MG tablet Commonly known as:  SEROQUEL TAKE 1 TABLET(25 MG) BY MOUTH AT BEDTIME What changed:  See the new instructions.   rosuvastatin 5 MG tablet Commonly known as:  CRESTOR Take 1 tablet (5 mg total) by mouth daily.   sertraline 50 MG tablet Commonly known as:  ZOLOFT Take 1 tablet (50 mg total) by mouth daily.       Follow Up Appointments: Follow-up Information    Prescott Gum, Collier Salina, MD. Go on 06/16/2018.   Specialty:  Cardiothoracic Surgery Why:  PA/LAT CXR to be taken (at [facility] which is in the same building as Dr. Lucianne Lei Trigt's office) on 06/16/2018 at 9:00 am;Appointment time is at 9:30 am Contact information: [place] [address] [phone]           Signed: Sharalyn Ink [facility] 05/31/2018, 9:10 AM  patient examined and medical record reviewed,agree with above note. Tharon Aquas Trigt III 06/07/2018

## 2018-05-24 NOTE — Progress Notes (Addendum)
[facility] DAILY ICU PROGRESS NOTE                   [address]            [place] [address]          [phone]   1 Day Post-Op Procedure(s) (LRB): RIGHT UPPER LOBE VIDEO ASSISTED THORACOSCOPY (VATS) FOR BIOPSY (Right)  Total Length of Stay:  LOS: 1 day   Subjective: Patient hungry and has incisional pain when moves or coughs.  Objective: Vital signs in last 24 hours: Temp:  [97.5 F (36.4 C)-98.2 F (36.8 C)] 97.7 F (36.5 C) (12/10 0400) Pulse Rate:  [68-91] 86 (12/10 0600) Cardiac Rhythm: Normal sinus rhythm (12/10 0400) Resp:  [10-28] 28 (12/10 0600) BP: (106-144)/(62-98) 117/76 (12/09 2100) SpO2:  [94 %-100 %] 100 % (12/10 0600) Arterial Line BP: (107-147)/(54-81) 139/74 (12/10 0600) FiO2 (%):  [40 %] 40 % (12/09 1507) Weight:  [72.3 kg-74 kg] 74 kg (12/10 0600)  Filed Weights   05/23/18 1219 05/24/18 0600  Weight: 72.3 kg 74 kg    Weight change:    Hemodynamic parameters for last 24 hours:    Intake/Output from previous day: 12/09 0701 - 12/10 0700 In: 2652.3 [P.O.:684; I.V.:1768.3; IV Piggyback:200] Out: 1326 [Urine:1060; Blood:100; Chest Tube:166]  Intake/Output this shift: No intake/output data recorded.  Current Meds: Scheduled Meds: . acetaminophen  1,000 mg Oral Q6H   Or  . acetaminophen (TYLENOL) oral liquid 160 mg/5 mL  1,000 mg Oral Q6H  . bisacodyl  10 mg Oral Daily  . fentaNYL   Intravenous Q4H  . insulin aspart  0-24 Units Subcutaneous Q6H  . levalbuterol  1.25 mg Nebulization QID  . metoprolol succinate  25 mg Oral Daily  . pantoprazole  40 mg Oral Daily  . QUEtiapine  25 mg Oral QHS  . rosuvastatin  5 mg Oral Daily  . senna-docusate  1 tablet Oral QHS  . sertraline  50 mg Oral Daily   Continuous Infusions: . 0.9 % NaCl with KCl 20 mEq / L 50 mL/hr at 05/24/18 0600  . potassium chloride     PRN Meds:.ALPRAZolam, diphenhydrAMINE **OR** diphenhydrAMINE, naloxone **AND** sodium chloride flush, ondansetron (ZOFRAN)  IV, oxyCODONE, potassium chloride, traMADol  General appearance: alert, cooperative and no distress Neurologic: intact Heart: RRR Lungs: Clear on the left and somewhat coarse on the right Abdomen: Soft, non tender, bowel sounds present Extremities: Wearing high socks this am but no calf tenderness Wound: Dressing is clean and dry Chest tube: to suction, no air leak but tidling with cough  Lab Results: CBC: Recent Labs    05/24/18 0535  WBC 9.7  HGB 10.9*  HCT 35.0*  PLT 232   BMET: No results for input(s): NA, K, CL, CO2, GLUCOSE, BUN, CREATININE, CALCIUM in the last 72 hours.  CMET: Lab Results  Component Value Date   WBC 9.7 05/24/2018   HGB 10.9 (L) 05/24/2018   HCT 35.0 (L) 05/24/2018   PLT 232 05/24/2018   GLUCOSE 104 (H) 05/19/2018   CHOL 206 (H) 03/09/2018   TRIG 209.0 (H) 03/09/2018   HDL 91.60 03/09/2018   LDLDIRECT 79.0 03/09/2018   LDLCALC 156 (H) 12/28/2017   ALT 21 05/19/2018   AST 27 05/19/2018   NA 140 05/19/2018   K 3.8 05/19/2018   CL 104 05/19/2018   CREATININE 0.84 05/19/2018   BUN 14 05/19/2018   CO2 28 05/19/2018   TSH 1.15 04/13/2018   INR  1.02 05/19/2018      PT/INR: No results for input(s): LABPROT, INR in the last 72 hours. Radiology: Dg Chest Port 1 View  Result Date: 05/23/2018 CLINICAL DATA:  S/p right upper lobe VATS. Hx of breast ca, HTN EXAM: PORTABLE CHEST 1 VIEW COMPARISON:  05/23/2018 at 7:30 a.m. FINDINGS: Since the earlier exam, right-sided thoracic surgery has been performed. The upper lobe mass noted on the prior exam is no longer appreciated. There is wedge-shaped opacity extending from the superior right hilum to the right apex associated with a pulmonary anastomosis staple line. This is consistent with postoperative atelectasis/change. New right chest tube has its tip at the right apex. No pneumothorax. Remainder of the lungs is clear. No pleural effusion. No left pneumothorax. IMPRESSION: 1. Status post right upper lobe  resection since the earlier exam. There are the expected postoperative changes. No pneumothorax or evidence of an operative complication. Right chest tube is well positioned. Electronically Signed   By: Lajean Manes M.D.   On: 05/23/2018 12:13     Assessment/Plan: S/P Procedure(s) (LRB): RIGHT UPPER LOBE VIDEO ASSISTED THORACOSCOPY (VATS) FOR BIOPSY (Right)   1. CV-SR in the 80-90's. Toprol XL 25 mg starting today (as was on prior to surgery). 2. Pulmonary-ABG results this am noted-PCO2 50.4. Chest tubes with slightly less than 170 cc since surgery. Chest tubes are to suction. There is no air leak but tidling with cough. CXR this am appears stable (no pneumothorax). Hope to place to water seal soon. On 4 liters of oxygen via [place]. Will wean over next few days. Encourage incentive spirometer. 3. ABL anemia-H and H slightly decreased to 10.9 and 35. 4. Remove a line and foley, decrease IVF 5. CBGs 130/141/175. No prior history of diabetes. Will stop accu checks and SS PRN. 6. Lovenox for prophylaxis  Donielle Liston Alba PA-C 05/24/2018 7:33 AM  Chest tube to water seal, add toradol 4 doses patient examined and medical record reviewed,agree with above note. Tharon Aquas Trigt III 05/24/2018

## 2018-05-24 NOTE — Discharge Instructions (Signed)
Thoracotomy, Care After This sheet gives you information about how to care for yourself after your procedure. Your doctor may also give you more specific instructions. If you have problems or questions, contact your doctor. Follow these instructions at home: Preventing lung infection ( pneumonia)  Take deep breaths or do breathing exercises as told by your doctor.  Cough often. Coughing is important to clear thick spit (phlegm) and open your lungs. If coughing hurts, hold a pillow against your chest or place both hands flat on top of your cut (splinting) when you cough. This may help with discomfort.  Use an incentive spirometer as told. This is a tool that measures how well you fill your lungs with each breath.  Do lung therapy (pulmonary rehabilitation) as told. Medicines  Take over-the-counter or prescription medicines only as told by your doctor.  If you have pain, take pain-relieving medicine before your pain gets very bad. This will help you breathe and cough more comfortably.  If you were prescribed an antibiotic medicine, take it as told by your doctor. Do not stop taking the antibiotic even if you start to feel better. Activity  Ask your doctor what activities are safe for you.  Do not travel by airplane for 2 weeks after your chest tube is removed, or until your doctor says that this is safe.  Do not lift anything that is heavier than 10 lb (4.5 kg), or the limit that your doctor tells you, until he or she says that it is safe.  Do not drive until your doctor approves. ? Do not drive or use heavy machinery while taking prescription pain medicine. Incision care  Follow instructions from your doctor about how to take care of your cut from surgery (incision). Make sure you: ? Wash your hands with soap and water before you change your bandage (dressing). If you cannot use soap and water, use hand sanitizer. ? Change your bandage as told by your doctor. ? Leave stitches  (sutures), skin glue, or skin tape (adhesive) strips in place. They may need to stay in place for 2 weeks or longer. If tape strips get loose and curl up, you may trim the loose edges. Do not remove tape strips completely unless your doctor says it is okay.  Keep your bandage dry.  Check your cut from surgery every day for signs of infection. Check for: ? More redness, swelling, or pain. ? More fluid or blood. ? Warmth. ? Pus or a bad smell. Bathing  Do not take baths, swim, or use a hot tub until your doctor approves. You may take showers.  After your bandage has been removed, use soap and water to gently wash your cut from surgery. Do not use anything else to clean your cut unless your doctor tells you to. Eating and drinking  Eat a healthy diet as told by your doctor. A healthy diet includes: ? Fresh fruits and vegetables. ? Whole grains. ? Low-fat (lean) proteins.  Drink enough fluid to keep your pee (urine) clear or pale yellow. General instructions  To prevent or treat trouble pooping (constipation) while you are taking prescription pain medicine, your doctor may recommend that you: ? Take over-the-counter or prescription medicines. ? Eat foods that are high in fiber. These include fresh fruits and vegetables, whole grains, and beans. ? Limit foods that are high in fat and processed sugars, such as fried and sweet foods.  Do not use any products that contain nicotine or tobacco. These include cigarettes  and e-cigarettes. If you need help quitting, ask your doctor.  Avoid secondhand smoke.  Wear compression stockings as told. These help to prevent blood clots and reduce swelling in your legs.  If you have a chest tube, care for it as told.  Keep all follow-up visits as told by your doctor. This is important. Contact a doctor if:  You have more redness, swelling, or pain around your cut from surgery.  You have more fluid or blood coming from your cut from  surgery.  Your cut from surgery feels warm to the touch.  You have pus or a bad smell coming from your cut from surgery.  You have a fever or chills.  Your heartbeat seems uneven.  You feel sick to your stomach (nauseous).  You throw up (vomit).  You have muscle aches.  You have trouble pooping (having a bowel movement). This may mean that you: ? Poop fewer times in a week than normal. ? Have a hard time pooping. ? Have poop that is dry, hard, or bigger than normal. Get help right away if:  You get a rash.  You feel light-headed.  You feel like you might pass out (faint).  You are short of breath.  You have trouble breathing.  You are confused.  You have trouble talking.  You have problems with your seeing (vision).  You are not able to move.  You lose feeling (have numbness) in your: ? Face. ? Arms. ? Legs.  You pass out.  You have a sudden, bad headache.  You feel weak.  You have chest pain.  You have pain that: ? Is very bad. ? Gets worse, even with medicine. Summary  Take deep breaths, do breathing exercises, and cough often. This helps prevent lung infection (pneumonia).  Do not drive until your doctor approves. Do not travel by airplane for 2 weeks after your chest tube is removed, or until your doctor says that this is safe.  Check your cut from surgery every day for signs of infection.  Eat a healthy diet. This includes fresh fruits and vegetables, whole grains, and low-fat (lean) proteins. This information is not intended to replace advice given to you by your health care provider. Make sure you discuss any questions you have with your health care provider. Document Released: 12/01/2011 Document Revised: 02/24/2016 Document Reviewed: 02/24/2016 Elsevier Interactive Patient Education  2017 [employer].

## 2018-05-25 ENCOUNTER — Encounter (HOSPITAL_COMMUNITY): Payer: Self-pay

## 2018-05-25 ENCOUNTER — Inpatient Hospital Stay (HOSPITAL_COMMUNITY): Payer: BLUE CROSS/BLUE SHIELD

## 2018-05-25 LAB — COMPREHENSIVE METABOLIC PANEL
ALT: 15 U/L (ref 0–44)
AST: 38 U/L (ref 15–41)
Albumin: 3.3 g/dL — ABNORMAL LOW (ref 3.5–5.0)
Alkaline Phosphatase: 53 U/L (ref 38–126)
Anion gap: 10 (ref 5–15)
BUN: 15 mg/dL (ref 8–23)
CO2: 28 mmol/L (ref 22–32)
Calcium: 8.4 mg/dL — ABNORMAL LOW (ref 8.9–10.3)
Chloride: 101 mmol/L (ref 98–111)
Creatinine, Ser: 0.92 mg/dL (ref 0.44–1.00)
GFR calc Af Amer: >60 mL/min (ref >60)
GFR calc non Af Amer: >60 mL/min (ref >60)
Glucose, Bld: 105 mg/dL — ABNORMAL HIGH (ref 70–99)
POTASSIUM: 3.9 mmol/L (ref 3.5–5.1)
Sodium: 139 mmol/L (ref 135–145)
Total Bilirubin: 0.4 mg/dL (ref 0.3–1.2)
Total Protein: 6 g/dL — ABNORMAL LOW (ref 6.5–8.1)

## 2018-05-25 LAB — CBC
HEMATOCRIT: 33.9 % — AB (ref 36.0–46.0)
Hemoglobin: 10.7 g/dL — ABNORMAL LOW (ref 12.0–15.0)
MCH: 33.8 pg (ref 26.0–34.0)
MCHC: 31.6 g/dL (ref 30.0–36.0)
MCV: 106.9 fL — AB (ref 80.0–100.0)
NRBC: 0 % (ref 0.0–0.2)
Platelets: 198 10*3/uL (ref 150–400)
RBC: 3.17 MIL/uL — ABNORMAL LOW (ref 3.87–5.11)
RDW: 12.2 % (ref 11.5–15.5)
WBC: 7.4 10*3/uL (ref 4.0–10.5)

## 2018-05-25 MED ORDER — POTASSIUM CHLORIDE CRYS ER 20 MEQ PO TBCR
20.0000 meq | EXTENDED_RELEASE_TABLET | Freq: Once | ORAL | Status: AC
  Administered 2018-05-25: 20 meq via ORAL
  Filled 2018-05-25: qty 1

## 2018-05-25 NOTE — Progress Notes (Addendum)
      [address]       [place] [address]             [phone]       2 Days Post-Op Procedure(s) (LRB): RIGHT UPPER LOBE VIDEO ASSISTED THORACOSCOPY (VATS) FOR BIOPSY (Right)  Subjective: Patient with loose stools  Objective: Vital signs in last 24 hours: Temp:  [97.7 F (36.5 C)-98.6 F (37 C)] 98.6 F (37 C) (12/11 0414) Pulse Rate:  [51-98] 80 (12/11 0414) Cardiac Rhythm: Normal sinus rhythm (12/11 0414) Resp:  [13-25] 15 (12/11 0414) BP: (121-152)/(74-103) 139/84 (12/11 0414) SpO2:  [86 %-100 %] 98 % (12/11 0414) Arterial Line BP: (157)/(88) 157/88 (12/10 0800)      Intake/Output from previous day: 12/10 0701 - 12/11 0700 In: 896.7 [P.O.:600; I.V.:196.9; IV Piggyback:99.9] Out: 765 [Urine:685; Chest Tube:80]   Physical Exam:  Cardiovascular: RRR Pulmonary: Clear to auscultation on the left and coarse on the right Abdomen: Soft, non tender, bowel sounds present. Extremities: Mild bilateral lower extremity edema. Wounds: Clean and dry.  No erythema or signs of infection. Chest Tube: to water seal, air leak with cough  Lab Results: CBC: Recent Labs    05/24/18 0535 05/25/18 0254  WBC 9.7 7.4  HGB 10.9* 10.7*  HCT 35.0* 33.9*  PLT 232 198   BMET:  Recent Labs    05/24/18 0535 05/25/18 0254  NA 137 139  K 4.5 3.9  CL 99 101  CO2 25 28  GLUCOSE 126* 105*  BUN 13 15  CREATININE 0.91 0.92  CALCIUM 8.4* 8.4*    PT/INR: No results for input(s): LABPROT, INR in the last 72 hours. ABG:  INR: Will add last result for INR, ABG once components are confirmed Will add last 4 CBG results once components are confirmed  Assessment/Plan: 1. CV-SR in the 90's this am. Toprol XL 25 mg starting today (as was on prior to surgery). 2. Pulmonary-Chest tubes with slightly less than 80 cc last 24 hours. Chest tube is to water seal There is an air leak with cough. CXR this am appears stable (some subcutaneous emphysema right lateral chest  wall). On 2 liters of oxygen via [employer]. Will continue to wean over next few days. Encourage incentive spirometer. 3. ABL anemia-H and H slightly decreased to 10.7 and 33.9 4. Supplement potassium 5. CBGs 130/141/175. No prior history of diabetes. Will stop accu checks and SS PRN. 6. Lovenox for prophylaxis 7. 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 patient examined and medical record reviewed,agree with above note. Tharon Aquas Trigt III 05/25/2018

## 2018-05-25 NOTE — Anesthesia Postprocedure Evaluation (Signed)
Anesthesia Post Note  Patient: Sarah Weeks  Procedure(s) Performed: RIGHT UPPER LOBE VIDEO ASSISTED THORACOSCOPY (VATS) FOR BIOPSY (Right Chest)     Patient location during evaluation: PACU Anesthesia Type: General Level of consciousness: awake and alert Pain management: pain level controlled Vital Signs Assessment: post-procedure vital signs reviewed and stable Respiratory status: spontaneous breathing, nonlabored ventilation, respiratory function stable and patient connected to nasal cannula oxygen Cardiovascular status: blood pressure returned to baseline and stable Postop Assessment: no apparent nausea or vomiting Anesthetic complications: no    Last Vitals:  Vitals:   05/25/18 1111 05/25/18 1200  BP:    Pulse:    Resp:  18  Temp:    SpO2: 99% 99%    Last Pain:  Vitals:   05/25/18 1200  TempSrc:   PainSc: 0-No pain                 Oland Arquette

## 2018-05-25 NOTE — Progress Notes (Signed)
Neb given through [employer], patient tolerated it well. No distress or complications noted

## 2018-05-25 NOTE — Plan of Care (Signed)

## 2018-05-26 ENCOUNTER — Inpatient Hospital Stay (HOSPITAL_COMMUNITY): Payer: BLUE CROSS/BLUE SHIELD

## 2018-05-26 MED ORDER — ALBUTEROL SULFATE (2.5 MG/3ML) 0.083% IN NEBU
2.5000 mg | INHALATION_SOLUTION | Freq: Four times a day (QID) | RESPIRATORY_TRACT | Status: DC
  Administered 2018-05-28 (×2): 2.5 mg via RESPIRATORY_TRACT
  Filled 2018-05-26 (×6): qty 3

## 2018-05-26 MED ORDER — LEVALBUTEROL HCL 1.25 MG/0.5ML IN NEBU: 1.2500 mg | INHALATION_SOLUTION | RESPIRATORY_TRACT | Status: DC | PRN

## 2018-05-26 MED ORDER — ALBUTEROL SULFATE (2.5 MG/3ML) 0.083% IN NEBU
2.5000 mg | INHALATION_SOLUTION | RESPIRATORY_TRACT | Status: DC
  Administered 2018-05-26: 2.5 mg via RESPIRATORY_TRACT
  Filled 2018-05-26 (×2): qty 3

## 2018-05-26 NOTE — Progress Notes (Signed)
      [address]       [place] [address]             [phone]       3 Days Post-Op Procedure(s) (LRB): RIGHT UPPER LOBE VIDEO ASSISTED THORACOSCOPY (VATS) FOR BIOPSY (Right)  Subjective: Patient had a "regular" bowel movement yesterday. She has no specific complaint this am. She hopes chest tube gets removed soon  Objective: Vital signs in last 24 hours: Temp:  [98.7 F (37.1 C)-99.7 F (37.6 C)] 98.7 F (37.1 C) (12/12 0440) Pulse Rate:  [86-93] 86 (12/11 2012) Cardiac Rhythm: Normal sinus rhythm (12/12 0440) Resp:  [18-28] 21 (12/12 0441) BP: (120-160)/(75-88) 134/86 (12/12 0441) SpO2:  [92 %-99 %] 99 % (12/12 0440)      Intake/Output from previous day: 12/11 0701 - 12/12 0700 In: 370 [P.O.:240; I.V.:130] Out: 430 [Urine:350; Chest Tube:80]   Physical Exam:  Cardiovascular: RRR Pulmonary: Clear to auscultation on the left and coarse on the right Abdomen: Soft, non tender, bowel sounds present. Extremities:No lower extremity edema. Wounds: Clean and dry.  No erythema or signs of infection. Chest Tube: to water seal, +1 air leak with cough  Lab Results: CBC: Recent Labs    05/24/18 0535 05/25/18 0254  WBC 9.7 7.4  HGB 10.9* 10.7*  HCT 35.0* 33.9*  PLT 232 198   BMET:  Recent Labs    05/24/18 0535 05/25/18 0254  NA 137 139  K 4.5 3.9  CL 99 101  CO2 25 28  GLUCOSE 126* 105*  BUN 13 15  CREATININE 0.91 0.92  CALCIUM 8.4* 8.4*    PT/INR: No results for input(s): LABPROT, INR in the last 72 hours. ABG:  INR: Will add last result for INR, ABG once components are confirmed Will add last 4 CBG results once components are confirmed  Assessment/Plan: 1. CV-SR in the 90's this am. On Toprol XL 25 mg (as was on prior to surgery). 2. Pulmonary-Chest tubes with slightly less than 80 cc last 24 hours. Chest tube is to water seal There is a +1 air leak with cough. CXR this am appears stable (some subcutaneous emphysema right  lateral chest wall). Chest tube to remain for now. On 1 liter of oxygen via . Will continue to wean. Encourage incentive spirometer. 3. ABL anemia-H and H slightly decreased to 10.7 and 33.9   Sarah Weeks M ZimmermanPA-C 05/26/2018,7:09 AM [phone]

## 2018-05-26 NOTE — Plan of Care (Signed)
  Problem: Education: Goal: Knowledge of the prescribed therapeutic regimen will improve Outcome: Progressing   Problem: Activity: Goal: Risk for activity intolerance will decrease Outcome: Progressing   Problem: Clinical Measurements: Goal: Ability to maintain clinical measurements within normal limits will improve Outcome: Progressing Goal: Diagnostic test results will improve Outcome: Progressing   Problem: Education: Goal: Knowledge of disease or condition will improve Outcome: Adequate for Discharge   Problem: Cardiac: Goal: Will achieve and/or maintain hemodynamic stability Outcome: Adequate for Discharge   Problem: Clinical Measurements: Goal: Postoperative complications will be avoided or minimized Outcome: Adequate for Discharge   Problem: Respiratory: Goal: Respiratory status will improve Outcome: Adequate for Discharge   Problem: Pain Management: Goal: Pain level will decrease Outcome: Adequate for Discharge   Problem: Skin Integrity: Goal: Wound healing without signs and symptoms infection will improve Outcome: Adequate for Discharge   Problem: Education: Goal: Knowledge of General Education information will improve Description Including pain rating scale, medication(s)/side effects and non-pharmacologic comfort measures Outcome: Adequate for Discharge   Problem: Health Behavior/Discharge Planning: Goal: Ability to manage health-related needs will improve Outcome: Adequate for Discharge   Problem: Clinical Measurements: Goal: Will remain free from infection Outcome: Adequate for Discharge Goal: Respiratory complications will improve Outcome: Adequate for Discharge Goal: Cardiovascular complication will be avoided Outcome: Adequate for Discharge   Problem: Activity: Goal: Risk for activity intolerance will decrease Outcome: Adequate for Discharge   Problem: Nutrition: Goal: Adequate nutrition will be maintained Outcome: Adequate for Discharge   Problem: Coping: Goal: Level of anxiety will decrease Outcome: Adequate for Discharge   Problem: Elimination: Goal: Will not experience complications related to bowel motility Outcome: Adequate for Discharge Goal: Will not experience complications related to urinary retention Outcome: Adequate for Discharge   Problem: Pain Managment: Goal: General experience of comfort will improve Outcome: Adequate for Discharge   Problem: Safety: Goal: Ability to remain free from injury will improve Outcome: Adequate for Discharge   Problem: Skin Integrity: Goal: Risk for impaired skin integrity will decrease Outcome: Adequate for Discharge

## 2018-05-27 ENCOUNTER — Inpatient Hospital Stay (HOSPITAL_COMMUNITY): Payer: BLUE CROSS/BLUE SHIELD

## 2018-05-27 NOTE — Progress Notes (Addendum)
      [address]       [place] [address]             [phone]       4 Days Post-Op Procedure(s) (LRB): RIGHT UPPER LOBE VIDEO ASSISTED THORACOSCOPY (VATS) FOR BIOPSY (Right)  Subjective: Patient has no specific complaint but does hope chest tube is removed  Objective: Vital signs in last 24 hours: Temp:  [98 F (36.7 C)-99.4 F (37.4 C)] 98.8 F (37.1 C) (12/13 0313) Pulse Rate:  [94-105] 95 (12/13 0313) Cardiac Rhythm: Normal sinus rhythm (12/12 2322) Resp:  [15-31] 16 (12/13 0315) BP: (122-138)/(74-91) 132/91 (12/13 0313) SpO2:  [98 %-100 %] 100 % (12/13 0315)     Intake/Output from previous day: 12/12 0701 - 12/13 0700 In: -  Out: 59 [Chest Tube:60]   Physical Exam:  Cardiovascular: RRR Pulmonary: Clear to auscultation on the left and coarse on the right Abdomen: Soft, non tender, bowel sounds present. Extremities:No lower extremity edema. Wounds: Clean and dry.  No erythema or signs of infection. Chest Tube: to water seal, intermittent +1 air leak with cough  Lab Results: CBC: Recent Labs    05/25/18 0254  WBC 7.4  HGB 10.7*  HCT 33.9*  PLT 198   BMET:  Recent Labs    05/25/18 0254  NA 139  K 3.9  CL 101  CO2 28  GLUCOSE 105*  BUN 15  CREATININE 0.92  CALCIUM 8.4*    PT/INR: No results for input(s): LABPROT, INR in the last 72 hours. ABG:  INR: Will add last result for INR, ABG once components are confirmed Will add last 4 CBG results once components are confirmed  Assessment/Plan: 1. CV-SR in the 90's this am. On Toprol XL 25 mg (as was on prior to surgery). 2. Pulmonary-Chest tube with slightly less than 60 cc last 24 hours. Chest tube is to water seal There is an intermittent + 1 air leak with cough but Pleura Vac had been tipped over. Nurse  changeed Pleura VAC a little while ago. CXR this am appears stable (some subcutaneous emphysema right lateral chest wall, small right apical pneumothorax). Chest tube to  remain for now. On room air. Encourage incentive spirometer. 3. ABL anemia-Last H and H slightly decreased to 10.7 and 33.9   Donielle M ZimmermanPA-C 05/27/2018,6:08 AM [phone] Persistent mild air leak with cough Leave Tube in place [O2 weaned off with adequate sats  patient examined and medical record reviewed,agree with above note. Tharon Aquas Trigt III 05/27/2018

## 2018-05-27 NOTE — Progress Notes (Signed)
Patient's Fentanyl syringe changed at 1950. Cleared last used amount and wasted 2.40ml from old syringe. Witnessed by Pecolia Ades, RN and Erlene Quan, RN. Liquid was wasted in liquid control container. Patient's PCA is performing as ordered. Will continue to monitor pain control.

## 2018-05-27 NOTE — Plan of Care (Signed)

## 2018-05-28 ENCOUNTER — Inpatient Hospital Stay (HOSPITAL_COMMUNITY): Payer: BLUE CROSS/BLUE SHIELD

## 2018-05-28 LAB — TYPE AND SCREEN
ABO/RH(D): B NEG
Antibody Screen: NEGATIVE
UNIT DIVISION: 0
Unit division: 0

## 2018-05-28 LAB — BPAM RBC
Blood Product Expiration Date: 201912202359
Blood Product Expiration Date: 201912212359
Unit Type and Rh: 1700
Unit Type and Rh: 1700

## 2018-05-28 MED ORDER — ZOLPIDEM TARTRATE 5 MG PO TABS
5.0000 mg | ORAL_TABLET | Freq: Every evening | ORAL | Status: DC | PRN
  Administered 2018-05-28 – 2018-05-30 (×3): 5 mg via ORAL
  Filled 2018-05-28 (×3): qty 1

## 2018-05-28 NOTE — Progress Notes (Addendum)
[address]       [place] [address]             [phone]      5 Days Post-Op Procedure(s) (LRB): RIGHT UPPER LOBE VIDEO ASSISTED THORACOSCOPY (VATS) FOR BIOPSY (Right) Subjective: + air leak on H20 seal C/O insomnia + anxious  Objective: Vital signs in last 24 hours: Temp:  [98.3 F (36.8 C)-99.7 F (37.6 C)] 98.6 F (37 C) (12/14 0740) Pulse Rate:  [92-108] 98 (12/14 0409) Cardiac Rhythm: Normal sinus rhythm (12/14 0409) Resp:  [15-24] 21 (12/14 0740) BP: (110-138)/(72-90) 124/82 (12/14 0740) SpO2:  [95 %-99 %] 99 % (12/14 0740)  Hemodynamic parameters for last 24 hours:    Intake/Output from previous day: 12/13 0701 - 12/14 0700 In: 1520.5 [P.O.:1200; I.V.:320.5] Out: -  Intake/Output this shift: No intake/output data recorded.  General appearance: alert, cooperative and no distress Heart: regular rate and rhythm Lungs: clear to auscultation bilaterally Abdomen: benign Extremities: no edema or calf tendernes Wound: incis healing well  Lab Results: No results for input(s): WBC, HGB, HCT, PLT in the last 72 hours. BMET: No results for input(s): NA, K, CL, CO2, GLUCOSE, BUN, CREATININE, CALCIUM in the last 72 hours.  PT/INR: No results for input(s): LABPROT, INR in the last 72 hours. ABG    Component Value Date/Time   PHART 7.366 05/24/2018 0520   HCO3 29.0 (H) 05/24/2018 0520   TCO2 31 05/24/2018 0520   O2SAT 98.0 05/24/2018 0520   CBG (last 3)  No results for input(s): GLUCAP in the last 72 hours.  Meds Scheduled Meds: . acetaminophen  1,000 mg Oral Q6H   Or  . acetaminophen (TYLENOL) oral liquid 160 mg/5 mL  1,000 mg Oral Q6H  . albuterol  2.5 mg Inhalation QID  . enoxaparin (LOVENOX) injection  40 mg Subcutaneous Q24H  . fentaNYL   Intravenous Q4H  . furosemide  20 mg Intravenous BID  . metoprolol succinate  25 mg Oral Daily  . pantoprazole  40 mg Oral Daily  . QUEtiapine  25 mg Oral QHS  . rosuvastatin  5 mg Oral  Daily  . senna-docusate  1 tablet Oral QHS  . sertraline  50 mg Oral Daily   Continuous Infusions: . 0.9 % NaCl with KCl 20 mEq / L 10 mL/hr at 05/28/18 0400   PRN Meds:.ALPRAZolam, diphenhydrAMINE **OR** diphenhydrAMINE, naloxone **AND** sodium chloride flush, ondansetron (ZOFRAN) IV, oxyCODONE, traMADol  Xrays Dg Chest Port 1 View  Result Date: 05/27/2018 CLINICAL DATA:  Chest tube, pneumothorax EXAM: PORTABLE CHEST 1 VIEW COMPARISON:  05/26/2018 FINDINGS: Right chest tube remains in place. Postoperative changes in the right lung. Small right apical pneumothorax. Subcutaneous emphysema in the right chest wall is stable. Airspace disease in the right upper lobe may be postoperative. Heart is normal size. Left lung clear. No effusions. IMPRESSION: Postoperative changes on the right with right chest tube in place. Stable small residual right pneumothorax and subcutaneous emphysema. Electronically Signed   By: Rolm Baptise M.D.   On: 05/27/2018 08:26    Assessment/Plan: S/P Procedure(s) (LRB): RIGHT UPPER LOBE VIDEO ASSISTED THORACOSCOPY (VATS) FOR BIOPSY (Right)  1 hemodyn stable in sinus, some sinus tachy 2 + air leak with cough conts, + SQ air, CXR pending 3 no new labs 4 cont current management, could potentially change to [employer]  LOS: 5 days    Sarah Giovanni PA-C 05/28/2018 Pager [phone]  I have seen and examined the  patient and agree with the assessment and plan as outlined.   CXR looks okay.  Sarah Alberts, MD 05/28/2018 1:32 PM

## 2018-05-29 ENCOUNTER — Inpatient Hospital Stay (HOSPITAL_COMMUNITY): Payer: BLUE CROSS/BLUE SHIELD

## 2018-05-29 LAB — BASIC METABOLIC PANEL
Anion gap: 16 — ABNORMAL HIGH (ref 5–15)
BUN: 9 mg/dL (ref 8–23)
CO2: 33 mmol/L — ABNORMAL HIGH (ref 22–32)
Calcium: 9.1 mg/dL (ref 8.9–10.3)
Chloride: 86 mmol/L — ABNORMAL LOW (ref 98–111)
Creatinine, Ser: 0.9 mg/dL (ref 0.44–1.00)
GFR calc Af Amer: >60 mL/min (ref >60)
GLUCOSE: 131 mg/dL — AB (ref 70–99)
Potassium: 3.1 mmol/L — ABNORMAL LOW (ref 3.5–5.1)
Sodium: 135 mmol/L (ref 135–145)

## 2018-05-29 MED ORDER — POTASSIUM CHLORIDE CRYS ER 20 MEQ PO TBCR
40.0000 meq | EXTENDED_RELEASE_TABLET | Freq: Once | ORAL | Status: AC
  Administered 2018-05-29: 40 meq via ORAL
  Filled 2018-05-29: qty 2

## 2018-05-29 NOTE — Progress Notes (Addendum)
[address]       [place] [address]             [phone]      6 Days Post-Op Procedure(s) (LRB): RIGHT UPPER LOBE VIDEO ASSISTED THORACOSCOPY (VATS) FOR BIOPSY (Right) Subjective: Feeling better, tiny air leak on H2O seal  Objective: Vital signs in last 24 hours: Temp:  [98.3 F (36.8 C)-100 F (37.8 C)] 98.3 F (36.8 C) (12/15 1143) Pulse Rate:  [102-106] 104 (12/15 1143) Cardiac Rhythm: Sinus tachycardia (12/15 0706) Resp:  [18-22] 18 (12/15 1143) BP: (111-123)/(71-82) 123/82 (12/15 1143) SpO2:  [95 %-99 %] 95 % (12/15 0911)  Hemodynamic parameters for last 24 hours:    Intake/Output from previous day: 12/14 0701 - 12/15 0700 In: 1165 [P.O.:965; I.V.:200] Out: -  Intake/Output this shift: No intake/output data recorded.  General appearance: alert, cooperative and no distress Heart: regular rate and rhythm and tachy Lungs: clear to auscultation bilaterally Abdomen: benign Extremities: no edema Wound: incis without signs of infection  Lab Results: No results for input(s): WBC, HGB, HCT, PLT in the last 72 hours. BMET: No results for input(s): NA, K, CL, CO2, GLUCOSE, BUN, CREATININE, CALCIUM in the last 72 hours.  PT/INR: No results for input(s): LABPROT, INR in the last 72 hours. ABG    Component Value Date/Time   PHART 7.366 05/24/2018 0520   HCO3 29.0 (H) 05/24/2018 0520   TCO2 31 05/24/2018 0520   O2SAT 98.0 05/24/2018 0520   CBG (last 3)  No results for input(s): GLUCAP in the last 72 hours.  Meds Scheduled Meds: . albuterol  2.5 mg Inhalation QID  . enoxaparin (LOVENOX) injection  40 mg Subcutaneous Q24H  . fentaNYL   Intravenous Q4H  . furosemide  20 mg Intravenous BID  . metoprolol succinate  25 mg Oral Daily  . pantoprazole  40 mg Oral Daily  . QUEtiapine  25 mg Oral QHS  . rosuvastatin  5 mg Oral Daily  . senna-docusate  1 tablet Oral QHS  . sertraline  50 mg Oral Daily   Continuous Infusions: . 0.9 % NaCl  with KCl 20 mEq / L 10 mL/hr at 05/29/18 0000   PRN Meds:.ALPRAZolam, diphenhydrAMINE **OR** diphenhydrAMINE, naloxone **AND** sodium chloride flush, ondansetron (ZOFRAN) IV, oxyCODONE, traMADol, zolpidem  Xrays Dg Chest Port 1 View  Result Date: 05/28/2018 CLINICAL DATA:  Followup pneumothorax. EXAM: PORTABLE CHEST 1 VIEW COMPARISON:  05/27/2018 FINDINGS: There is a right chest tube in place. a tiny right apical pneumothorax appears smaller on today's study. Unchanged postoperative changes within the right upper lobe. Heart size normal. No pleural effusion or edema. 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 IMPRESSION: 1. Right chest tube in place with tiny right apical pneumothorax. This appears slightly decreased in volume from previous exam. Electronically Signed   By: Kerby Moors M.D.   On: 05/28/2018 10:42    Assessment/Plan: S/P Procedure(s) (LRB): RIGHT UPPER LOBE VIDEO ASSISTED THORACOSCOPY (VATS) FOR BIOPSY (Right)  1 doing well 2 small air leak on water seal, if CXR OK poss d/c tube 3 d/c lasix - not sure why she was on it, tachycardic, will check BMET, cont metoprolol may be dry 4 d/c pca 5 home poss in am  LOS: 6 days    Sarah Weeks 05/29/2018  I have seen and examined the patient and agree with the assessment and plan as outlined.  Leave tube in place today  Rexene Alberts, MD 05/29/2018  1:17 PM

## 2018-05-29 NOTE — Progress Notes (Signed)
Fentanyl PCA pump d/c'd earlier today and syringe left in pump. Upon arrival to patients room, Fentanyl syringe removed from pump and 14ml was wasted into [employer] liquid container in pyxis room. Witnessed by Erlene Quan, RN and Jeannie Done, RN. Patient stated she was pain free and glad to be rid of her pumps. Will continue to monitor patients pain level through out the night.

## 2018-05-29 NOTE — Progress Notes (Signed)
Received order for Chest tube removal at 1330. Per Dr.Owen notes "Leave tube in place today". Notified PA about this, PA informed RN to leave chest tube in per Dr. Roxy Manns notes. Notified pt. Will continue to monitor.

## 2018-05-30 ENCOUNTER — Inpatient Hospital Stay (HOSPITAL_COMMUNITY): Payer: BLUE CROSS/BLUE SHIELD

## 2018-05-30 MED ORDER — MORPHINE SULFATE (PF) 2 MG/ML IV SOLN
2.0000 mg | Freq: Once | INTRAVENOUS | Status: DC
  Filled 2018-05-30: qty 1

## 2018-05-30 MED ORDER — SERTRALINE HCL 50 MG PO TABS: 50.0000 mg | ORAL_TABLET | Freq: Every day | ORAL | Status: DC

## 2018-05-30 MED ORDER — ALPRAZOLAM 0.5 MG PO TABS
0.5000 mg | ORAL_TABLET | Freq: Two times a day (BID) | ORAL | Status: DC | PRN
  Administered 2018-05-30: 0.5 mg via ORAL
  Filled 2018-05-30: qty 1

## 2018-05-30 NOTE — Progress Notes (Addendum)
      [address]       [name] [address]             [phone]       7 Days Post-Op Procedure(s) (LRB): RIGHT UPPER LOBE VIDEO ASSISTED THORACOSCOPY (VATS) FOR BIOPSY (Right)  Subjective: Patient without specific complaint this am.  Objective: Vital signs in last 24 hours: Temp:  [97.5 F (36.4 C)-98.8 F (37.1 C)] 98.2 F (36.8 C) (12/16 0730) Pulse Rate:  [104-107] 107 (12/16 0730) Cardiac Rhythm: Sinus tachycardia (12/16 0710) Resp:  [16-24] 16 (12/16 0730) BP: (111-132)/(69-85) 123/83 (12/16 0730) SpO2:  [91 %-95 %] 91 % (12/16 0730)     Intake/Output from previous day: 12/15 0701 - 12/16 0700 In: 120.8 [I.V.:120.8] Out: -    Physical Exam:  Cardiovascular: RRR Pulmonary: Mostly clear bilaterally Abdomen: Soft, non tender, bowel sounds present. Extremities:No lower extremity edema. Wounds: Clean and dry.  No erythema or signs of infection. Chest Tube: to water seal, no air leak so clamped  Lab Results: CBC: No results for input(s): WBC, HGB, HCT, PLT in the last 72 hours. BMET:  Recent Labs    05/29/18 1335  NA 135  K 3.1*  CL 86*  CO2 33*  GLUCOSE 131*  BUN 9  CREATININE 0.90  CALCIUM 9.1    PT/INR: No results for input(s): LABPROT, INR in the last 72 hours. ABG:  INR: Will add last result for INR, ABG once components are confirmed Will add last 4 CBG results once components are confirmed  Assessment/Plan: 1. CV-Tachycardic in the 110's. On Toprol XL 25 mg (as was on prior to surgery). Tachycardia may be related to being intravascularly dry as was given Lasix for several days.  2. Pulmonary- Chest tube is to water seal. Dr. Prescott Gum, clamped chest tube. Will re check in about one hour and if no air leak, will remove chest tube. CXR this am appears stable (some subcutaneous emphysema right lateral chest wall, small right apical pneumothorax). Chest tube to remain for now. On room air. Encourage incentive spirometer. 3.  ABL anemia-Last H and H slightly decreased to 10.7 and 33.9   Sarah M ZimmermanPA-C 05/30/2018,8:24 AM [phone] Air leak appears to be resolved.  Patient examined and chest x-ray reviewed. Will clamp tube and assess for collection of air after an hour Hope to remove tube later today VATS incision clean and dry  patient off PCA. Agree with above assessment and plan Dahlia Byes MD

## 2018-05-31 ENCOUNTER — Inpatient Hospital Stay (HOSPITAL_COMMUNITY): Payer: BLUE CROSS/BLUE SHIELD

## 2018-05-31 MED ORDER — OXYCODONE HCL 5 MG PO TABS: ORAL_TABLET | ORAL | 0 refills | Status: DC

## 2018-05-31 NOTE — Progress Notes (Signed)
      [address]       [place], [address]             [phone]       8 Days Post-Op Procedure(s) (LRB): RIGHT UPPER LOBE VIDEO ASSISTED THORACOSCOPY (VATS) FOR BIOPSY (Right)  Subjective: Patient without specific complaint this am. She is dressed and "ready to go home".  Objective: Vital signs in last 24 hours: Temp:  [97.5 F (36.4 C)-98.8 F (37.1 C)] 98.2 F (36.8 C) (12/17 0733) Pulse Rate:  [94-112] 112 (12/17 0733) Cardiac Rhythm: Normal sinus rhythm (12/16 2100) Resp:  [18-21] 18 (12/17 0733) BP: (108-128)/(67-87) 128/87 (12/17 0733) SpO2:  [95 %-98 %] 96 % (12/17 0733)     Intake/Output from previous day: No intake/output data recorded.   Physical Exam:  Cardiovascular: RRR Pulmonary: Mostly clear bilaterally Abdomen: Soft, non tender, bowel sounds present. Extremities:No lower extremity edema. Wounds: Clean and dry.  No erythema or signs of infection. Trace serous drainage from chest tube wound.  Lab Results: CBC: No results for input(s): WBC, HGB, HCT, PLT in the last 72 hours. BMET:  Recent Labs    05/29/18 1335  NA 135  K 3.1*  CL 86*  CO2 33*  GLUCOSE 131*  BUN 9  CREATININE 0.90  CALCIUM 9.1    PT/INR: No results for input(s): LABPROT, INR in the last 72 hours. ABG:  INR: Will add last result for INR, ABG once components are confirmed Will add last 4 CBG results once components are confirmed  Assessment/Plan: 1. CV-Tachycardic in the low 100's. On Toprol XL 25 mg (as was on prior to surgery). Tachycardia is improving and may be related to being intravascularly dry as was given Lasix for several days.  2. Pulmonary- Chest tube was removed yesterday. CXR this am appears stable (some subcutaneous emphysema right lateral chest wall, tiny right apical pneumothorax). On room air. Encourage incentive spirometer. 3. ABL anemia-Last H and H slightly decreased to 10.7 and 33.9 4. As discussed with Dr. Prescott Gum, will  discharge home  [facility] Gilbert ZimmermanPA-C 05/31/2018,8:47 AM [phone]

## 2018-05-31 NOTE — Care Management Note (Signed)
Case Management Note  Patient Details  Name: Sarah Weeks MRN: [id number] Date of Birth: May 01, 1956  Subjective/Objective:    Pt is s/p VATS                Action/Plan:   PTA independent from home, pt has PCP and denied barriers with paying for medications as prescribed.  NO CM Needs determined   Expected Discharge Date:  05/31/18               Expected Discharge Plan:  Home/Self Care  In-House Referral:     Discharge planning Services  CM Consult  Post Acute Care Choice:    Choice offered to:     DME Arranged:    DME Agency:     HH Arranged:    HH Agency:     Status of Service:  Completed, signed off  If discussed at [employer] of Stay Meetings, dates discussed:    Additional Comments:  Maryclare Labrador, RN 05/31/2018, 9:27 AM

## 2018-06-01 ENCOUNTER — Other Ambulatory Visit: Payer: Self-pay | Admitting: Family Medicine

## 2018-06-02 ENCOUNTER — Telehealth: Payer: Self-pay

## 2018-06-02 NOTE — Telephone Encounter (Signed)
Copied from [place] [phone]. Topic: General - Other >> Jun 02, 2018  3:38 PM Yvette Rack wrote: Reason for CRM: Pt stated she needs to speak with Dr. Deborra Medina assistant Sharyn Lull. Pt requests a call back from [place]. Cb# [phone]

## 2018-06-06 ENCOUNTER — Other Ambulatory Visit: Payer: Self-pay

## 2018-06-06 MED ORDER — SERTRALINE HCL 50 MG PO TABS: 50.0000 mg | ORAL_TABLET | Freq: Every day | ORAL | 1 refills | Status: AC

## 2018-06-06 MED ORDER — QUETIAPINE FUMARATE 25 MG PO TABS: ORAL_TABLET | ORAL | 1 refills | Status: AC

## 2018-06-06 MED ORDER — METOPROLOL SUCCINATE ER 25 MG PO TB24: 25.0000 mg | ORAL_TABLET | Freq: Every day | ORAL | 1 refills | Status: DC

## 2018-06-06 MED ORDER — ROSUVASTATIN CALCIUM 5 MG PO TABS: 5.0000 mg | ORAL_TABLET | Freq: Every day | ORAL | 1 refills | Status: AC

## 2018-06-06 MED ORDER — ALPRAZOLAM 0.5 MG PO TABS: ORAL_TABLET | ORAL | 1 refills | Status: AC

## 2018-06-06 MED ORDER — PANTOPRAZOLE SODIUM 40 MG PO TBEC: 40.0000 mg | DELAYED_RELEASE_TABLET | Freq: Every day | ORAL | 1 refills | Status: AC

## 2018-06-06 MED ORDER — ALBUTEROL SULFATE HFA 108 (90 BASE) MCG/ACT IN AERS: 2.0000 | INHALATION_SPRAY | Freq: Four times a day (QID) | RESPIRATORY_TRACT | 3 refills | Status: AC | PRN

## 2018-06-06 NOTE — Telephone Encounter (Signed)
Addendum:  TA- I have sent in refills for everything that you prescribe except for the Alprazolam/I have it ready to approve/PMP ok no red flags/plz advise/ on previous note/thx dmf

## 2018-06-06 NOTE — Telephone Encounter (Signed)
I really don't know the cardiologists at [facility].  I am so sorry I can't be more helpful.  And yes she can have a 90 day supply and I will send in the xanax now.  How is she feeling?  Is she recovering well from surgery? I've been thinking about her so much.  Please wish her a Happy Holiday for me as well.

## 2018-06-06 NOTE — Telephone Encounter (Signed)
Copied from [place] [phone]. Topic: General - Other >> Jun 02, 2018  3:38 PM Yvette Rack wrote: Reason for CRM: Pt stated she needs to speak with Dr. Deborra Medina assistant Sharyn Lull. Pt requests a call back from [place]. Cb# [phone] >> Jun 06, 2018 11:57 AM Yvette Rack wrote: Pt called once again stating she really needs to speak with Dr. Hulen Shouts assistant Sharyn Lull. Pt requests a call back. Cb# [phone]

## 2018-06-06 NOTE — Addendum Note (Signed)
Addended by: Marrion Coy on: 06/06/2018 04:04 PM   Modules accepted: Orders

## 2018-06-06 NOTE — Telephone Encounter (Signed)
TA-Pt states that with the new year you and her Cardiologist are not under the plan/she is requesting a 90d Rx on all her prescription to be sent in to last for her until she can establish care with someone else  Would like to know if you recommend anyone at [facility]? Plz advise/thx dmf

## 2018-06-06 NOTE — Addendum Note (Signed)
Addended by: Lucille Passy on: 06/06/2018 06:21 PM   Modules accepted: Orders

## 2018-06-06 NOTE — Telephone Encounter (Signed)
Duplicate, see other phone note.

## 2018-06-14 ENCOUNTER — Other Ambulatory Visit: Payer: Self-pay | Admitting: Cardiothoracic Surgery

## 2018-06-14 DIAGNOSIS — R918 Other nonspecific abnormal finding of lung field: Secondary | ICD-10-CM

## 2018-06-16 ENCOUNTER — Other Ambulatory Visit: Payer: Self-pay

## 2018-06-16 ENCOUNTER — Ambulatory Visit
Admission: RE | Admit: 2018-06-16 | Discharge: 2018-06-16 | Disposition: A | Discharge status: Home / Self Care | Payer: BLUE CROSS/BLUE SHIELD | Source: Ambulatory Visit | Attending: Cardiothoracic Surgery | Admitting: Cardiothoracic Surgery

## 2018-06-16 ENCOUNTER — Ambulatory Visit (INDEPENDENT_AMBULATORY_CARE_PROVIDER_SITE_OTHER): Payer: Self-pay | Admitting: Cardiothoracic Surgery

## 2018-06-16 ENCOUNTER — Encounter: Payer: Self-pay | Admitting: Cardiothoracic Surgery

## 2018-06-16 VITALS — BP 180/90 | HR 91 | Resp 16 | Ht 62.0 in | Wt 166.0 lb

## 2018-06-16 DIAGNOSIS — R918 Other nonspecific abnormal finding of lung field: Secondary | ICD-10-CM

## 2018-06-16 DIAGNOSIS — C3411 Malignant neoplasm of upper lobe, right bronchus or lung: Secondary | ICD-10-CM

## 2018-06-16 DIAGNOSIS — Z09 Encounter for follow-up examination after completed treatment for conditions other than malignant neoplasm: Secondary | ICD-10-CM

## 2018-06-16 MED ORDER — METOPROLOL SUCCINATE 12.5 MG HALF TABLET: 50.0000 mg | ORAL_TABLET | Freq: Every day | ORAL | Status: AC

## 2018-06-16 MED ORDER — FUROSEMIDE 40 MG PO TABS: 40.0000 mg | ORAL_TABLET | Freq: Every day | ORAL | 0 refills | Status: AC

## 2018-06-16 NOTE — Progress Notes (Signed)
PCP is Lucille Passy, MD Referring Provider is Lucille Passy, MD  Chief Complaint  Patient presents with  . Routine Post Op    f/u s/p R VATS/RUL BX with WEDGE RESECTION...05/23/18...with a CXR...continues with weight gain and ankle swelling that was a coomplaint pre op    HPI: Patient returns for scheduled two-week postop visit with chest x-ray.  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  She had a wedge resection because of severe underlying COPD.  She has done well after surgery with minimal postsurgical pain.  She does have some dyspnea on exertion which resolves with rest.  Room air saturation is 93%.  Her postop chest x-ray shows no significant pneumothorax with expected postoperative changes at the staple lines. Pathologic stage is T1c, N0 M0.  There was a 1 cm margin.  After the patient recovers further from the surgery she will be referred for oncology evaluation.  I have requested pathology to perform mutation analysis-foundation 1.  Patient's main complaint is left ankle pain and swelling which is a chronic problem related to previous injury and arthritis.  She receives steroid injections by orthopedic surgery intermittently and will need to wait until after February 1 to allow proper healing of her thoracotomy.  The patient's blood pressure is elevated today and I will increase her dose of Toprol-XL.  Her left ankle edema has been a problem and she will be prescribed a course of oral Lasix.  The ankle problem has been chronic and is not an osteoarthropathy from the lung cancer. Past Medical History:  Diagnosis Date  . Anxiety   . Arthritis   . Breast cancer ([place]) 2004   LT LUMPECTOMY  . Cancer [facility]) 2004   breast cancer left  . GERD (gastroesophageal reflux disease)   . Hay fever   . Hematuria    gross  . History of kidney stones   . HTN (hypertension) 02/01/2017  . Personal history of chemotherapy 2003   BREAST CA  . Personal history of radiation therapy 2003   BREAST CA  . S/P chemotherapy, time since greater than 12 weeks    left 2003  . S/P radiation therapy 2003   left breast cancer  . Shortness of breath dyspnea     Past Surgical History:  Procedure Laterality Date  . ANKLE FUSION Right 2000  . BREAST EXCISIONAL BIOPSY Left 2003   positive  . BREAST LUMPECTOMY  2002   L side  . COLONOSCOPY    . CYSTOSCOPY WITH STENT PLACEMENT Right 03/23/2016   Procedure: CYSTOSCOPY WITH STENT PLACEMENT;  Surgeon: Hollice Espy, MD;  Location: [facility] ORS;  Service: Urology;  Laterality: Right;  . DENTAL SURGERY     implants  . ESOPHAGOGASTRODUODENOSCOPY    . URETEROSCOPY WITH HOLMIUM LASER LITHOTRIPSY Right 03/23/2016   Procedure: URETEROSCOPY WITH HOLMIUM LASER LITHOTRIPSY;  Surgeon: Hollice Espy, MD;  Location: [facility] ORS;  Service: Urology;  Laterality: Right;  Marland Kitchen VIDEO ASSISTED THORACOSCOPY (VATS)/ LOBECTOMY Right 05/23/2018   Procedure: RIGHT UPPER LOBE VIDEO ASSISTED THORACOSCOPY (VATS) FOR BIOPSY;  Surgeon: Ivin Poot, MD;  Location: [facility];  Service: Thoracic;  Laterality: Right;    Family History  Problem Relation Age of Onset  . Cancer Mother   . Breast cancer Mother 72  . Alcohol abuse Father   . Heart disease Father   . Hypertension Father   . Cancer Sister   . Heart disease Sister   .  Breast cancer Sister 65  . Heart disease Brother   . Stroke Maternal Grandfather   . Diabetes Maternal Grandfather   . Kidney cancer Neg Hx   . Kidney disease Neg Hx   . Prostate cancer Neg Hx   . Bladder Cancer Neg Hx     Social History Social History   Tobacco Use  . Smoking status: Former Smoker    Packs/day: 1.00    Years: 43.00    Pack years: 43.00    Types: Cigarettes    Last attempt to quit: 03/12/2013    Years since quitting: 5.2  . Smokeless tobacco: Never Used  Substance Use Topics  . Alcohol use: Yes    Alcohol/week: 14.0 standard drinks    Types: 14 Shots of  liquor per week    Comment: daily drinker  . Drug use: No    Current Outpatient Medications  Medication Sig Dispense Refill  . albuterol (PROVENTIL HFA;VENTOLIN HFA) 108 (90 Base) MCG/ACT inhaler Inhale 2 puffs into the lungs every 6 (six) hours as needed for wheezing or shortness of breath. 18 g 3  . ALPRAZolam (XANAX) 0.5 MG tablet TAKE 1/2 TABLET(0.25 MG) BY MOUTH TWICE DAILY AS NEEDED FOR ANXIETY OR SLEEP 180 tablet 1  . diphenhydrAMINE (BENADRYL) 25 mg capsule Take 25 mg by mouth daily as needed for allergies.     Marland Kitchen ibuprofen (ADVIL,MOTRIN) 200 MG tablet Take 800 mg by mouth every 8 (eight) hours as needed (pain).    Marland Kitchen loperamide (IMODIUM A-D) 2 MG tablet Take 2 mg 4 (four) times daily as needed by mouth for diarrhea or loose stools.    . metoprolol succinate (TOPROL-XL) 25 MG 24 hr tablet Take 1 tablet (25 mg total) by mouth daily. 90 tablet 1  . Multiple Vitamin (MULTIVITAMIN) tablet Take 2 tablets by mouth daily. Reported on 11/27/2015    . pantoprazole (PROTONIX) 40 MG tablet Take 1 tablet (40 mg total) by mouth daily. 90 tablet 1  . QUEtiapine (SEROQUEL) 25 MG tablet TAKE 1 TABLET(25 MG) BY MOUTH AT BEDTIME 90 tablet 1  . rosuvastatin (CRESTOR) 5 MG tablet Take 1 tablet (5 mg total) by mouth daily. 90 tablet 1  . sertraline (ZOLOFT) 50 MG tablet Take 1 tablet (50 mg total) by mouth daily. 90 tablet 1   No current facility-administered medications for this visit.     Allergies  Allergen Reactions  . Doxycycline     Caused major sun sensitivity.    Review of Systems  Patient able to get back to her normal activities and job  BP (!) 180/90 (BP Location: Right Arm, Patient Position: Sitting, Cuff Size: Large) Comment: STATES SHE HAS PAIN IN HER ANKLES  Pulse 91   Resp 16   Ht 5\' 2"  (1.575 m)   Wt 166 lb (75.3 kg)   SpO2 93% Comment: ON RA  BMI 30.36 kg/m  Physical Exam Alert and comfortable Right VATS incision well-healed Lungs clear Heart rate regular Left ankle with  2+ edema No tenderness over the tibia  Diagnostic Tests: Chest x-ray image personally reviewed shows no pneumothorax and some postoperative changes after resection of right upper lobe adenocarcinoma  Impression: Patient progressing. She needs blood pressure control improved She needs a course of oral Lasix for lower extremity edema I will see her back in 1 month with chest x-ray and discuss referral to thoracic oncology clinic for possible adjunctive chemotherapy.  By then the foundation 1 results should be available.  Plan: Return in  1 month with chest x-ray  Len Childs, MD [facility] [phone]

## 2018-07-04 ENCOUNTER — Encounter (HOSPITAL_COMMUNITY): Payer: Self-pay | Admitting: Cardiothoracic Surgery

## 2018-07-19 ENCOUNTER — Other Ambulatory Visit: Payer: Self-pay | Admitting: Cardiothoracic Surgery

## 2018-07-19 DIAGNOSIS — R918 Other nonspecific abnormal finding of lung field: Secondary | ICD-10-CM

## 2018-07-20 ENCOUNTER — Encounter: Payer: Self-pay | Admitting: Cardiothoracic Surgery

## 2018-07-20 ENCOUNTER — Other Ambulatory Visit: Payer: Self-pay

## 2018-07-20 ENCOUNTER — Ambulatory Visit (INDEPENDENT_AMBULATORY_CARE_PROVIDER_SITE_OTHER): Payer: Self-pay | Admitting: Cardiothoracic Surgery

## 2018-07-20 ENCOUNTER — Ambulatory Visit
Admission: RE | Admit: 2018-07-20 | Discharge: 2018-07-20 | Disposition: A | Discharge status: Home / Self Care | Payer: Self-pay | Source: Ambulatory Visit | Attending: Cardiothoracic Surgery | Admitting: Cardiothoracic Surgery

## 2018-07-20 VITALS — BP 116/74 | HR 77 | Resp 16 | Ht 62.0 in | Wt 166.0 lb

## 2018-07-20 DIAGNOSIS — Z09 Encounter for follow-up examination after completed treatment for conditions other than malignant neoplasm: Secondary | ICD-10-CM

## 2018-07-20 DIAGNOSIS — R918 Other nonspecific abnormal finding of lung field: Secondary | ICD-10-CM

## 2018-07-20 DIAGNOSIS — C3411 Malignant neoplasm of upper lobe, right bronchus or lung: Secondary | ICD-10-CM

## 2018-07-20 NOTE — Progress Notes (Signed)
PCP is Rikki Spearing, NP Referring Provider is Lucille Passy, MD  Chief Complaint  Patient presents with  . Routine Post Op    1 month f/u with CXR     HPI: 20-monthpostop visit after right VATS, right upper lobe wedge resection of 2.8 cm adenocarcinoma T1c N0.  Foundation 1 markers negative for EGFR.  Patient progressing slowly but mainly complains of fatigue.  No shortness of breath or surgical pain.  Chest x-ray today is clear without pneumothorax or pleural effusion.  Patient has changed her insurance company as of January 1 and now [facility]  She will be referred to medical oncologist Dr. WMichell Heinrich  Past Medical History:  Diagnosis Date  . Anxiety   . Arthritis   . [place]   LT LUMPECTOMY  . Cancer ([facility] 2004   breast cancer left  . GERD (gastroesophageal reflux disease)   . Hay fever   . Hematuria    gross  . History of kidney stones   . HTN (hypertension) 02/01/2017  . Personal history of chemotherapy 2003   BREAST CA  . Personal history of radiation therapy 2003   BREAST CA  . S/P chemotherapy, time since greater than 12 weeks    left 2003  . S/P radiation therapy 2003   left breast cancer  . Shortness of breath dyspnea     Past Surgical History:  Procedure Laterality Date  . ANKLE FUSION Right 2000  . BREAST EXCISIONAL BIOPSY Left 2003   positive  . BREAST LUMPECTOMY  2002   L side  . COLONOSCOPY    . CYSTOSCOPY WITH STENT PLACEMENT Right 03/23/2016   Procedure: CYSTOSCOPY WITH STENT PLACEMENT;  Surgeon: AHollice Espy MD;  Location: [facility] ORS;  Service: Urology;  Laterality: Right;  . DENTAL SURGERY     implants  . ESOPHAGOGASTRODUODENOSCOPY    . URETEROSCOPY WITH HOLMIUM LASER LITHOTRIPSY Right 03/23/2016   Procedure: URETEROSCOPY WITH HOLMIUM LASER LITHOTRIPSY;  Surgeon: AHollice Espy MD;  Location: [facility] ORS;  Service: Urology;  Laterality: Right;  .Marland Kitchen VIDEO ASSISTED THORACOSCOPY (VATS)/ LOBECTOMY Right 05/23/2018   Procedure: RIGHT UPPER LOBE VIDEO ASSISTED THORACOSCOPY (VATS) FOR BIOPSY;  Surgeon: VIvin Poot MD;  [facility];  Service: Thoracic;  Laterality: Right;    Family History  Problem Relation Age of Onset  . Cancer Mother   . Breast cancer Mother 626 . Alcohol abuse Father   . Heart disease Father   . Hypertension Father   . Cancer Sister   . Heart disease Sister   . Breast cancer Sister 520 . Heart disease Brother   . Stroke Maternal Grandfather   . Diabetes Maternal Grandfather   . Kidney cancer Neg Hx   . Kidney disease Neg Hx   . Prostate cancer Neg Hx   . Bladder Cancer Neg Hx     Social History Social History   Tobacco Use  . Smoking status: Former Smoker    Packs/day: 1.00    Years: 43.00    Pack years: 43.00    Types: Cigarettes    Last attempt to quit: 03/12/2013    Years since quitting: 5.3  . Smokeless tobacco: Never Used  Substance Use Topics  . Alcohol use: Yes    Alcohol/week: 14.0 standard drinks    Types: 14 Shots of liquor per week    Comment: daily drinker  .  Drug use: No    Current Outpatient Medications  Medication Sig Dispense Refill  . albuterol (PROVENTIL HFA;VENTOLIN HFA) 108 (90 Base) MCG/ACT inhaler Inhale 2 puffs into the lungs every 6 (six) hours as needed for wheezing or shortness of breath. 18 g 3  . ALPRAZolam (XANAX) 0.5 MG tablet TAKE 1/2 TABLET(0.25 MG) BY MOUTH TWICE DAILY AS NEEDED FOR ANXIETY OR SLEEP 180 tablet 1  . diphenhydrAMINE (BENADRYL) 25 mg capsule Take 25 mg by mouth daily as needed for allergies.     . furosemide (LASIX) 40 MG tablet Take 1 tablet (40 mg total) by mouth daily. 14 tablet 0  . ibuprofen (ADVIL,MOTRIN) 200 MG tablet Take 800 mg by mouth every 8 (eight) hours as needed (pain).    Marland Kitchen loperamide (IMODIUM A-D) 2 MG tablet Take 2 mg 4 (four) times daily as needed by mouth for diarrhea or loose stools.    . Multiple Vitamin (MULTIVITAMIN)  tablet Take 2 tablets by mouth daily. Reported on 11/27/2015    . pantoprazole (PROTONIX) 40 MG tablet Take 1 tablet (40 mg total) by mouth daily. 90 tablet 1  . QUEtiapine (SEROQUEL) 25 MG tablet TAKE 1 TABLET(25 MG) BY MOUTH AT BEDTIME 90 tablet 1  . rosuvastatin (CRESTOR) 5 MG tablet Take 1 tablet (5 mg total) by mouth daily. 90 tablet 1  . sertraline (ZOLOFT) 50 MG tablet Take 1 tablet (50 mg total) by mouth daily. 90 tablet 1   Current Facility-Administered Medications  Medication Dose Route Frequency Provider Last Rate Last Dose  . metoprolol succinate (TOPROL-XL) 24 hr tablet 50 mg  50 mg Oral Daily Ivin Poot, MD        Allergies  Allergen Reactions  . Doxycycline     Caused major sun sensitivity.    Review of Systems  Patient not smoking Ankle swelling better after lung resection Incision healing well  BP 116/74 (BP Location: Right Arm, Patient Position: Sitting, Cuff Size: Large)   Pulse 77   Resp 16   Ht '5\' 2"'$  (1.575 m)   Wt 166 lb (75.3 kg)   SpO2 94% Comment: RA  BMI 30.36 kg/m  Physical Exam Alert and comfortable Breath sounds clear and equal Right VATS incision well-healed No ankle edema  Diagnostic Tests: Chest x-ray today is clear  Impression: Patient recovering fairly well after right VATS, resection of a stage I adenocarcinoma, EGFR negative.  Patient will refer to medical oncologist at [facility] because of [place] network requirements.  Plan: I have provided the patient with hard copies of her pathology report, surgical notes, and consultation.   Len Childs, MD [facility] [phone]

## 2018-07-25 ENCOUNTER — Ambulatory Visit: Payer: No Typology Code available for payment source | Admitting: Internal Medicine

## 2018-07-25 ENCOUNTER — Ambulatory Visit: Payer: BLUE CROSS/BLUE SHIELD | Admitting: Internal Medicine

## 2018-11-11 ENCOUNTER — Emergency Department: Payer: BLUE CROSS/BLUE SHIELD

## 2018-11-11 ENCOUNTER — Encounter: Payer: Self-pay | Admitting: Emergency Medicine

## 2018-11-11 ENCOUNTER — Emergency Department
Admission: EM | Admit: 2018-11-11 | Discharge: 2018-11-11 | Discharge status: Against Medical Advice | Payer: BLUE CROSS/BLUE SHIELD | Attending: Emergency Medicine | Admitting: Emergency Medicine

## 2018-11-11 ENCOUNTER — Other Ambulatory Visit: Payer: Self-pay

## 2018-11-11 DIAGNOSIS — Z87891 Personal history of nicotine dependence: Secondary | ICD-10-CM | POA: Insufficient documentation

## 2018-11-11 DIAGNOSIS — I10 Essential (primary) hypertension: Secondary | ICD-10-CM | POA: Insufficient documentation

## 2018-11-11 DIAGNOSIS — Z79899 Other long term (current) drug therapy: Secondary | ICD-10-CM | POA: Diagnosis not present

## 2018-11-11 DIAGNOSIS — Z5320 Procedure and treatment not carried out because of patient's decision for unspecified reasons: Secondary | ICD-10-CM | POA: Diagnosis not present

## 2018-11-11 DIAGNOSIS — R443 Hallucinations, unspecified: Secondary | ICD-10-CM | POA: Diagnosis not present

## 2018-11-11 DIAGNOSIS — R4182 Altered mental status, unspecified: Secondary | ICD-10-CM | POA: Diagnosis present

## 2018-11-11 LAB — CBC
HCT: 32.5 % — ABNORMAL LOW (ref 36.0–46.0)
Hemoglobin: 10.2 g/dL — ABNORMAL LOW (ref 12.0–15.0)
MCH: 35.7 pg — ABNORMAL HIGH (ref 26.0–34.0)
MCHC: 31.4 g/dL (ref 30.0–36.0)
MCV: 113.6 fL — ABNORMAL HIGH (ref 80.0–100.0)
Platelets: 291 10*3/uL (ref 150–400)
RBC: 2.86 MIL/uL — ABNORMAL LOW (ref 3.87–5.11)
RDW: 14.2 % (ref 11.5–15.5)
WBC: 7.2 10*3/uL (ref 4.0–10.5)
nRBC: 0 % (ref 0.0–0.2)

## 2018-11-11 LAB — COMPREHENSIVE METABOLIC PANEL
ALT: 38 U/L (ref 0–44)
AST: 44 U/L — ABNORMAL HIGH (ref 15–41)
Albumin: 3.8 g/dL (ref 3.5–5.0)
Alkaline Phosphatase: 135 U/L — ABNORMAL HIGH (ref 38–126)
Anion gap: 10 (ref 5–15)
BUN: 14 mg/dL (ref 8–23)
CO2: 24 mmol/L (ref 22–32)
Calcium: 9.5 mg/dL (ref 8.9–10.3)
Chloride: 105 mmol/L (ref 98–111)
Creatinine, Ser: 0.79 mg/dL (ref 0.44–1.00)
GFR calc Af Amer: >60 mL/min (ref >60)
GFR calc non Af Amer: >60 mL/min (ref >60)
Glucose, Bld: 116 mg/dL — ABNORMAL HIGH (ref 70–99)
Potassium: 3.1 mmol/L — ABNORMAL LOW (ref 3.5–5.1)
Sodium: 139 mmol/L (ref 135–145)
Total Bilirubin: 0.6 mg/dL (ref 0.3–1.2)
Total Protein: 7.1 g/dL (ref 6.5–8.1)

## 2018-11-11 LAB — GLUCOSE, CAPILLARY: Glucose-Capillary: 105 mg/dL — ABNORMAL HIGH (ref 70–99)

## 2018-11-11 NOTE — ED Notes (Signed)
Visitor to stat desk asking about wait time. Visitor given update on wait time. Visitor verbalizes understanding.

## 2018-11-11 NOTE — ED Notes (Signed)
Patient delusional at times, reaching for things in the air. At times appears coherent, then begins to have word salads. Son in room reports  This is not her norm. Son concerned about new behavior. Awaiting md eval.

## 2018-11-11 NOTE — ED Provider Notes (Signed)
[facility] Emergency Department Provider Note   ____________________________________________    I have reviewed the triage vital signs and the nursing notes.   HISTORY  Chief Complaint Altered Mental Status     HPI Sarah Weeks is a 63 y.o. female brought in by son for evaluation of possible hallucinations.  Son reports that patient's mother died who lived with her approximately 8 months ago and patient has been living alone.  He reports that he has spoken with her primarily on the phone during that time it is noted that she has been somewhat odd occasionally but attributed to possible alcohol use.  Recently it is been uncovered that the patient appears to be hallucinating and is demonstrating paranoia, she believes that someone is living in her garage apartment and apparently is seeing things that are not there per son.  No SI or HI.  No history of psychiatric disorder  Past Medical History:  Diagnosis Date  . Anxiety   . Arthritis   . Breast cancer ([place]) 2004   LT LUMPECTOMY  . Cancer [facility]) 2004   breast cancer left  . GERD (gastroesophageal reflux disease)   . Hay fever   . Hematuria    gross  . History of kidney stones   . HTN (hypertension) 02/01/2017  . Personal history of chemotherapy 2003   BREAST CA  . Personal history of radiation therapy 2003   BREAST CA  . S/P chemotherapy, time since greater than 12 weeks    left 2003  . S/P radiation therapy 2003   left breast cancer  . Shortness of breath dyspnea     Patient Active Problem List   Diagnosis Date Noted  . Mass of upper lobe of right lung 05/23/2018  . Edema 04/13/2018  . Dog bite 03/31/2018  . Nose discharge, purulent 03/31/2018  . HLD (hyperlipidemia) 03/09/2018  . Grief 02/24/2018  . Carotid artery calcification 12/28/2017  . Arthritis 08/17/2017  . GERD (gastroesophageal reflux disease) 08/17/2017  . HTN (hypertension) 02/01/2017  . Tachycardia 01/18/2017  .  Pain in joint, ankle and foot 04/01/2016  . Cobalamin deficiency 01/14/2015  . Alopecia 12/03/2014  . Inflammation of sacroiliac joint ([place]) 09/18/2014  . 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 Gastric ulcer 11/23/2013  . Insomnia 10/26/2013  . Family history of coronary artery disease 08/17/2013  . Anxiety and depression 08/17/2013    Past Surgical History:  Procedure Laterality Date  . ANKLE FUSION Right 2000  . BREAST EXCISIONAL BIOPSY Left 2003   positive  . BREAST LUMPECTOMY  2002   L side  . COLONOSCOPY    . CYSTOSCOPY WITH STENT PLACEMENT Right 03/23/2016   Procedure: CYSTOSCOPY WITH STENT PLACEMENT;  Surgeon: Hollice Espy, MD;  Location: [facility] ORS;  Service: Urology;  Laterality: Right;  . DENTAL SURGERY     implants  . ESOPHAGOGASTRODUODENOSCOPY    . URETEROSCOPY WITH HOLMIUM LASER LITHOTRIPSY Right 03/23/2016   Procedure: URETEROSCOPY WITH HOLMIUM LASER LITHOTRIPSY;  Surgeon: Hollice Espy, MD;  Location: [facility] ORS;  Service: Urology;  Laterality: Right;  Marland Kitchen VIDEO ASSISTED THORACOSCOPY (VATS)/ LOBECTOMY Right 05/23/2018   Procedure: RIGHT UPPER LOBE VIDEO ASSISTED THORACOSCOPY (VATS) FOR BIOPSY;  Surgeon: Ivin Poot, MD;  Location: [facility];  Service: Thoracic;  Laterality: Right;    Prior to Admission medications   Medication Sig Start Date End Date Taking? Authorizing Provider  albuterol (PROVENTIL HFA;VENTOLIN HFA) 108 (90 Base) MCG/ACT inhaler Inhale 2 puffs  into the lungs every 6 (six) hours as needed for wheezing or shortness of breath. 06/06/18   Lucille Passy, MD  ALPRAZolam Duanne Moron) 0.5 MG tablet TAKE 1/2 TABLET(0.25 MG) BY MOUTH TWICE DAILY AS NEEDED FOR ANXIETY OR SLEEP 06/06/18   Lucille Passy, MD  diphenhydrAMINE (BENADRYL) 25 mg capsule Take 25 mg by mouth daily as needed for allergies.     [provider]  furosemide (LASIX) 40 MG tablet Take 1 tablet (40 mg total) by mouth daily. 06/16/18   Ivin Poot, MD  ibuprofen  (ADVIL,MOTRIN) 200 MG tablet Take 800 mg by mouth every 8 (eight) hours as needed (pain).    [provider]  loperamide (IMODIUM A-D) 2 MG tablet Take 2 mg 4 (four) times daily as needed by mouth for diarrhea or loose stools.    [provider]  Multiple Vitamin (MULTIVITAMIN) tablet Take 2 tablets by mouth daily. Reported on 11/27/2015    [provider]  pantoprazole (PROTONIX) 40 MG tablet Take 1 tablet (40 mg total) by mouth daily. 06/06/18   Lucille Passy, MD  QUEtiapine (SEROQUEL) 25 MG tablet TAKE 1 TABLET(25 MG) BY MOUTH AT BEDTIME 06/06/18   Lucille Passy, MD  rosuvastatin (CRESTOR) 5 MG tablet Take 1 tablet (5 mg total) by mouth daily. 06/06/18   Lucille Passy, MD  sertraline (ZOLOFT) 50 MG tablet Take 1 tablet (50 mg total) by mouth daily. 06/06/18   Lucille Passy, MD     Allergies Doxycycline  Family History  Problem Relation Age of Onset  . Cancer Mother   . Breast cancer Mother 52  . Alcohol abuse Father   . Heart disease Father   . Hypertension Father   . Cancer Sister   . Heart disease Sister   . Breast cancer Sister 23  . Heart disease Brother   . Stroke Maternal Grandfather   . Diabetes Maternal Grandfather   . Kidney cancer Neg Hx   . Kidney disease Neg Hx   . Prostate cancer Neg Hx   . Bladder Cancer Neg Hx     Social History Social History   Tobacco Use  . Smoking status: Former Smoker    Packs/day: 1.00    Years: 43.00    Pack years: 43.00    Types: Cigarettes    Last attempt to quit: 03/12/2013    Years since quitting: 5.6  . Smokeless tobacco: Never Used  Substance Use Topics  . Alcohol use: Yes    Alcohol/week: 14.0 standard drinks    Types: 14 Shots of liquor per week    Comment: daily drinker  . Drug use: No    Review of Systems  Constitutional: No fever/chills Eyes: No visual changes.  ENT: No sore throat. Cardiovascular: Denies chest pain. Respiratory: Denies shortness of breath. Gastrointestinal: No  abdominal pain.   Genitourinary: Negative for dysuria. Musculoskeletal: Negative for back pain. Skin: Negative for rash. Neurological: Negative for headaches   ____________________________________________   PHYSICAL EXAM:  VITAL SIGNS: ED Triage Vitals  Enc Vitals Group     BP 11/11/18 2041 (!) 129/101     Pulse Rate 11/11/18 2041 (!) 108     Resp 11/11/18 2041 (!) 22     Temp 11/11/18 2041 98.3 F (36.8 C)     Temp Source 11/11/18 2041 Oral     SpO2 11/11/18 2041 100 %     Weight 11/11/18 1705 68 kg (150 lb)     Height 11/11/18  1705 1.575 m (5\' 2" )     Head Circumference --      Peak Flow --      Pain Score 11/11/18 1705 0     Pain Loc --      Pain Edu? --      Excl. in [place]? --     Constitutional: Alert and oriented.  Eyes: Conjunctivae are normal.  Head: Bruising around the orbits, old Nose: No epistaxis Mouth/Throat: Mucous membranes are moist.   Neck:  Painless ROM Cardiovascular: Normal rate, regular rhythm.   Good peripheral circulation. Respiratory: Normal respiratory effort.  No retractions.. Gastrointestinal: Soft and nontender. No distention.  .  Musculoskeletal: No lower extremity tenderness nor edema.  Warm and well perfused Neurologic:  Normal speech and language. No gross focal neurologic deficits are appreciated.  Skin:  Skin is warm, dry and intact. No rash noted. Psychiatric: Mood and affect are normal. Speech and behavior are normal.  ____________________________________________   LABS (all labs ordered are listed, but only abnormal results are displayed)  Labs Reviewed  COMPREHENSIVE METABOLIC PANEL - Abnormal; Notable for the following components:      Result Value   Potassium 3.1 (*)    Glucose, Bld 116 (*)    AST 44 (*)    Alkaline Phosphatase 135 (*)    All other components within normal limits  CBC - Abnormal; Notable for the following components:   RBC 2.86 (*)    Hemoglobin 10.2 (*)    HCT 32.5 (*)    MCV 113.6 (*)    MCH 35.7  (*)    All other components within normal limits  GLUCOSE, CAPILLARY - Abnormal; Notable for the following components:   Glucose-Capillary 105 (*)    All other components within normal limits  URINALYSIS, COMPLETE (UACMP) WITH MICROSCOPIC   ____________________________________________  EKG  None ____________________________________________  RADIOLOGY  CT head unremarkable ____________________________________________   PROCEDURES  Procedure(s) performed: No  Procedures   Critical Care performed: No ____________________________________________   INITIAL IMPRESSION / ASSESSMENT AND PLAN / ED COURSE  Pertinent labs & imaging results that were available during my care of the patient were reviewed by me and considered in my medical decision making (see chart for details).  Patient presents with what sounds like visual and possible auditory hallucinations with some paranoia.  Lab work, imaging is reassuring, afebrile.  I suspect gradual onset psychiatric disease, likely multifactorial.  The patient does not appear to be a danger to herself or others, no indication for IVC.  We will consult tele-psychiatry for evaluation possible medication.  Pending urinalysis   ____________________________________________   FINAL CLINICAL IMPRESSION(S) / ED DIAGNOSES  Final diagnoses:  Hallucinations        Note:  This document was prepared using Dragon voice recognition software and may include unintentional dictation errors.   Lavonia Drafts, MD 11/11/18 2249
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
## 2018-11-11 NOTE — ED Notes (Signed)
Psych called patient and son not in room. Md aware son left ER with his mother. Did not wait for consult.

## 2018-11-11 NOTE — ED Notes (Signed)
Patient awaiting psych consult.

## 2018-11-13 ENCOUNTER — Emergency Department (HOSPITAL_COMMUNITY): Payer: BLUE CROSS/BLUE SHIELD

## 2018-11-13 ENCOUNTER — Encounter (HOSPITAL_COMMUNITY): Admission: EM | Disposition: A | Discharge status: Rehabilitation Facility | Payer: Self-pay | Source: Home / Self Care

## 2018-11-13 ENCOUNTER — Emergency Department (HOSPITAL_COMMUNITY): Payer: BLUE CROSS/BLUE SHIELD | Admitting: Certified Registered"

## 2018-11-13 ENCOUNTER — Inpatient Hospital Stay (HOSPITAL_COMMUNITY): Payer: BLUE CROSS/BLUE SHIELD

## 2018-11-13 ENCOUNTER — Inpatient Hospital Stay (HOSPITAL_COMMUNITY)
Admission: EM | Admit: 2018-11-13 | Discharge: 2018-11-25 | DRG: 957 | Disposition: A | Discharge status: Rehabilitation Facility | Payer: BLUE CROSS/BLUE SHIELD | Attending: General Surgery | Admitting: General Surgery

## 2018-11-13 ENCOUNTER — Encounter (HOSPITAL_COMMUNITY): Payer: Self-pay | Admitting: Emergency Medicine

## 2018-11-13 ENCOUNTER — Other Ambulatory Visit: Payer: Self-pay

## 2018-11-13 DIAGNOSIS — Y92410 Unspecified street and highway as the place of occurrence of the external cause: Secondary | ICD-10-CM

## 2018-11-13 DIAGNOSIS — N179 Acute kidney failure, unspecified: Secondary | ICD-10-CM

## 2018-11-13 DIAGNOSIS — T1490XA Injury, unspecified, initial encounter: Secondary | ICD-10-CM | POA: Diagnosis present

## 2018-11-13 DIAGNOSIS — Z85118 Personal history of other malignant neoplasm of bronchus and lung: Secondary | ICD-10-CM

## 2018-11-13 DIAGNOSIS — S82301B Unspecified fracture of lower end of right tibia, initial encounter for open fracture type I or II: Secondary | ICD-10-CM | POA: Diagnosis present

## 2018-11-13 DIAGNOSIS — Z4659 Encounter for fitting and adjustment of other gastrointestinal appliance and device: Secondary | ICD-10-CM

## 2018-11-13 DIAGNOSIS — R4182 Altered mental status, unspecified: Secondary | ICD-10-CM | POA: Diagnosis not present

## 2018-11-13 DIAGNOSIS — Z01818 Encounter for other preprocedural examination: Secondary | ICD-10-CM

## 2018-11-13 DIAGNOSIS — I10 Essential (primary) hypertension: Secondary | ICD-10-CM | POA: Diagnosis present

## 2018-11-13 DIAGNOSIS — Z09 Encounter for follow-up examination after completed treatment for conditions other than malignant neoplasm: Secondary | ICD-10-CM

## 2018-11-13 DIAGNOSIS — R4020 Unspecified coma: Secondary | ICD-10-CM | POA: Diagnosis not present

## 2018-11-13 DIAGNOSIS — S82201B Unspecified fracture of shaft of right tibia, initial encounter for open fracture type I or II: Principal | ICD-10-CM | POA: Diagnosis present

## 2018-11-13 DIAGNOSIS — Z87442 Personal history of urinary calculi: Secondary | ICD-10-CM

## 2018-11-13 DIAGNOSIS — R441 Visual hallucinations: Secondary | ICD-10-CM | POA: Diagnosis present

## 2018-11-13 DIAGNOSIS — S069X0A Unspecified intracranial injury without loss of consciousness, initial encounter: Secondary | ICD-10-CM | POA: Diagnosis present

## 2018-11-13 DIAGNOSIS — S82391F Other fracture of lower end of right tibia, subsequent encounter for open fracture type IIIA, IIIB, or IIIC with routine healing: Secondary | ICD-10-CM

## 2018-11-13 DIAGNOSIS — S82301S Unspecified fracture of lower end of right tibia, sequela: Secondary | ICD-10-CM | POA: Diagnosis not present

## 2018-11-13 DIAGNOSIS — D62 Acute posthemorrhagic anemia: Secondary | ICD-10-CM | POA: Diagnosis not present

## 2018-11-13 DIAGNOSIS — S2231XA Fracture of one rib, right side, initial encounter for closed fracture: Secondary | ICD-10-CM | POA: Diagnosis present

## 2018-11-13 DIAGNOSIS — S32059A Unspecified fracture of fifth lumbar vertebra, initial encounter for closed fracture: Secondary | ICD-10-CM | POA: Diagnosis present

## 2018-11-13 DIAGNOSIS — S82409B Unspecified fracture of shaft of unspecified fibula, initial encounter for open fracture type I or II: Secondary | ICD-10-CM | POA: Diagnosis present

## 2018-11-13 DIAGNOSIS — M19072 Primary osteoarthritis, left ankle and foot: Secondary | ICD-10-CM | POA: Diagnosis present

## 2018-11-13 DIAGNOSIS — Z452 Encounter for adjustment and management of vascular access device: Secondary | ICD-10-CM

## 2018-11-13 DIAGNOSIS — F10288 Alcohol dependence with other alcohol-induced disorder: Secondary | ICD-10-CM | POA: Diagnosis not present

## 2018-11-13 DIAGNOSIS — F1020 Alcohol dependence, uncomplicated: Secondary | ICD-10-CM

## 2018-11-13 DIAGNOSIS — R339 Retention of urine, unspecified: Secondary | ICD-10-CM | POA: Diagnosis present

## 2018-11-13 DIAGNOSIS — Z8249 Family history of ischemic heart disease and other diseases of the circulatory system: Secondary | ICD-10-CM

## 2018-11-13 DIAGNOSIS — Z419 Encounter for procedure for purposes other than remedying health state, unspecified: Secondary | ICD-10-CM

## 2018-11-13 DIAGNOSIS — Z825 Family history of asthma and other chronic lower respiratory diseases: Secondary | ICD-10-CM

## 2018-11-13 DIAGNOSIS — S92012A Displaced fracture of body of left calcaneus, initial encounter for closed fracture: Secondary | ICD-10-CM

## 2018-11-13 DIAGNOSIS — J9601 Acute respiratory failure with hypoxia: Secondary | ICD-10-CM | POA: Diagnosis present

## 2018-11-13 DIAGNOSIS — T148XXA Other injury of unspecified body region, initial encounter: Secondary | ICD-10-CM

## 2018-11-13 DIAGNOSIS — Z87891 Personal history of nicotine dependence: Secondary | ICD-10-CM | POA: Diagnosis not present

## 2018-11-13 DIAGNOSIS — F329 Major depressive disorder, single episode, unspecified: Secondary | ICD-10-CM | POA: Diagnosis present

## 2018-11-13 DIAGNOSIS — Z833 Family history of diabetes mellitus: Secondary | ICD-10-CM

## 2018-11-13 DIAGNOSIS — S82831S Other fracture of upper and lower end of right fibula, sequela: Secondary | ICD-10-CM | POA: Diagnosis not present

## 2018-11-13 DIAGNOSIS — S82301C Unspecified fracture of lower end of right tibia, initial encounter for open fracture type IIIA, IIIB, or IIIC: Secondary | ICD-10-CM | POA: Diagnosis present

## 2018-11-13 DIAGNOSIS — Z1159 Encounter for screening for other viral diseases: Secondary | ICD-10-CM | POA: Diagnosis not present

## 2018-11-13 DIAGNOSIS — Z981 Arthrodesis status: Secondary | ICD-10-CM

## 2018-11-13 DIAGNOSIS — G9340 Encephalopathy, unspecified: Secondary | ICD-10-CM | POA: Diagnosis present

## 2018-11-13 DIAGNOSIS — R443 Hallucinations, unspecified: Secondary | ICD-10-CM | POA: Diagnosis not present

## 2018-11-13 DIAGNOSIS — R609 Edema, unspecified: Secondary | ICD-10-CM

## 2018-11-13 DIAGNOSIS — F10231 Alcohol dependence with withdrawal delirium: Secondary | ICD-10-CM | POA: Diagnosis present

## 2018-11-13 DIAGNOSIS — Z79899 Other long term (current) drug therapy: Secondary | ICD-10-CM

## 2018-11-13 DIAGNOSIS — R569 Unspecified convulsions: Secondary | ICD-10-CM | POA: Diagnosis not present

## 2018-11-13 DIAGNOSIS — Z8711 Personal history of peptic ulcer disease: Secondary | ICD-10-CM

## 2018-11-13 DIAGNOSIS — S92012S Displaced fracture of body of left calcaneus, sequela: Secondary | ICD-10-CM | POA: Diagnosis not present

## 2018-11-13 DIAGNOSIS — Z853 Personal history of malignant neoplasm of breast: Secondary | ICD-10-CM

## 2018-11-13 DIAGNOSIS — J9690 Respiratory failure, unspecified, unspecified whether with hypoxia or hypercapnia: Secondary | ICD-10-CM

## 2018-11-13 HISTORY — PX: EXTERNAL FIXATION LEG: SHX1549

## 2018-11-13 HISTORY — PX: I & D EXTREMITY: SHX5045

## 2018-11-13 HISTORY — DX: Gastric ulcer, unspecified as acute or chronic, without hemorrhage or perforation: K25.9

## 2018-11-13 HISTORY — DX: Calculus of kidney: N20.0

## 2018-11-13 LAB — COMPREHENSIVE METABOLIC PANEL
ALT: 48 U/L — ABNORMAL HIGH (ref 0–44)
AST: 113 U/L — ABNORMAL HIGH (ref 15–41)
Albumin: 3.5 g/dL (ref 3.5–5.0)
Alkaline Phosphatase: 129 U/L — ABNORMAL HIGH (ref 38–126)
Anion gap: 14 (ref 5–15)
BUN: 23 mg/dL (ref 8–23)
CO2: 21 mmol/L — ABNORMAL LOW (ref 22–32)
Calcium: 9.6 mg/dL (ref 8.9–10.3)
Chloride: 104 mmol/L (ref 98–111)
Creatinine, Ser: 2.41 mg/dL — ABNORMAL HIGH (ref 0.44–1.00)
GFR calc Af Amer: 24 mL/min — ABNORMAL LOW (ref >60)
GFR calc non Af Amer: 21 mL/min — ABNORMAL LOW (ref >60)
Glucose, Bld: 163 mg/dL — ABNORMAL HIGH (ref 70–99)
Potassium: 3.7 mmol/L (ref 3.5–5.1)
Sodium: 139 mmol/L (ref 135–145)
Total Bilirubin: 1.4 mg/dL — ABNORMAL HIGH (ref 0.3–1.2)
Total Protein: 6.6 g/dL (ref 6.5–8.1)

## 2018-11-13 LAB — SAMPLE TO BLOOD BANK

## 2018-11-13 LAB — POCT I-STAT 7, (LYTES, BLD GAS, ICA,H+H)
Acid-base deficit: 3 mmol/L — ABNORMAL HIGH (ref 0.0–2.0)
Bicarbonate: 24.4 mmol/L (ref 20.0–28.0)
Calcium, Ion: 1.24 mmol/L (ref 1.15–1.40)
HCT: 20 % — ABNORMAL LOW (ref 36.0–46.0)
Hemoglobin: 6.8 g/dL — CL (ref 12.0–15.0)
O2 Saturation: 100 %
Patient temperature: 97.6
Potassium: 3.7 mmol/L (ref 3.5–5.1)
Sodium: 141 mmol/L (ref 135–145)
TCO2: 26 mmol/L (ref 22–32)
pCO2 arterial: 58.8 mmHg — ABNORMAL HIGH (ref 32.0–48.0)
pH, Arterial: 7.223 — ABNORMAL LOW (ref 7.350–7.450)
pO2, Arterial: 561 mmHg — ABNORMAL HIGH (ref 83.0–108.0)

## 2018-11-13 LAB — PROTIME-INR
INR: 1.2 (ref 0.8–1.2)
Prothrombin Time: 14.7 seconds (ref 11.4–15.2)

## 2018-11-13 LAB — CK TOTAL AND CKMB (NOT AT ARMC)
CK, MB: 42.8 ng/mL — ABNORMAL HIGH (ref 0.5–5.0)
Relative Index: 1.5 (ref 0.0–2.5)
Total CK: 2776 U/L — ABNORMAL HIGH (ref 38–234)

## 2018-11-13 LAB — CBC
HCT: 31.8 % — ABNORMAL LOW (ref 36.0–46.0)
Hemoglobin: 10 g/dL — ABNORMAL LOW (ref 12.0–15.0)
MCH: 35.7 pg — ABNORMAL HIGH (ref 26.0–34.0)
MCHC: 31.4 g/dL (ref 30.0–36.0)
MCV: 113.6 fL — ABNORMAL HIGH (ref 80.0–100.0)
Platelets: 330 10*3/uL (ref 150–400)
RBC: 2.8 MIL/uL — ABNORMAL LOW (ref 3.87–5.11)
RDW: 13.9 % (ref 11.5–15.5)
WBC: 14.5 10*3/uL — ABNORMAL HIGH (ref 4.0–10.5)
nRBC: 0 % (ref 0.0–0.2)

## 2018-11-13 LAB — LACTIC ACID, PLASMA: Lactic Acid, Venous: 3.2 mmol/L (ref 0.5–1.9)

## 2018-11-13 LAB — CDS SEROLOGY

## 2018-11-13 LAB — ETHANOL: Alcohol, Ethyl (B): <10 mg/dL (ref <10)

## 2018-11-13 LAB — SARS CORONAVIRUS 2 BY RT PCR (HOSPITAL ORDER, PERFORMED IN ~~LOC~~ HOSPITAL LAB): SARS Coronavirus 2: NEGATIVE

## 2018-11-13 LAB — MRSA PCR SCREENING: MRSA by PCR: NEGATIVE

## 2018-11-13 SURGERY — IRRIGATION AND DEBRIDEMENT EXTREMITY
Anesthesia: General | Laterality: Right

## 2018-11-13 MED ORDER — CEFAZOLIN SODIUM-DEXTROSE 1-4 GM/50ML-% IV SOLN: 1.0000 g | Freq: Once | INTRAVENOUS | Status: DC

## 2018-11-13 MED ORDER — LORAZEPAM 2 MG/ML IJ SOLN
1.0000 mg | Freq: Once | INTRAMUSCULAR | Status: AC
  Administered 2018-11-13: 14:00:00 1 mg via INTRAVENOUS
  Filled 2018-11-13: qty 1

## 2018-11-13 MED ORDER — DEXMEDETOMIDINE HCL IN NACL 400 MCG/100ML IV SOLN
0.4000 ug/kg/h | INTRAVENOUS | Status: DC
  Administered 2018-11-13: 0.4 ug/kg/h via INTRAVENOUS
  Administered 2018-11-13: 20:00:00 via INTRAVENOUS
  Administered 2018-11-14 – 2018-11-15 (×4): 0.4 ug/kg/h via INTRAVENOUS
  Administered 2018-11-15: 1 ug/kg/h via INTRAVENOUS
  Administered 2018-11-16: 1.2 ug/kg/h via INTRAVENOUS
  Filled 2018-11-13 (×7): qty 100

## 2018-11-13 MED ORDER — TETANUS-DIPHTH-ACELL PERTUSSIS 5-2.5-18.5 LF-MCG/0.5 IM SUSP
0.5000 mL | Freq: Once | INTRAMUSCULAR | Status: DC
  Filled 2018-11-13: qty 0.5

## 2018-11-13 MED ORDER — DEXMEDETOMIDINE HCL IN NACL 200 MCG/50ML IV SOLN
0.4000 ug/kg/h | INTRAVENOUS | Status: DC
  Administered 2018-11-13: 0.4 ug/kg/h via INTRAVENOUS
  Filled 2018-11-13: qty 50

## 2018-11-13 MED ORDER — LORAZEPAM 2 MG/ML IJ SOLN
INTRAMUSCULAR | Status: AC
  Filled 2018-11-13: qty 1

## 2018-11-13 MED ORDER — BUPIVACAINE HCL (PF) 0.25 % IJ SOLN
INTRAMUSCULAR | Status: AC
  Filled 2018-11-13: qty 30

## 2018-11-13 MED ORDER — THIAMINE HCL 100 MG/ML IJ SOLN: 500.0000 mg | Freq: Once | INTRAMUSCULAR | Status: DC

## 2018-11-13 MED ORDER — ONDANSETRON HCL 4 MG/2ML IJ SOLN
4.0000 mg | Freq: Once | INTRAMUSCULAR | Status: AC
  Administered 2018-11-13: 14:00:00 4 mg via INTRAVENOUS

## 2018-11-13 MED ORDER — ONDANSETRON HCL 4 MG/2ML IJ SOLN
INTRAMUSCULAR | Status: AC
  Filled 2018-11-13: qty 2

## 2018-11-13 MED ORDER — SODIUM CHLORIDE 0.9 % IV BOLUS: 1000.0000 mL | Freq: Once | INTRAVENOUS | Status: AC

## 2018-11-13 MED ORDER — SODIUM CHLORIDE 0.9 % IR SOLN
Status: DC | PRN
  Administered 2018-11-13: 6000 mL

## 2018-11-13 MED ORDER — PHENYLEPHRINE 40 MCG/ML (10ML) SYRINGE FOR IV PUSH (FOR BLOOD PRESSURE SUPPORT)
PREFILLED_SYRINGE | INTRAVENOUS | Status: AC
  Filled 2018-11-13: qty 10

## 2018-11-13 MED ORDER — THIAMINE HCL 100 MG/ML IJ SOLN
500.0000 mg | INTRAVENOUS | Status: AC
  Administered 2018-11-13: 500 mg via INTRAVENOUS
  Filled 2018-11-13: qty 5

## 2018-11-13 MED ORDER — PROPOFOL 10 MG/ML IV BOLUS
INTRAVENOUS | Status: AC
  Filled 2018-11-13: qty 20

## 2018-11-13 MED ORDER — SODIUM CHLORIDE 0.9 % IV SOLN
INTRAVENOUS | Status: DC
  Administered 2018-11-13 – 2018-11-17 (×9): via INTRAVENOUS

## 2018-11-13 MED ORDER — PHENYLEPHRINE HCL (PRESSORS) 10 MG/ML IV SOLN
INTRAVENOUS | Status: DC | PRN
  Administered 2018-11-13: 80 ug via INTRAVENOUS
  Administered 2018-11-13: 120 ug via INTRAVENOUS
  Administered 2018-11-13: 80 ug via INTRAVENOUS

## 2018-11-13 MED ORDER — PROPOFOL 1000 MG/100ML IV EMUL
5.0000 ug/kg/min | INTRAVENOUS | Status: DC
  Administered 2018-11-14: 15 ug/kg/min via INTRAVENOUS
  Administered 2018-11-14 (×2): 40 ug/kg/min via INTRAVENOUS
  Administered 2018-11-15: 15 ug/kg/min via INTRAVENOUS
  Filled 2018-11-13 (×4): qty 100

## 2018-11-13 MED ORDER — ROCURONIUM BROMIDE 10 MG/ML (PF) SYRINGE
PREFILLED_SYRINGE | INTRAVENOUS | Status: DC | PRN
  Administered 2018-11-13: 50 mg via INTRAVENOUS

## 2018-11-13 MED ORDER — SODIUM CHLORIDE 0.9% IV SOLUTION: Freq: Once | INTRAVENOUS | Status: AC

## 2018-11-13 MED ORDER — CEFAZOLIN SODIUM-DEXTROSE 2-4 GM/100ML-% IV SOLN
2.0000 g | Freq: Once | INTRAVENOUS | Status: AC
  Administered 2018-11-13: 14:00:00 2 g via INTRAVENOUS
  Filled 2018-11-13: qty 100

## 2018-11-13 MED ORDER — SODIUM CHLORIDE 0.9 % IV SOLN
INTRAVENOUS | Status: DC | PRN
  Administered 2018-11-13: 23:00:00 1000 mL via INTRAVENOUS
  Administered 2018-11-14: 500 mL via INTRAVENOUS
  Administered 2018-11-15: 220 mL via INTRAVENOUS

## 2018-11-13 MED ORDER — ALBUMIN HUMAN 5 % IV SOLN
INTRAVENOUS | Status: DC | PRN
  Administered 2018-11-13: 20:00:00 via INTRAVENOUS

## 2018-11-13 MED ORDER — LORAZEPAM 2 MG/ML IJ SOLN
INTRAMUSCULAR | Status: AC | PRN
  Administered 2018-11-13 (×2): 2 mg via INTRAVENOUS

## 2018-11-13 MED ORDER — SUCCINYLCHOLINE CHLORIDE 200 MG/10ML IV SOSY
PREFILLED_SYRINGE | INTRAVENOUS | Status: DC | PRN
  Administered 2018-11-13: 70 mg via INTRAVENOUS

## 2018-11-13 MED ORDER — FENTANYL CITRATE (PF) 100 MCG/2ML IJ SOLN
50.0000 ug | INTRAMUSCULAR | Status: DC | PRN
  Administered 2018-11-13: 50 ug via INTRAVENOUS
  Filled 2018-11-13: qty 2

## 2018-11-13 MED ORDER — LIDOCAINE 2% (20 MG/ML) 5 ML SYRINGE
INTRAMUSCULAR | Status: DC | PRN
  Administered 2018-11-13: 60 mg via INTRAVENOUS

## 2018-11-13 MED ORDER — MIDAZOLAM HCL 2 MG/2ML IJ SOLN
INTRAMUSCULAR | Status: DC | PRN
  Administered 2018-11-13: 2 mg via INTRAVENOUS

## 2018-11-13 MED ORDER — PIPERACILLIN-TAZOBACTAM 3.375 G IVPB 30 MIN
3.3750 g | Freq: Once | INTRAVENOUS | Status: AC
  Administered 2018-11-13: 23:00:00 3.375 g via INTRAVENOUS
  Filled 2018-11-13: qty 50

## 2018-11-13 MED ORDER — PIPERACILLIN-TAZOBACTAM 3.375 G IVPB
3.3750 g | Freq: Three times a day (TID) | INTRAVENOUS | Status: DC
  Administered 2018-11-14 – 2018-11-15 (×4): 3.375 g via INTRAVENOUS
  Filled 2018-11-13 (×2): qty 50

## 2018-11-13 MED ORDER — LACTATED RINGERS IV SOLN
INTRAVENOUS | Status: DC | PRN
  Administered 2018-11-13 (×3): via INTRAVENOUS

## 2018-11-13 MED ORDER — SODIUM CHLORIDE 0.9 % IV BOLUS
1000.0000 mL | Freq: Once | INTRAVENOUS | Status: AC
  Administered 2018-11-13: 13:00:00 1000 mL via INTRAVENOUS

## 2018-11-13 MED ORDER — SODIUM CHLORIDE 0.9 % IV BOLUS
1000.0000 mL | Freq: Once | INTRAVENOUS | Status: AC
  Administered 2018-11-13: 15:00:00 1000 mL via INTRAVENOUS

## 2018-11-13 MED ORDER — FENTANYL CITRATE (PF) 250 MCG/5ML IJ SOLN
INTRAMUSCULAR | Status: DC | PRN
  Administered 2018-11-13: 100 ug via INTRAVENOUS

## 2018-11-13 MED ORDER — MIDAZOLAM HCL 2 MG/2ML IJ SOLN
INTRAMUSCULAR | Status: AC
  Filled 2018-11-13: qty 2

## 2018-11-13 MED ORDER — FENTANYL CITRATE (PF) 250 MCG/5ML IJ SOLN
INTRAMUSCULAR | Status: AC
  Filled 2018-11-13: qty 5

## 2018-11-13 MED ORDER — CEFAZOLIN SODIUM-DEXTROSE 2-3 GM-%(50ML) IV SOLR
INTRAVENOUS | Status: DC | PRN
  Administered 2018-11-13: 2 g via INTRAVENOUS

## 2018-11-13 MED ORDER — PROPOFOL 10 MG/ML IV BOLUS
INTRAVENOUS | Status: DC | PRN
  Administered 2018-11-13: 50 mg via INTRAVENOUS

## 2018-11-13 SURGICAL SUPPLY — 43 items
BANDAGE ACE 4X5 VEL STRL LF (GAUZE/BANDAGES/DRESSINGS) ×2 IMPLANT
BANDAGE ELASTIC 4 VELCRO ST LF (GAUZE/BANDAGES/DRESSINGS) ×2 IMPLANT
BAR GLASS FIBER EXFX 11X400 (EXFIX) ×4 IMPLANT
BNDG GAUZE ELAST 4 BULKY (GAUZE/BANDAGES/DRESSINGS) ×2 IMPLANT
CANISTER WOUNDNEG PRESSURE 500 (CANNISTER) ×2 IMPLANT
CLAMP PIN 45MM 1 BAR (EXFIX) ×4 IMPLANT
COVER SURGICAL LIGHT HANDLE (MISCELLANEOUS) ×2 IMPLANT
DRAPE C-ARM 42X72 X-RAY (DRAPES) ×2 IMPLANT
DRSG ADAPTIC 3X8 NADH LF (GAUZE/BANDAGES/DRESSINGS) ×2 IMPLANT
ELECT REM PT RETURN 9FT ADLT (ELECTROSURGICAL) ×2
ELECTRODE REM PT RTRN 9FT ADLT (ELECTROSURGICAL) ×1 IMPLANT
GAUZE SPONGE 4X4 12PLY STRL (GAUZE/BANDAGES/DRESSINGS) ×2 IMPLANT
GAUZE XEROFORM 1X8 LF (GAUZE/BANDAGES/DRESSINGS) ×2 IMPLANT
GLOVE BIOGEL PI IND STRL 7.0 (GLOVE) ×1 IMPLANT
GLOVE BIOGEL PI IND STRL 8 (GLOVE) ×2 IMPLANT
GLOVE BIOGEL PI INDICATOR 7.0 (GLOVE) ×1
GLOVE BIOGEL PI INDICATOR 8 (GLOVE) ×2
GLOVE ECLIPSE 7.5 STRL STRAW (GLOVE) ×4 IMPLANT
GLOVE ECLIPSE 8.0 STRL XLNG CF (GLOVE) ×4 IMPLANT
GLOVE SURG SS PI 7.0 STRL IVOR (GLOVE) ×4 IMPLANT
GOWN STRL REUS W/ TWL LRG LVL3 (GOWN DISPOSABLE) ×2 IMPLANT
GOWN STRL REUS W/ TWL XL LVL3 (GOWN DISPOSABLE) ×1 IMPLANT
GOWN STRL REUS W/TWL LRG LVL3 (GOWN DISPOSABLE) ×2
GOWN STRL REUS W/TWL XL LVL3 (GOWN DISPOSABLE) ×1
HALF PIN 5.0X160 (EXFIX) ×4 IMPLANT
HANDPIECE INTERPULSE COAX TIP (DISPOSABLE) ×1
KIT BASIN OR (CUSTOM PROCEDURE TRAY) ×2 IMPLANT
KIT TURNOVER KIT B (KITS) ×2 IMPLANT
MANIFOLD NEPTUNE II (INSTRUMENTS) ×2 IMPLANT
NS IRRIG 1000ML POUR BTL (IV SOLUTION) ×2 IMPLANT
PACK ORTHO EXTREMITY (CUSTOM PROCEDURE TRAY) ×2 IMPLANT
PAD ARMBOARD 7.5X6 YLW CONV (MISCELLANEOUS) ×4 IMPLANT
PIN CLAMP 2BAR 75MM BLUE (EXFIX) ×2 IMPLANT
PIN TRANSFIXING 5.0 (EXFIX) ×4 IMPLANT
POST ANGLED 900 11 (EXFIX) ×4 IMPLANT
SET HNDPC FAN SPRY TIP SCT (DISPOSABLE) ×1 IMPLANT
SUT ETHILON 2 0 FS 18 (SUTURE) ×4 IMPLANT
TOWEL OR 17X24 6PK STRL BLUE (TOWEL DISPOSABLE) ×2 IMPLANT
TOWEL OR 17X26 10 PK STRL BLUE (TOWEL DISPOSABLE) ×2 IMPLANT
TRAY FOLEY MTR SLVR 16FR STAT (SET/KITS/TRAYS/PACK) ×2 IMPLANT
TUBE CONNECTING 12X1/4 (SUCTIONS) ×2 IMPLANT
UNDERPAD 30X30 (UNDERPADS AND DIAPERS) ×2 IMPLANT
YANKAUER SUCT BULB TIP NO VENT (SUCTIONS) ×2 IMPLANT

## 2018-11-13 NOTE — ED Provider Notes (Signed)
Blood pressure (!) 154/106, pulse (!) 129, temperature (!) 97.1 F (36.2 C), temperature source Oral, resp. rate (!) 26, height 5\' 2"  (1.575 m), weight 69.9 kg, SpO2 97 %.  Assuming care from Dr. Maryan Rued.  In short, Sarah Weeks is a 63 y.o. female with a chief complaint of Marine scientist .  Refer to the original H&P for additional details.  The current plan of care is to follow up on CT scans and speak with ortho. Starting patient on Precedex for presumed DTs.  Evaluated the patient with Dr. Maryan Rued at bedside.  Mental status has not changed since receiving the Ativan.  Patient having to be heavily sedated at this time but maintaining airway.  06:30 PM  Patient with some continued shaking.  Mental status unchanged.  Spoke with Dr. Griffin Basil regarding open tib-fib.  Antibiotics already given.  Will base intervention timing on patient stability from DT perspective.  Will speak with trauma as the patient also has an L5 compression fracture and thus multisystem injury.  Question if the patient would be suited more for medical ICU given her DTs seeming to be the most pressing issue. Will also consult Neurology to assist with mental status change and question EEG utility in the setting of presumed complicated EtOH withdrawal.  Other than continued altered mental status the patient does not have any other obvious seizure activity but could be subclinical.  Vital signs are improving dramatically on Precedex. Patient continuing to protect airway and not requiring intubation.   07:00 PM  Spoke with Dr. Griffin Basil who plans to take to the OR tonight.  Spoke with Dr. Donne Hazel who will leave a no after agrees with ICU admission given the severe alcohol withdrawal.  Also recommends neurosurgery consultation.  Spoke with Dr. Cheral Marker.  He will call in EEG tech to perform EEG. Can consult if needed later.   07:30 PM  Spoke with Dr. Vertell Limber. NWB for now and he will consult in the AM.   Discussed patient's case  with ICU to request admission. Patient and family (if present) updated with plan. Care transferred to ICU service.  I reviewed all nursing notes, vitals, pertinent old records, EKGs, labs, imaging (as available).   CRITICAL CARE Performed by: Sarah Weeks Total critical care time: 75 minutes Critical care time was exclusive of separately billable procedures and treating other patients. Critical care was necessary to treat or prevent imminent or life-threatening deterioration. Critical care was time spent personally by me on the following activities: development of treatment plan with patient and/or surrogate as well as nursing, discussions with consultants, evaluation of patient's response to treatment, examination of patient, obtaining history from patient or surrogate, ordering and performing treatments and interventions, ordering and review of laboratory studies, ordering and review of radiographic studies, pulse oximetry and re-evaluation of patient's condition.  Sarah Quinton, MD Emergency Medicine    , Sarah Olds, MD 11/14/18 [phone]
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
## 2018-11-13 NOTE — ED Notes (Signed)
Nurse navigator spoke with patient did not want any family updated at this time.
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
## 2018-11-13 NOTE — Progress Notes (Signed)
Orthopedic Tech Progress Note Patient Details:  Sarah Weeks [birth year] [id number]  Ortho Devices Type of Ortho Device: Ace wrap Ortho Device/Splint Location: Bilateral short leg splints Ortho Device/Splint Interventions: Application   Post Interventions Patient Tolerated: Well Instructions Provided: Care of device   [name] 11/13/2018, 1:44 PM

## 2018-11-13 NOTE — ED Notes (Signed)
Patient experiencing visual hallucinations, arms extended, very fidgety. She follows commands and answers most questions appropriately, but is increasingly becoming agitated. MD aware, gave verbal order for 2mg  Ativan.
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
## 2018-11-13 NOTE — Anesthesia Preprocedure Evaluation (Addendum)
Anesthesia Evaluation   Patient unresponsive    Reviewed: Allergy & Precautions, Patient's Chart, lab work & pertinent test resultsPreop documentation limited or incomplete due to emergent nature of procedure.  Airway      Mouth opening: Limited Mouth Opening  Dental  (+) Missing,    Pulmonary neg pulmonary ROS,    Pulmonary exam normal        Cardiovascular  Rate:Tachycardia  ECG: ST, rate 136. Sinus tachycardia Abnormal R-wave progression, early transition   Neuro/Psych PSYCHIATRIC DISORDERS  C-spine not cleared    GI/Hepatic negative GI ROS, (+)     substance abuse  alcohol use,   Endo/Other  negative endocrine ROS  Renal/GU ARFRenal disease     Musculoskeletal negative musculoskeletal ROS (+)   Abdominal   Peds  Hematology  (+) anemia , HLD   Anesthesia Other Findings Right Distal Tibia Fracture  Reproductive/Obstetrics                            Anesthesia Physical Anesthesia Plan  ASA: III and emergent  Anesthesia Plan: General   Post-op Pain Management:    Induction: Intravenous  PONV Risk Score and Plan: 3 and Ondansetron, Dexamethasone, Midazolam and Treatment may vary due to age or medical condition  Airway Management Planned: Oral ETT and Video Laryngoscope Planned  Additional Equipment:   Intra-op Plan:   Post-operative Plan: Post-operative intubation/ventilation  Informed Consent:     Only emergency history available  Plan Discussed with: CRNA and Surgeon  Anesthesia Plan Comments:        Anesthesia Quick Evaluation

## 2018-11-13 NOTE — Procedures (Signed)
Central Venous Catheter Insertion Procedure Note  Procedure: Insertion of Central Venous Catheter  Indications:  vascular access  Procedure Details  Informed consent was obtained for the procedure, including sedation.  Risks of lung perforation, hemorrhage, arrhythmia, and adverse drug reaction were discussed.   Maximum sterile technique was used including antiseptics, cap, gloves, gown, hand hygiene, mask and sheet.  Under sterile conditions the skin above the on the left subclavian vein was prepped with betadine and covered with a sterile drape. Local anesthesia was applied to the skin and subcutaneous tissues. A 22-gauge needle was used to identify the vein. An 18-gauge needle was then inserted into the vein. A guide wire was then passed easily through the catheter. There were no arrhythmias. The catheter was then withdrawn. A 7.0 French triple-lumen was then inserted into the vessel over the guide wire. The catheter was sutured into place.  Findings: There were no changes to vital signs. Catheter was flushed with 10 cc NS. Patient did tolerate procedure well.  Recommendations: CXR ordered to verify placement.

## 2018-11-13 NOTE — Anesthesia Postprocedure Evaluation (Signed)
Anesthesia Post Note  Patient: Sarah Weeks  Procedure(s) Performed: IRRIGATION AND DEBRIDEMENT EXTREMITY (Right ) EXTERNAL FIXATION LEG (Right )     Patient location during evaluation: ICU Anesthesia Type: General Level of consciousness: sedated Pain management: pain level controlled Vital Signs Assessment: post-procedure vital signs reviewed and stable Respiratory status: patient remains intubated per anesthesia plan Cardiovascular status: stable Postop Assessment: no apparent nausea or vomiting Anesthetic complications: no    Last Vitals:  Vitals:   11/13/18 1800 11/13/18 2108  BP: (!) 116/103 122/84  Pulse: (!) 106 84  Resp: (!) 22 14  Temp:    SpO2: 100% 97%    Last Pain:  Vitals:   11/13/18 1315  TempSrc:   PainSc: 10-Worst pain ever                 Ryan P Ellender
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
## 2018-11-13 NOTE — Progress Notes (Signed)
EEG complete - results pending 
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
## 2018-11-13 NOTE — Transfer of Care (Signed)
Immediate Anesthesia Transfer of Care Note  Patient: Sarah Weeks  Procedure(s) Performed: IRRIGATION AND DEBRIDEMENT EXTREMITY (Right ) EXTERNAL FIXATION LEG (Right )  Patient Location: PACU  Anesthesia Type:General  Level of Consciousness: Patient remains intubated per anesthesia plan  Airway & Oxygen Therapy: Patient remains intubated per anesthesia plan and Patient placed on Ventilator (see vital sign flow sheet for setting)  Post-op Assessment: Report given to RN and Post -op Vital signs reviewed and stable  Post vital signs: Reviewed and stable  Last Vitals:  Vitals Value Taken Time  BP 122/84 11/13/2018  9:09 PM  Temp    Pulse 43 11/13/2018  9:12 PM  Resp 23 11/13/2018  9:16 PM  SpO2 70 % 11/13/2018  9:12 PM  Vitals shown include unvalidated device data.  Last Pain:  Vitals:   11/13/18 1315  TempSrc:   PainSc: 10-Worst pain ever         Complications: No apparent anesthesia complications

## 2018-11-13 NOTE — ED Notes (Signed)
This RN was about to pull up Ativan at desk when patient began pulling at cords and pulled on her IV. She was increasingly agitated, confused, and hallucinating - reaching at things in front of her (nothing there). I placed her arm back down at her side and told her to not pull at things and her other arm flailed across her and hit my elbow (was holding uncapped needle and Ativan in that hand), causing the needle to graze my neck. Patient unaware of what happened. MD aware of patient condition. Agricultural consultant notified. SZP completed.
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
## 2018-11-13 NOTE — Anesthesia Procedure Notes (Signed)
Procedure Name: Intubation Date/Time: 11/13/2018 7:43 PM Performed by: Babs Bertin, CRNA Pre-anesthesia Checklist: Patient identified, Emergency Drugs available, Suction available and Patient being monitored Patient Re-evaluated:Patient Re-evaluated prior to induction Oxygen Delivery Method: Circle System Utilized Preoxygenation: Pre-oxygenation with 100% oxygen Induction Type: IV induction Ventilation: Mask ventilation without difficulty Laryngoscope Size: Glidescope Tube type: Oral Tube size: 7.0 mm Number of attempts: 1 Airway Equipment and Method: Stylet and Oral airway Placement Confirmation: ETT inserted through vocal cords under direct vision,  positive ETCO2 and breath sounds checked- equal and bilateral Secured at: 21 cm Tube secured with: Tape Dental Injury: Teeth and Oropharynx as per pre-operative assessment

## 2018-11-13 NOTE — Progress Notes (Signed)
Orthopedic Tech Progress Note Patient Details:  Sarah Weeks 14-Jun-1956 [id number]  Ortho Devices Type of Ortho Device: Ace wrap Ortho Device/Splint Location: Level 2 Trauma Ortho Device/Splint Interventions: Application   Post Interventions Patient Tolerated: Well Instructions Provided: Care of device   [name] 11/13/2018, 1:46 PM

## 2018-11-13 NOTE — Op Note (Signed)
Orthopaedic Surgery Operative Note (CSN: [id number])  LASHANNON BRESNAN  04/08/1956 Date of Surgery: 11/13/2018   Diagnoses:  3A Right Distal Tibia Fracture above previous fusion  Procedure: Uniplane Ex fix placement Hardware removal tibiotalar fusion screw I&D bone, skin soft tissue in setting of open fracture   Operative Finding Successful completion of planned procedure.  The medialmost tibiotalar screw that has been used for previous fusion was within the 10 cm medial laceration at the level of the medial malleolus.  We remove this screw.  There was some mild contamination of the wound itself.  Her skin was actually fairly minimal in regards to swelling.  Good pulses before and after case.  Good fixation with the delta frame.  Post-operative plan: The patient will be admitted to ICU due to withdrawals.  Recommend Lovenox 40 mg a day for orthopedic DVT prophylaxis however if patient needs more significant anticoagulation and this per the ICU team we defer to their management.  Wound VAC on laceration until OR for repeat washout and likely definitive fixation with Dr. Marcelino Scot or Haddix later this week.  Zosyn for prophylaxis in the setting of open fracture.  Post-Op Diagnosis: Same Surgeons:Primary: Hiram Gash, MD Assistants: None Location: [facility] OR ROOM 06 Anesthesia: General anesthesia with endotracheal tube Antibiotics: Ancef 2g preop Tourniquet time: * No tourniquets in log * Estimated Blood Loss: Minimal Complications: None Specimens: None Implants: * No implants in log *  Indications for Surgery:   DORENDA PFANNENSTIEL is a 63 y.o. female with limited history secondary to patient being obtunded at the time of evaluation.  She was reportedly found potentially multiple days after her injury.  An open fracture of her distal tibia just above the previous fusion.  No history was obtained outside of this as the patient was obtunded at the time of evaluation.  He reported to the [facility]  emergency room around 1:00 this afternoon and I was called around 6:00 and immediately came in for operative management of her open fracture.  The ICU team was consulted the patient had reportedly been going into delirium tremens and required sedation and was not able to protect her airway in the emergency room.  Plan from the anesthesia team is to leave her intubated at the time that surgery was complete.  Emergency consent was done with myself and Dr. Roanna Banning secondary to the patient being obtunded.   Procedure:   The patient was identified in the preoperative holding area where the surgical site was marked. The patient was taken to the OR where a procedural timeout was called and the above noted anesthesia was induced.  Preoperative antibiotics were dosed.  The patient's right lower extremity was prepped and draped in the usual sterile fashion.  A second preoperative timeout was called.     Used the 10 cm lateral laceration has a window to see the open fracture.  Significant bony comminution was removed sharply and excisionally.  Skin and subcutaneous tissue were also removed.  The posterior tibial tendons appear to be mostly intact.  Patient had a good pulse in the dorsalis pedis.  We performed a thorough irrigation with 6 L of normal saline and debrided again noting no significant hot contamination at the time of the procedure was complete.  We then closed the incision loosely with 2-0 nylon suture.  A incisional wound VAC was placed over the laceration.  We began by using fluoroscopy to confirm that we were able to close reduce the ankle.  This point a delta frame was created with 2 half pins in the tibia achieving bicortical purchase.  We verified our position on fluoroscopy.  We then proceeded to the ankle and identified with fluoroscopy appropriate sites for 2 transfixion pins to control dorsiflexion plantarflexion of the foot as well as our reduction.  These were placed without issue in a  percutaneous fashion using a hemostat to spread down to bone to avoid damage to surrounding neurovascular structures.  We also performed these pins placement with a sleeve in place to protect the soft tissues.  Now that all pins were adequately positioned we used fluoroscopy to guide our reduction and created a delta frame with a posterior kickstand to keep the foot off the bed.  Final fluoroscopic images demonstrated an appropriate reduction of the tibia and clearly the previous fusion of the tibiotalar joint was again noted.  Final films were obtained and the pins were dressed in a sterile fashion with xeroform and kerlix.  \  She was left intubated and taken to the ICU by the anesthesia team.

## 2018-11-14 ENCOUNTER — Inpatient Hospital Stay (HOSPITAL_COMMUNITY): Payer: BLUE CROSS/BLUE SHIELD

## 2018-11-14 ENCOUNTER — Encounter (HOSPITAL_COMMUNITY): Payer: Self-pay | Admitting: Orthopedic Surgery

## 2018-11-14 DIAGNOSIS — G9340 Encephalopathy, unspecified: Secondary | ICD-10-CM

## 2018-11-14 DIAGNOSIS — R569 Unspecified convulsions: Secondary | ICD-10-CM

## 2018-11-14 LAB — HIV ANTIBODY (ROUTINE TESTING W REFLEX): HIV Screen 4th Generation wRfx: NONREACTIVE

## 2018-11-14 LAB — BASIC METABOLIC PANEL
Anion gap: 7 (ref 5–15)
BUN: 21 mg/dL (ref 8–23)
CO2: 24 mmol/L (ref 22–32)
Calcium: 7.5 mg/dL — ABNORMAL LOW (ref 8.9–10.3)
Chloride: 110 mmol/L (ref 98–111)
Creatinine, Ser: 1.65 mg/dL — ABNORMAL HIGH (ref 0.44–1.00)
GFR calc Af Amer: 38 mL/min — ABNORMAL LOW (ref >60)
GFR calc non Af Amer: 33 mL/min — ABNORMAL LOW (ref >60)
Glucose, Bld: 115 mg/dL — ABNORMAL HIGH (ref 70–99)
Potassium: 3.9 mmol/L (ref 3.5–5.1)
Sodium: 141 mmol/L (ref 135–145)

## 2018-11-14 LAB — CBC
HCT: 27.3 % — ABNORMAL LOW (ref 36.0–46.0)
HCT: 27.4 % — ABNORMAL LOW (ref 36.0–46.0)
Hemoglobin: 8.5 g/dL — ABNORMAL LOW (ref 12.0–15.0)
Hemoglobin: 8.5 g/dL — ABNORMAL LOW (ref 12.0–15.0)
MCH: 33.7 pg (ref 26.0–34.0)
MCH: 33.7 pg (ref 26.0–34.0)
MCHC: 31.1 g/dL (ref 30.0–36.0)
MCHC: 31 g/dL (ref 30.0–36.0)
MCV: 108.3 fL — ABNORMAL HIGH (ref 80.0–100.0)
MCV: 108.7 fL — ABNORMAL HIGH (ref 80.0–100.0)
Platelets: 168 10*3/uL (ref 150–400)
Platelets: 151 10*3/uL (ref 150–400)
RBC: 2.52 MIL/uL — ABNORMAL LOW (ref 3.87–5.11)
RBC: 2.52 MIL/uL — ABNORMAL LOW (ref 3.87–5.11)
RDW: 18.6 % — ABNORMAL HIGH (ref 11.5–15.5)
RDW: 21.2 % — ABNORMAL HIGH (ref 11.5–15.5)
WBC: 9.1 10*3/uL (ref 4.0–10.5)
WBC: 8 10*3/uL (ref 4.0–10.5)
nRBC: 0 % (ref 0.0–0.2)
nRBC: 0 % (ref 0.0–0.2)

## 2018-11-14 LAB — POCT I-STAT 7, (LYTES, BLD GAS, ICA,H+H)
Acid-base deficit: 3 mmol/L — ABNORMAL HIGH (ref 0.0–2.0)
Acid-base deficit: 5 mmol/L — ABNORMAL HIGH (ref 0.0–2.0)
Bicarbonate: 22.9 mmol/L (ref 20.0–28.0)
Bicarbonate: 20.2 mmol/L (ref 20.0–28.0)
Calcium, Ion: 1.14 mmol/L — ABNORMAL LOW (ref 1.15–1.40)
Calcium, Ion: 1.09 mmol/L — ABNORMAL LOW (ref 1.15–1.40)
HCT: 24 % — ABNORMAL LOW (ref 36.0–46.0)
HCT: 21 % — ABNORMAL LOW (ref 36.0–46.0)
Hemoglobin: 8.2 g/dL — ABNORMAL LOW (ref 12.0–15.0)
Hemoglobin: 7.1 g/dL — ABNORMAL LOW (ref 12.0–15.0)
O2 Saturation: 99 %
O2 Saturation: 98 %
Patient temperature: 37.1
Patient temperature: 36.8
Potassium: 3.8 mmol/L (ref 3.5–5.1)
Potassium: 3.4 mmol/L — ABNORMAL LOW (ref 3.5–5.1)
Sodium: 142 mmol/L (ref 135–145)
Sodium: 143 mmol/L (ref 135–145)
TCO2: 24 mmol/L (ref 22–32)
TCO2: 21 mmol/L — ABNORMAL LOW (ref 22–32)
pCO2 arterial: 43.7 mmHg (ref 32.0–48.0)
pCO2 arterial: 37.7 mmHg (ref 32.0–48.0)
pH, Arterial: 7.328 — ABNORMAL LOW (ref 7.350–7.450)
pH, Arterial: 7.336 — ABNORMAL LOW (ref 7.350–7.450)
pO2, Arterial: 155 mmHg — ABNORMAL HIGH (ref 83.0–108.0)
pO2, Arterial: 120 mmHg — ABNORMAL HIGH (ref 83.0–108.0)

## 2018-11-14 LAB — RAPID URINE DRUG SCREEN, HOSP PERFORMED
Amphetamines: NOT DETECTED
Barbiturates: NOT DETECTED
Benzodiazepines: POSITIVE — AB
Cocaine: NOT DETECTED
Opiates: NOT DETECTED
Tetrahydrocannabinol: NOT DETECTED

## 2018-11-14 LAB — URINALYSIS, ROUTINE W REFLEX MICROSCOPIC
Bilirubin Urine: NEGATIVE
Glucose, UA: NEGATIVE mg/dL
Ketones, ur: NEGATIVE mg/dL
Leukocytes,Ua: NEGATIVE
Nitrite: NEGATIVE
Protein, ur: 30 mg/dL — AB
Specific Gravity, Urine: 1.021 (ref 1.005–1.030)
pH: 5 (ref 5.0–8.0)

## 2018-11-14 LAB — GLUCOSE, CAPILLARY: Glucose-Capillary: 121 mg/dL — ABNORMAL HIGH (ref 70–99)

## 2018-11-14 LAB — LACTIC ACID, PLASMA: Lactic Acid, Venous: 0.8 mmol/L (ref 0.5–1.9)

## 2018-11-14 LAB — PREPARE RBC (CROSSMATCH)

## 2018-11-14 LAB — ABO/RH: ABO/RH(D): B NEG

## 2018-11-14 LAB — TRIGLYCERIDES: Triglycerides: 65 mg/dL (ref <150)

## 2018-11-14 MED ORDER — NOREPINEPHRINE 4 MG/250ML-% IV SOLN
0.0000 ug/min | INTRAVENOUS | Status: DC
  Filled 2018-11-14: qty 250

## 2018-11-14 MED ORDER — SODIUM CHLORIDE 0.9% FLUSH
10.0000 mL | Freq: Two times a day (BID) | INTRAVENOUS | Status: DC
  Administered 2018-11-14: 40 mL
  Administered 2018-11-14 – 2018-11-16 (×3): 10 mL
  Administered 2018-11-17: 20 mL
  Administered 2018-11-17 – 2018-11-25 (×15): 10 mL

## 2018-11-14 MED ORDER — ORAL CARE MOUTH RINSE
15.0000 mL | OROMUCOSAL | Status: DC
  Administered 2018-11-14 – 2018-11-16 (×24): 15 mL via OROMUCOSAL

## 2018-11-14 MED ORDER — CHLORHEXIDINE GLUCONATE CLOTH 2 % EX PADS: 6.0000 | MEDICATED_PAD | Freq: Every day | CUTANEOUS | Status: DC

## 2018-11-14 MED ORDER — CHLORHEXIDINE GLUCONATE CLOTH 2 % EX PADS
6.0000 | MEDICATED_PAD | Freq: Every day | CUTANEOUS | Status: DC
  Administered 2018-11-14 – 2018-11-15 (×2): 6 via TOPICAL

## 2018-11-14 MED ORDER — SODIUM CHLORIDE 0.9% FLUSH: 10.0000 mL | INTRAVENOUS | Status: DC | PRN

## 2018-11-14 MED ORDER — CHLORHEXIDINE GLUCONATE 0.12% ORAL RINSE (MEDLINE KIT)
15.0000 mL | Freq: Two times a day (BID) | OROMUCOSAL | Status: DC
  Administered 2018-11-14 – 2018-11-16 (×4): 15 mL via OROMUCOSAL
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
## 2018-11-14 NOTE — Progress Notes (Signed)
Reviewed pt's additional  chart (MRN: [id number]), listed emergency contact is nephew, Aretta Nip. Called at [phone], he confirmed his relationship with pt. He also said pt has one living brother who lives in [place] [phone].
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
## 2018-11-14 NOTE — Procedures (Signed)
History: 63 year old female with altered mental status  Sedation: Precedex given up until follow-up for EEG, Ativan given earlier  Technique: This is a 21 channel routine scalp EEG performed at the bedside with bipolar and monopolar montages arranged in accordance to the international 10/20 system of electrode placement. One channel was dedicated to EKG recording.    Background: The background consists of predominantly of bifrontally predominant beta activity.  There are also occasional runs resembling sleep spindles.  With stimulation, there is an arousal pattern with a posterior dominant rhythm seen of 8 to 9 Hz which rapidly attenuates again resembling sleep.  Photic stimulation: Physiologic driving is not performed  EEG Abnormalities: 1) excess beta activity 2) paucity of waking state  Clinical Interpretation: This EEG is consistent with the patient's sedated state. There was no seizure or seizure predisposition recorded on this study. Please note that lack of epileptiform activity on EEG does not preclude the possibility of epilepsy.   Roland Rack, MD [facility] [phone]  If 7pm- 7am, please page neurology on call as listed in [place].
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
## 2018-11-14 NOTE — Progress Notes (Signed)
Propofol slowly titrated down from 40. Pt woke to verbal stimuli, moderately agitated but did not attempt to pull tubes. Moved all 4 extremities to command, shook head yes/no appropriately to questions. Propofol increased to 40mcg from 55mcg due to dyssynchrony, Precedex added d/t ETOH withdrawal concerns. Leonel Ramsay, MD updated.
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
## 2018-11-14 NOTE — Progress Notes (Signed)
Pharmacy is unable to confirm the medications the patient was taking at home. All options have been exhausted and a resolution to the situation is not expected.   Where possible, their outpatient pharmacy(s) have been contacted for the last time prescriptions were filled and that information has been added to each medication in an Order Note (highlighted yellow below the medication).  Please contact pharmacy if further assistance is needed.   Romeo Rabon, PharmD. Mobile: [phone]. 11/14/2018,3:59 PM.

## 2018-11-14 NOTE — Progress Notes (Signed)
Per family (sister Ebony Hail and brother Gwyndolyn Saxon), verbal given to have Aretta Nip (nephew) make all decisions concerning medical care. Patient will go for surgery tomorrow morning, informed consent received from [facility] and placed in patients chart.
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
## 2018-11-14 NOTE — Progress Notes (Signed)
[facility] Progress Note Patient Name: RIELY OETKEN DOB: May 19, 1956 MRN: [id number]   Date of Service  11/14/2018  HPI/Events of Note  ?EEG showing awake state with paralytic on, Neuro started Propofol but now B/P low 77/55(64)   eICU Interventions  Ordered Levophed.  Maintain map more than 65 or systolic more than 90, either or.     Intervention Category Major Interventions: Hypotension - evaluation and management Intermediate Interventions: Communication with other healthcare providers and/or family;Medication change / dose adjustment  Mady Gemma 11/14/2018, 12:50 AM

## 2018-11-14 NOTE — Progress Notes (Signed)
Pt has only produced 175 mLs of urine via Foley since arrival to 4N, almost 5L positive. [place] notified.

## 2018-11-15 ENCOUNTER — Inpatient Hospital Stay (HOSPITAL_COMMUNITY): Payer: BLUE CROSS/BLUE SHIELD

## 2018-11-15 ENCOUNTER — Encounter (HOSPITAL_COMMUNITY): Admission: EM | Disposition: A | Discharge status: Rehabilitation Facility | Payer: Self-pay | Source: Home / Self Care

## 2018-11-15 ENCOUNTER — Inpatient Hospital Stay (HOSPITAL_COMMUNITY): Payer: BLUE CROSS/BLUE SHIELD | Admitting: Certified Registered Nurse Anesthetist

## 2018-11-15 HISTORY — PX: TIBIA IM NAIL INSERTION: SHX2516

## 2018-11-15 LAB — GLUCOSE, CAPILLARY
Glucose-Capillary: 116 mg/dL — ABNORMAL HIGH (ref 70–99)
Glucose-Capillary: 68 mg/dL — ABNORMAL LOW (ref 70–99)
Glucose-Capillary: 83 mg/dL (ref 70–99)
Glucose-Capillary: 90 mg/dL (ref 70–99)
Glucose-Capillary: 96 mg/dL (ref 70–99)

## 2018-11-15 LAB — POCT I-STAT EG7
Acid-base deficit: 6 mmol/L — ABNORMAL HIGH (ref 0.0–2.0)
Bicarbonate: 20.7 mmol/L (ref 20.0–28.0)
Calcium, Ion: 1.05 mmol/L — ABNORMAL LOW (ref 1.15–1.40)
HCT: 23 % — ABNORMAL LOW (ref 36.0–46.0)
Hemoglobin: 7.8 g/dL — ABNORMAL LOW (ref 12.0–15.0)
O2 Saturation: 83 %
Potassium: 3.4 mmol/L — ABNORMAL LOW (ref 3.5–5.1)
Sodium: 142 mmol/L (ref 135–145)
TCO2: 22 mmol/L (ref 22–32)
pCO2, Ven: 45.3 mmHg (ref 44.0–60.0)
pH, Ven: 7.268 (ref 7.250–7.430)
pO2, Ven: 54 mmHg — ABNORMAL HIGH (ref 32.0–45.0)

## 2018-11-15 LAB — CBC
HCT: 27.4 % — ABNORMAL LOW (ref 36.0–46.0)
Hemoglobin: 8.4 g/dL — ABNORMAL LOW (ref 12.0–15.0)
MCH: 33.1 pg (ref 26.0–34.0)
MCHC: 30.7 g/dL (ref 30.0–36.0)
MCV: 107.9 fL — ABNORMAL HIGH (ref 80.0–100.0)
Platelets: 155 10*3/uL (ref 150–400)
RBC: 2.54 MIL/uL — ABNORMAL LOW (ref 3.87–5.11)
RDW: 20.9 % — ABNORMAL HIGH (ref 11.5–15.5)
WBC: 7.7 10*3/uL (ref 4.0–10.5)
nRBC: 0 % (ref 0.0–0.2)

## 2018-11-15 LAB — BASIC METABOLIC PANEL
Anion gap: 8 (ref 5–15)
BUN: 17 mg/dL (ref 8–23)
CO2: 21 mmol/L — ABNORMAL LOW (ref 22–32)
Calcium: 7.2 mg/dL — ABNORMAL LOW (ref 8.9–10.3)
Chloride: 113 mmol/L — ABNORMAL HIGH (ref 98–111)
Creatinine, Ser: 1.09 mg/dL — ABNORMAL HIGH (ref 0.44–1.00)
GFR calc Af Amer: >60 mL/min (ref >60)
GFR calc non Af Amer: 54 mL/min — ABNORMAL LOW (ref >60)
Glucose, Bld: 85 mg/dL (ref 70–99)
Potassium: 3.3 mmol/L — ABNORMAL LOW (ref 3.5–5.1)
Sodium: 142 mmol/L (ref 135–145)

## 2018-11-15 LAB — POCT I-STAT 4, (NA,K, GLUC, HGB,HCT)
Glucose, Bld: 122 mg/dL — ABNORMAL HIGH (ref 70–99)
HCT: 23 % — ABNORMAL LOW (ref 36.0–46.0)
Hemoglobin: 7.8 g/dL — ABNORMAL LOW (ref 12.0–15.0)
Potassium: 3.1 mmol/L — ABNORMAL LOW (ref 3.5–5.1)
Sodium: 143 mmol/L (ref 135–145)

## 2018-11-15 LAB — SURGICAL PCR SCREEN
MRSA, PCR: NEGATIVE
Staphylococcus aureus: NEGATIVE

## 2018-11-15 LAB — PREPARE RBC (CROSSMATCH)

## 2018-11-15 LAB — LACTIC ACID, PLASMA: Lactic Acid, Venous: 0.8 mmol/L (ref 0.5–1.9)

## 2018-11-15 SURGERY — INSERTION, INTRAMEDULLARY ROD, TIBIA
Anesthesia: General | Laterality: Right

## 2018-11-15 MED ORDER — SODIUM CHLORIDE 0.9 % IR SOLN
Status: DC | PRN
  Administered 2018-11-15 (×2): 3000 mL

## 2018-11-15 MED ORDER — LORAZEPAM 2 MG/ML IJ SOLN
2.0000 mg | INTRAMUSCULAR | Status: DC | PRN
  Administered 2018-11-16: 2 mg via INTRAVENOUS
  Filled 2018-11-15: qty 1

## 2018-11-15 MED ORDER — PROPOFOL 10 MG/ML IV BOLUS
INTRAVENOUS | Status: DC | PRN
  Administered 2018-11-15: 50 mg via INTRAVENOUS

## 2018-11-15 MED ORDER — FENTANYL CITRATE (PF) 100 MCG/2ML IJ SOLN: 50.0000 ug | Freq: Once | INTRAMUSCULAR | Status: DC

## 2018-11-15 MED ORDER — 0.9 % SODIUM CHLORIDE (POUR BTL) OPTIME
TOPICAL | Status: DC | PRN
  Administered 2018-11-15: 1000 mL

## 2018-11-15 MED ORDER — PHENYLEPHRINE HCL (PRESSORS) 10 MG/ML IV SOLN
INTRAVENOUS | Status: DC | PRN
  Administered 2018-11-15: 80 ug via INTRAVENOUS

## 2018-11-15 MED ORDER — FENTANYL BOLUS VIA INFUSION
50.0000 ug | INTRAVENOUS | Status: DC | PRN
  Filled 2018-11-15: qty 50

## 2018-11-15 MED ORDER — ROCURONIUM BROMIDE 50 MG/5ML IV SOSY
PREFILLED_SYRINGE | INTRAVENOUS | Status: DC | PRN
  Administered 2018-11-15: 50 mg via INTRAVENOUS
  Administered 2018-11-15 (×3): 10 mg via INTRAVENOUS

## 2018-11-15 MED ORDER — SODIUM CHLORIDE 0.9% IV SOLUTION: Freq: Once | INTRAVENOUS | Status: DC

## 2018-11-15 MED ORDER — PROPOFOL 10 MG/ML IV BOLUS
INTRAVENOUS | Status: AC
  Filled 2018-11-15: qty 20

## 2018-11-15 MED ORDER — DEXAMETHASONE SODIUM PHOSPHATE 4 MG/ML IJ SOLN
INTRAMUSCULAR | Status: DC | PRN
  Administered 2018-11-15: 5 mg via INTRAVENOUS

## 2018-11-15 MED ORDER — PIPERACILLIN-TAZOBACTAM 3.375 G IVPB
3.3750 g | Freq: Three times a day (TID) | INTRAVENOUS | Status: DC
  Administered 2018-11-15 – 2018-11-17 (×5): 3.375 g via INTRAVENOUS
  Filled 2018-11-15 (×5): qty 50

## 2018-11-15 MED ORDER — DEXTROSE 50 % IV SOLN
12.5000 g | Freq: Once | INTRAVENOUS | Status: AC
  Administered 2018-11-15: 12.5 g via INTRAVENOUS

## 2018-11-15 MED ORDER — FENTANYL 2500MCG IN NS 250ML (10MCG/ML) PREMIX INFUSION
50.0000 ug/h | INTRAVENOUS | Status: DC
  Administered 2018-11-15: 150 ug/h via INTRAVENOUS
  Administered 2018-11-16: 200 ug/h via INTRAVENOUS
  Filled 2018-11-15 (×2): qty 250

## 2018-11-15 MED ORDER — DEXTROSE 50 % IV SOLN
INTRAVENOUS | Status: DC | PRN
  Administered 2018-11-15: 30 mL via INTRAVENOUS

## 2018-11-15 MED ORDER — LACTATED RINGERS IV SOLN
INTRAVENOUS | Status: DC | PRN
  Administered 2018-11-15: 08:00:00 via INTRAVENOUS

## 2018-11-15 MED ORDER — FENTANYL CITRATE (PF) 250 MCG/5ML IJ SOLN
INTRAMUSCULAR | Status: AC
  Filled 2018-11-15: qty 5

## 2018-11-15 MED ORDER — DEXTROSE 50 % IV SOLN
INTRAVENOUS | Status: AC
  Filled 2018-11-15: qty 50

## 2018-11-15 MED ORDER — MIDAZOLAM HCL 2 MG/2ML IJ SOLN
INTRAMUSCULAR | Status: AC
  Filled 2018-11-15: qty 2

## 2018-11-15 MED ORDER — SODIUM CHLORIDE 0.9 % IV SOLN
INTRAVENOUS | Status: DC | PRN
  Administered 2018-11-15: 09:00:00 25 ug/min via INTRAVENOUS

## 2018-11-15 MED ORDER — ONDANSETRON HCL 4 MG/2ML IJ SOLN
INTRAMUSCULAR | Status: DC | PRN
  Administered 2018-11-15: 4 mg via INTRAVENOUS

## 2018-11-15 MED ORDER — FENTANYL CITRATE (PF) 250 MCG/5ML IJ SOLN
INTRAMUSCULAR | Status: DC | PRN
  Administered 2018-11-15 (×3): 50 ug via INTRAVENOUS

## 2018-11-15 MED ORDER — ENOXAPARIN SODIUM 40 MG/0.4ML ~~LOC~~ SOLN
40.0000 mg | SUBCUTANEOUS | Status: DC
  Administered 2018-11-16 – 2018-11-25 (×10): 40 mg via SUBCUTANEOUS
  Filled 2018-11-15 (×10): qty 0.4

## 2018-11-15 MED ORDER — DIPHENHYDRAMINE HCL 50 MG/ML IJ SOLN
25.0000 mg | Freq: Four times a day (QID) | INTRAMUSCULAR | Status: DC | PRN
  Administered 2018-11-15 – 2018-11-22 (×2): 25 mg via INTRAVENOUS
  Filled 2018-11-15 (×2): qty 1

## 2018-11-15 SURGICAL SUPPLY — 61 items
BANDAGE ACE 4X5 VEL STRL LF (GAUZE/BANDAGES/DRESSINGS) ×2 IMPLANT
BANDAGE ACE 6X5 VEL STRL LF (GAUZE/BANDAGES/DRESSINGS) ×2 IMPLANT
BIT DRILL CALIBRATED 4.3X320MM (BIT) ×1 IMPLANT
BIT DRILL CANN 7X200 (BIT) ×2 IMPLANT
BIT DRILL CROWE POINT TWST 4.3 (DRILL) ×1 IMPLANT
BLADE SURG 10 STRL SS (BLADE) ×2 IMPLANT
BNDG ELASTIC 6X10 VLCR STRL LF (GAUZE/BANDAGES/DRESSINGS) ×2 IMPLANT
BNDG GAUZE ELAST 4 BULKY (GAUZE/BANDAGES/DRESSINGS) ×2 IMPLANT
BRUSH SCRUB SURG 4.25 DISP (MISCELLANEOUS) ×4 IMPLANT
COVER SURGICAL LIGHT HANDLE (MISCELLANEOUS) ×4 IMPLANT
COVER WAND RF STERILE (DRAPES) ×2 IMPLANT
DRAPE C-ARM 42X72 X-RAY (DRAPES) ×2 IMPLANT
DRAPE C-ARMOR (DRAPES) ×2 IMPLANT
DRAPE HALF SHEET 40X57 (DRAPES) IMPLANT
DRAPE INCISE IOBAN 66X45 STRL (DRAPES) IMPLANT
DRAPE U-SHAPE 47X51 STRL (DRAPES) ×2 IMPLANT
DRILL CALIBRATED 4.3X320MM (BIT) ×2
DRILL CROWE POINT TWIST 4.3 (DRILL) ×2
DRSG ADAPTIC 3X8 NADH LF (GAUZE/BANDAGES/DRESSINGS) ×2 IMPLANT
DRSG MEPITEL 4X7.2 (GAUZE/BANDAGES/DRESSINGS) ×2 IMPLANT
DRSG VAC ATS LRG SENSATRAC (GAUZE/BANDAGES/DRESSINGS) ×2 IMPLANT
ELECT REM PT RETURN 9FT ADLT (ELECTROSURGICAL) ×2
ELECTRODE REM PT RTRN 9FT ADLT (ELECTROSURGICAL) ×1 IMPLANT
GAUZE SPONGE 4X4 12PLY STRL (GAUZE/BANDAGES/DRESSINGS) ×2 IMPLANT
GLOVE BIO SURGEON STRL SZ7.5 (GLOVE) ×2 IMPLANT
GLOVE BIO SURGEON STRL SZ8 (GLOVE) ×2 IMPLANT
GLOVE BIO SURGEON STRL SZ8.5 (GLOVE) ×2 IMPLANT
GLOVE BIOGEL PI IND STRL 7.5 (GLOVE) ×1 IMPLANT
GLOVE BIOGEL PI IND STRL 8 (GLOVE) ×1 IMPLANT
GLOVE BIOGEL PI INDICATOR 7.5 (GLOVE) ×1
GLOVE BIOGEL PI INDICATOR 8 (GLOVE) ×1
GOWN STRL REUS W/ TWL LRG LVL3 (GOWN DISPOSABLE) ×2 IMPLANT
GOWN STRL REUS W/ TWL XL LVL3 (GOWN DISPOSABLE) ×1 IMPLANT
GOWN STRL REUS W/TWL LRG LVL3 (GOWN DISPOSABLE) ×2
GOWN STRL REUS W/TWL XL LVL3 (GOWN DISPOSABLE) ×1
GUIDEPIN 3.2X17.5 THRD DISP (PIN) ×2 IMPLANT
GUIDEWIRE 2.6X80 BEAD TIP (WIRE) ×1 IMPLANT
GUIDWIRE 2.6X80 BEAD TIP (WIRE) ×2
KIT BASIN OR (CUSTOM PROCEDURE TRAY) ×2 IMPLANT
KIT INFUSE LRG II (Orthopedic Implant) ×2 IMPLANT
KIT TURNOVER KIT B (KITS) ×2 IMPLANT
NAIL LOCK ANKLE 10X240 (Nail) ×2 IMPLANT
PACK ORTHO EXTREMITY (CUSTOM PROCEDURE TRAY) ×2 IMPLANT
PAD ABD 8X10 STRL (GAUZE/BANDAGES/DRESSINGS) ×2 IMPLANT
PAD ARMBOARD 7.5X6 YLW CONV (MISCELLANEOUS) ×4 IMPLANT
SCREW CORT TI DBL LEAD 5X22 (Screw) ×4 IMPLANT
SCREW CORT TI DBL LEAD 5X36 (Screw) ×2 IMPLANT
SCREW CORT TI DBL LEAD 5X65 (Screw) ×2 IMPLANT
SET CYSTO W/LG BORE CLAMP LF (SET/KITS/TRAYS/PACK) ×2 IMPLANT
SPONGE LAP 18X18 X RAY DECT (DISPOSABLE) ×2 IMPLANT
STAPLER VISISTAT 35W (STAPLE) ×2 IMPLANT
SUT ETHILON 2 0 PSLX (SUTURE) ×2 IMPLANT
SUT ETHILON 3 0 PS 1 (SUTURE) ×4 IMPLANT
SUT VIC AB 0 CT1 27 (SUTURE)
SUT VIC AB 0 CT1 27XBRD ANBCTR (SUTURE) IMPLANT
SUT VIC AB 2-0 CT1 27 (SUTURE) ×1
SUT VIC AB 2-0 CT1 TAPERPNT 27 (SUTURE) ×1 IMPLANT
TOWEL OR 17X24 6PK STRL BLUE (TOWEL DISPOSABLE) ×2 IMPLANT
TOWEL OR 17X26 10 PK STRL BLUE (TOWEL DISPOSABLE) ×4 IMPLANT
TUBE CONNECTING 12X1/4 (SUCTIONS) ×2 IMPLANT
YANKAUER SUCT BULB TIP NO VENT (SUCTIONS) IMPLANT

## 2018-11-15 NOTE — Transfer of Care (Signed)
Immediate Anesthesia Transfer of Care Note  Patient: Sarah Weeks  Procedure(s) Performed: REPEAT I&D AND INTRAMEDULLARY (IM) NAIL TIBIAL/HINDFOOT FUSION (Right )  Patient Location: ICU  Anesthesia Type:General  Level of Consciousness: Patient remains intubated per anesthesia plan  Airway & Oxygen Therapy: Patient remains intubated per anesthesia plan and Patient placed on Ventilator (see vital sign flow sheet for setting)  Post-op Assessment: Report given to RN and Post -op Vital signs reviewed and stable  Post vital signs: Reviewed and stable  Last Vitals:  Vitals Value Taken Time  BP 129/72 11/15/2018 11:47 AM  Temp    Pulse 73 11/15/2018 11:47 AM  Resp 24 11/15/2018 11:47 AM  SpO2 100 % 11/15/2018 11:47 AM    Last Pain:  Vitals:   11/15/18 0800  TempSrc: Esophageal  PainSc:          Complications: No apparent anesthesia complications

## 2018-11-15 NOTE — Progress Notes (Signed)
Orthopedic Tech Progress Note Patient Details:  Sarah Weeks Oct 29, 1955 [id number] OR RN called requesting a PRAFO BOOT. Ortho Devices Type of Ortho Device: Prafo boot/shoe Ortho Device/Splint Location: Level 2 Trauma Ortho Device/Splint Interventions: Other (comment)   Post Interventions Patient Tolerated: Other (comment) Instructions Provided: Other (comment)   Janit Pagan 11/15/2018, 10:02 AM

## 2018-11-15 NOTE — Progress Notes (Signed)
Orthopedic Tech Progress Note Patient Details:  Sarah Weeks 06-Dec-1955 [id number] Dropped off PRAFO BOOT for patient while she was in OR @1002  Patient ID: [name], female   DOB: [birth year], 63 y.o.   MRN: [id number]   Janit Pagan 11/15/2018, 3:02 PM

## 2018-11-15 NOTE — Anesthesia Postprocedure Evaluation (Signed)
Anesthesia Post Note  Patient: Sarah Weeks  Procedure(s) Performed: REPEAT I&D AND INTRAMEDULLARY (IM) NAIL TIBIAL/HINDFOOT FUSION (Right )     Patient location during evaluation: SICU Anesthesia Type: General Level of consciousness: sedated Pain management: pain level controlled Vital Signs Assessment: post-procedure vital signs reviewed and stable Respiratory status: patient remains intubated per anesthesia plan Cardiovascular status: stable Postop Assessment: no apparent nausea or vomiting Anesthetic complications: no    Last Vitals:  Vitals:   11/15/18 0800 11/15/18 1147  BP: 137/75 129/72  Pulse:  73  Resp: (!) 24 (!) 24  Temp: 36.6 C   SpO2: 100% 100%    Last Pain:  Vitals:   11/15/18 0800  TempSrc: Esophageal  PainSc:                  Lidia Collum

## 2018-11-15 NOTE — Brief Op Note (Signed)
11/15/2018  11:41 AM  PATIENT:  Sarah Weeks  63 y.o. female  PRE-OPERATIVE DIAGNOSIS:   1. Right open tibia fracture, grade 3 2. Retained hardware right ankle 3. Left calcaneus fracture  POST-OPERATIVE DIAGNOSIS:   1. Right open tibia fracture, grade 3 2. Retained hardware right ankle 3. Left calcaneus fracture  PROCEDURE:  Procedure(s): 1. REPEAT I&D OF OPEN FRACTURE 2. INTRAMEDULLARY (IM) NAILING OF TIBIA WITH TIBIAL/HINDFOOT FUSION NAIL BIOMET 10 X 240 MM STATICALLY LOCKED (Right) 3. Removal of deep implant 4. Removal of external fixator under anesthesia 5. Debridement of pin sites with curettage 6. Wound vac dressing change under anesthesia 7. Closed treatment of left calcaneus fracture with Ronnald Ramp Dressing  SURGEON:  Surgeon(s) and Role:    Altamese Merrill, MD - Primary  PHYSICIAN ASSISTANT: Ainsley Spinner, PA-C  ASSISTANTS: 2. PA Student   ANESTHESIA:   general  EBL:  50 mL   BLOOD ADMINISTERED:none  DRAINS: wound vac   LOCAL MEDICATIONS USED:  NONE  SPECIMEN:  No Specimen  DISPOSITION OF SPECIMEN:  N/A  COUNTS:  YES  TOURNIQUET:  * No tourniquets in log *  DICTATION: .Other Dictation: Dictation Number [id number]  PLAN OF CARE: Admit to inpatient   PATIENT DISPOSITION:  ICU - intubated and hemodynamically stable.   Delay start of Pharmacological VTE agent (>24hrs) due to surgical blood loss or risk of bleeding: no
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
## 2018-11-16 ENCOUNTER — Encounter (HOSPITAL_COMMUNITY): Payer: Self-pay | Admitting: Orthopedic Surgery

## 2018-11-16 DIAGNOSIS — S82391F Other fracture of lower end of right tibia, subsequent encounter for open fracture type IIIA, IIIB, or IIIC with routine healing: Secondary | ICD-10-CM

## 2018-11-16 DIAGNOSIS — M19072 Primary osteoarthritis, left ankle and foot: Secondary | ICD-10-CM

## 2018-11-16 DIAGNOSIS — S92012A Displaced fracture of body of left calcaneus, initial encounter for closed fracture: Secondary | ICD-10-CM

## 2018-11-16 DIAGNOSIS — S82399B Other fracture of lower end of unspecified tibia, initial encounter for open fracture type I or II: Secondary | ICD-10-CM

## 2018-11-16 HISTORY — DX: Primary osteoarthritis, left ankle and foot: M19.072

## 2018-11-16 HISTORY — DX: Other fracture of lower end of unspecified tibia, initial encounter for open fracture type I or II: S82.399B

## 2018-11-16 LAB — BASIC METABOLIC PANEL
Anion gap: 12 (ref 5–15)
BUN: 13 mg/dL (ref 8–23)
CO2: 18 mmol/L — ABNORMAL LOW (ref 22–32)
Calcium: 6.9 mg/dL — ABNORMAL LOW (ref 8.9–10.3)
Chloride: 112 mmol/L — ABNORMAL HIGH (ref 98–111)
Creatinine, Ser: 1.15 mg/dL — ABNORMAL HIGH (ref 0.44–1.00)
GFR calc Af Amer: 59 mL/min — ABNORMAL LOW (ref >60)
GFR calc non Af Amer: 51 mL/min — ABNORMAL LOW (ref >60)
Glucose, Bld: 103 mg/dL — ABNORMAL HIGH (ref 70–99)
Potassium: 3.5 mmol/L (ref 3.5–5.1)
Sodium: 142 mmol/L (ref 135–145)

## 2018-11-16 LAB — CBC
HCT: 24.6 % — ABNORMAL LOW (ref 36.0–46.0)
Hemoglobin: 7.7 g/dL — ABNORMAL LOW (ref 12.0–15.0)
MCH: 33.6 pg (ref 26.0–34.0)
MCHC: 31.3 g/dL (ref 30.0–36.0)
MCV: 107.4 fL — ABNORMAL HIGH (ref 80.0–100.0)
Platelets: 193 10*3/uL (ref 150–400)
RBC: 2.29 MIL/uL — ABNORMAL LOW (ref 3.87–5.11)
RDW: 19.5 % — ABNORMAL HIGH (ref 11.5–15.5)
WBC: 8.8 10*3/uL (ref 4.0–10.5)
nRBC: 0 % (ref 0.0–0.2)

## 2018-11-16 MED ORDER — CHLORHEXIDINE GLUCONATE CLOTH 2 % EX PADS
6.0000 | MEDICATED_PAD | Freq: Every day | CUTANEOUS | Status: DC
  Administered 2018-11-17 – 2018-11-20 (×2): 6 via TOPICAL

## 2018-11-16 MED ORDER — ACETAMINOPHEN 325 MG PO TABS
650.0000 mg | ORAL_TABLET | Freq: Four times a day (QID) | ORAL | Status: DC
  Administered 2018-11-16 – 2018-11-25 (×23): 650 mg via ORAL
  Filled 2018-11-16 (×27): qty 2

## 2018-11-16 MED ORDER — CHLORHEXIDINE GLUCONATE CLOTH 2 % EX PADS
6.0000 | MEDICATED_PAD | Freq: Every day | CUTANEOUS | Status: DC
  Administered 2018-11-18 – 2018-11-19 (×2): 6 via TOPICAL

## 2018-11-16 MED ORDER — ENOXAPARIN SODIUM 40 MG/0.4ML ~~LOC~~ SOLN: 40.0000 mg | SUBCUTANEOUS | Status: DC

## 2018-11-16 MED ORDER — METOPROLOL TARTRATE 5 MG/5ML IV SOLN
5.0000 mg | Freq: Four times a day (QID) | INTRAVENOUS | Status: DC | PRN
  Administered 2018-11-16 – 2018-11-17 (×2): 5 mg via INTRAVENOUS
  Filled 2018-11-16 (×2): qty 5

## 2018-11-16 MED ORDER — CALCIUM CARBONATE ANTACID 500 MG PO CHEW
1.0000 | CHEWABLE_TABLET | Freq: Three times a day (TID) | ORAL | Status: DC | PRN
  Administered 2018-11-16 – 2018-11-18 (×4): 200 mg via ORAL
  Filled 2018-11-16 (×4): qty 1

## 2018-11-16 MED ORDER — HYDROMORPHONE HCL 1 MG/ML IJ SOLN: 0.5000 mg | INTRAMUSCULAR | Status: DC | PRN

## 2018-11-16 MED ORDER — OXYCODONE HCL 5 MG PO TABS
5.0000 mg | ORAL_TABLET | ORAL | Status: DC | PRN
  Administered 2018-11-17 – 2018-11-20 (×3): 5 mg via ORAL
  Filled 2018-11-16 (×4): qty 1

## 2018-11-16 NOTE — Progress Notes (Signed)
Orthopedic Tech Progress Note Patient Details:  Sarah Weeks [birth year] [id number]  Patient ID: Sarah Weeks, female   DOB: 18-Jun-1955, 63 y.o.   MRN: [id number]   [name] 11/16/2018, 12:17 PMCalled [employer] for brace.

## 2018-11-16 NOTE — Procedures (Signed)
Extubation Procedure Note  Patient Details:   Name: Sarah Weeks DOB: 07/04/1955 MRN: [id number]   Airway Documentation:    Vent end date: 11/16/18 Vent end time: 1220   Evaluation  O2 sats: stable throughout Complications: No apparent complications Patient did tolerate procedure well. Bilateral Breath Sounds: Clear, Diminished   Yes   Pt extubated to 2L N/C.  No stridor noted.  RN @ bedside.  Donnetta Hail 11/16/2018, 12:22 PM
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
## 2018-11-17 DIAGNOSIS — F1020 Alcohol dependence, uncomplicated: Secondary | ICD-10-CM

## 2018-11-17 DIAGNOSIS — F10288 Alcohol dependence with other alcohol-induced disorder: Secondary | ICD-10-CM

## 2018-11-17 DIAGNOSIS — R443 Hallucinations, unspecified: Secondary | ICD-10-CM

## 2018-11-17 LAB — BASIC METABOLIC PANEL
Anion gap: 10 (ref 5–15)
BUN: 11 mg/dL (ref 8–23)
CO2: 21 mmol/L — ABNORMAL LOW (ref 22–32)
Calcium: 7.3 mg/dL — ABNORMAL LOW (ref 8.9–10.3)
Chloride: 111 mmol/L (ref 98–111)
Creatinine, Ser: 1.15 mg/dL — ABNORMAL HIGH (ref 0.44–1.00)
GFR calc Af Amer: 59 mL/min — ABNORMAL LOW (ref >60)
GFR calc non Af Amer: 51 mL/min — ABNORMAL LOW (ref >60)
Glucose, Bld: 91 mg/dL (ref 70–99)
Potassium: 3.2 mmol/L — ABNORMAL LOW (ref 3.5–5.1)
Sodium: 142 mmol/L (ref 135–145)

## 2018-11-17 LAB — CBC
HCT: 24.9 % — ABNORMAL LOW (ref 36.0–46.0)
Hemoglobin: 7.8 g/dL — ABNORMAL LOW (ref 12.0–15.0)
MCH: 33.5 pg (ref 26.0–34.0)
MCHC: 31.3 g/dL (ref 30.0–36.0)
MCV: 106.9 fL — ABNORMAL HIGH (ref 80.0–100.0)
Platelets: 226 10*3/uL (ref 150–400)
RBC: 2.33 MIL/uL — ABNORMAL LOW (ref 3.87–5.11)
RDW: 18.8 % — ABNORMAL HIGH (ref 11.5–15.5)
WBC: 7.9 10*3/uL (ref 4.0–10.5)
nRBC: 0 % (ref 0.0–0.2)

## 2018-11-17 LAB — BPAM RBC
Blood Product Expiration Date: 202006062359
Blood Product Expiration Date: 202006152359
Blood Product Expiration Date: 202006172359
ISSUE DATE / TIME: 202006010054
Unit Type and Rh: 1700
Unit Type and Rh: 1700
Unit Type and Rh: 9500

## 2018-11-17 LAB — CALCITRIOL (1,25 DI-OH VIT D): Vit D, 1,25-Dihydroxy: 68 pg/mL (ref 19.9–79.3)

## 2018-11-17 LAB — TYPE AND SCREEN
ABO/RH(D): B NEG
Antibody Screen: NEGATIVE
Unit division: 0
Unit division: 0
Unit division: 0

## 2018-11-17 LAB — VITAMIN B1: Vitamin B1 (Thiamine): 138.9 nmol/L (ref 66.5–200.0)

## 2018-11-17 LAB — MAGNESIUM: Magnesium: 1.1 mg/dL — ABNORMAL LOW (ref 1.7–2.4)

## 2018-11-17 LAB — VITAMIN D 25 HYDROXY (VIT D DEFICIENCY, FRACTURES): Vit D, 25-Hydroxy: 30.8 ng/mL (ref 30.0–100.0)

## 2018-11-17 MED ORDER — BETHANECHOL CHLORIDE 10 MG PO TABS
25.0000 mg | ORAL_TABLET | Freq: Three times a day (TID) | ORAL | Status: DC
  Administered 2018-11-17 – 2018-11-18 (×5): 25 mg via ORAL
  Filled 2018-11-17 (×5): qty 3

## 2018-11-17 MED ORDER — ZOLPIDEM TARTRATE 5 MG PO TABS
5.0000 mg | ORAL_TABLET | Freq: Every day | ORAL | Status: DC
  Administered 2018-11-17 – 2018-11-24 (×8): 5 mg via ORAL
  Filled 2018-11-17 (×8): qty 1

## 2018-11-17 MED ORDER — HALOPERIDOL 2 MG PO TABS
2.0000 mg | ORAL_TABLET | Freq: Four times a day (QID) | ORAL | Status: DC | PRN
  Filled 2018-11-17: qty 1

## 2018-11-17 MED ORDER — FUROSEMIDE 40 MG PO TABS
40.0000 mg | ORAL_TABLET | Freq: Every day | ORAL | Status: DC
  Administered 2018-11-17: 40 mg via ORAL
  Filled 2018-11-17 (×6): qty 1

## 2018-11-17 MED ORDER — TRAZODONE HCL 50 MG PO TABS
50.0000 mg | ORAL_TABLET | Freq: Every day | ORAL | Status: DC
  Administered 2018-11-18 – 2018-11-24 (×5): 50 mg via ORAL
  Filled 2018-11-17 (×6): qty 1

## 2018-11-17 MED ORDER — ALPRAZOLAM 0.25 MG PO TABS
0.2500 mg | ORAL_TABLET | Freq: Two times a day (BID) | ORAL | Status: DC
  Administered 2018-11-17 – 2018-11-25 (×17): 0.25 mg via ORAL
  Filled 2018-11-17 (×17): qty 1

## 2018-11-17 MED ORDER — POTASSIUM CHLORIDE CRYS ER 20 MEQ PO TBCR
40.0000 meq | EXTENDED_RELEASE_TABLET | Freq: Two times a day (BID) | ORAL | Status: AC
  Administered 2018-11-17 (×2): 40 meq via ORAL
  Filled 2018-11-17 (×2): qty 2

## 2018-11-17 MED ORDER — POLYETHYLENE GLYCOL 3350 17 G PO PACK
17.0000 g | PACK | Freq: Every day | ORAL | Status: DC
  Administered 2018-11-17 – 2018-11-18 (×2): 17 g via ORAL
  Filled 2018-11-17 (×5): qty 1

## 2018-11-17 MED ORDER — ESCITALOPRAM OXALATE 10 MG PO TABS
10.0000 mg | ORAL_TABLET | Freq: Every day | ORAL | Status: DC
  Administered 2018-11-17 – 2018-11-25 (×9): 10 mg via ORAL
  Filled 2018-11-17 (×9): qty 1

## 2018-11-17 MED ORDER — AMITRIPTYLINE HCL 25 MG PO TABS
50.0000 mg | ORAL_TABLET | Freq: Every day | ORAL | Status: DC
  Administered 2018-11-18 – 2018-11-24 (×7): 50 mg via ORAL
  Filled 2018-11-17 (×7): qty 2

## 2018-11-17 MED ORDER — METOPROLOL SUCCINATE ER 25 MG PO TB24
25.0000 mg | ORAL_TABLET | Freq: Every day | ORAL | Status: DC
  Administered 2018-11-17 – 2018-11-22 (×6): 25 mg via ORAL
  Filled 2018-11-17 (×7): qty 1

## 2018-11-17 NOTE — Progress Notes (Addendum)
Subjective: Patient reports "Good morning. I feel pretty good"  Objective: Vital signs in last 24 hours: Temp:  [97.3 F (36.3 C)-98.4 F (36.9 C)] 98.1 F (36.7 C) (06/04 0400) Pulse Rate:  [67-139] 123 (06/04 0600) Resp:  [12-28] 22 (06/04 0600) BP: (92-146)/(62-99) 146/95 (06/04 0600) SpO2:  [87 %-100 %] 94 % (06/04 0600) FiO2 (%):  [30 %] 30 % (06/03 1100)  Intake/Output from previous day: 06/03 0701 - 06/04 0700 In: 2016.4 [I.V.:1893.8; IV Piggyback:122.5] Out: 475 [Urine:475] Intake/Output this shift: No intake/output data recorded.  Awake, following commands all extremities. Reports only mild right leg discomfort. Answers questions appropriately with this visit, but Nursing notes hallucinations and delusions overnight. Reportedly, her son had noticed similar behavior and took pt to ER prior to this admission.  Acknowledges L5 fracture and verbalizes understanding of need for LSO when mobilizing. Denies back pain.    Lab Results: Recent Labs    11/16/18 0424 11/17/18 0609  WBC 8.8 7.9  HGB 7.7* 7.8*  HCT 24.6* 24.9*  PLT 193 226   BMET Recent Labs    11/16/18 0424 11/17/18 0609  NA 142 142  K 3.5 3.2*  CL 112* 111  CO2 18* 21*  GLUCOSE 103* 91  BUN 13 11  CREATININE 1.15* 1.15*  CALCIUM 6.9* 7.3*    Studies/Results: Dg Ankle Complete Right  Result Date: 11/15/2018 CLINICAL DATA:  ORIF. EXAM: RIGHT ANKLE - COMPLETE 3+ VIEW COMPARISON:  11/15/2018. FINDINGS: Postsurgical changes noted of the lower extremity. Screws and rod intact. Fractures of the distal tibia and fibula aligned. IMPRESSION: Postsurgical changes with hardware intact and anatomic alignment. Electronically Signed   By: Marcello Moores  Register   On: 11/15/2018 16:05   Dg Ankle Complete Right  Result Date: 11/15/2018 CLINICAL DATA:  Fractures of the distal tibia and fibula. Prior ankle arthrodesis. EXAM: DG C-ARM 61-120 MIN; RIGHT ANKLE - COMPLETE 3+ VIEW COMPARISON:  CT scan dated 11/14/2018 and  radiographs dated 11/13/2018 FINDINGS: Multiple C-arm images demonstrate the patient undergoing open reduction internal fixation of the distal tibia fracture. Intramedullary nail inserted in the tibia and through the fused tibiotalar joint and into the calcaneus across the subtalar joint. Proximal and distal fixation screws in place. Alignment and position of the distal tibia and fibula fractures appears essentially anatomic. IMPRESSION: Open reduction and internal fixation of the distal tibia fracture as described. FLUOROSCOPY TIME:  1 minutes 23 seconds C-arm fluoroscopic images were obtained intraoperatively and submitted for post operative interpretation. Electronically Signed   By: Lorriane Shire M.D.   On: 11/15/2018 11:31   Dg C-arm 1-60 Min  Result Date: 11/15/2018 CLINICAL DATA:  Fractures of the distal tibia and fibula. Prior ankle arthrodesis. EXAM: DG C-ARM 61-120 MIN; RIGHT ANKLE - COMPLETE 3+ VIEW COMPARISON:  CT scan dated 11/14/2018 and radiographs dated 11/13/2018 FINDINGS: Multiple C-arm images demonstrate the patient undergoing open reduction internal fixation of the distal tibia fracture. Intramedullary nail inserted in the tibia and through the fused tibiotalar joint and into the calcaneus across the subtalar joint. Proximal and distal fixation screws in place. Alignment and position of the distal tibia and fibula fractures appears essentially anatomic. IMPRESSION: Open reduction and internal fixation of the distal tibia fracture as described. FLUOROSCOPY TIME:  1 minutes 23 seconds C-arm fluoroscopic images were obtained intraoperatively and submitted for post operative interpretation. Electronically Signed   By: Lorriane Shire M.D.   On: 11/15/2018 11:31    Assessment/Plan:   LOS: 4 days  LSO when cleared  to mobilize. Expecting transfer to another facility due to insurance.    Sarah Weeks 11/17/2018, 7:12 AM   Patient to be transferred for insurance reasons.
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
## 2018-11-17 NOTE — Progress Notes (Signed)
Orthopedic Tech Progress Note Patient Details:  Sarah Weeks 12-13-1955 [id number]  Ortho Devices Type of Ortho Device: CAM walker Ortho Device/Splint Location: left Ortho Device/Splint Interventions: Application   Post Interventions Patient Tolerated: Well Instructions Provided: Care of device   [name] 11/17/2018, 11:18 AM

## 2018-11-18 LAB — CBC
HCT: 25.5 % — ABNORMAL LOW (ref 36.0–46.0)
Hemoglobin: 8.1 g/dL — ABNORMAL LOW (ref 12.0–15.0)
MCH: 33.3 pg (ref 26.0–34.0)
MCHC: 31.8 g/dL (ref 30.0–36.0)
MCV: 104.9 fL — ABNORMAL HIGH (ref 80.0–100.0)
Platelets: 242 10*3/uL (ref 150–400)
RBC: 2.43 MIL/uL — ABNORMAL LOW (ref 3.87–5.11)
RDW: 18.1 % — ABNORMAL HIGH (ref 11.5–15.5)
WBC: 7.7 10*3/uL (ref 4.0–10.5)
nRBC: 0 % (ref 0.0–0.2)

## 2018-11-18 LAB — BASIC METABOLIC PANEL
Anion gap: 7 (ref 5–15)
BUN: 5 mg/dL — ABNORMAL LOW (ref 8–23)
CO2: 25 mmol/L (ref 22–32)
Calcium: 7.5 mg/dL — ABNORMAL LOW (ref 8.9–10.3)
Chloride: 110 mmol/L (ref 98–111)
Creatinine, Ser: 0.72 mg/dL (ref 0.44–1.00)
GFR calc Af Amer: >60 mL/min (ref >60)
GFR calc non Af Amer: >60 mL/min (ref >60)
Glucose, Bld: 90 mg/dL (ref 70–99)
Potassium: 3.6 mmol/L (ref 3.5–5.1)
Sodium: 142 mmol/L (ref 135–145)

## 2018-11-18 MED ORDER — VITAMIN D 25 MCG (1000 UNIT) PO TABS
2000.0000 [IU] | ORAL_TABLET | Freq: Two times a day (BID) | ORAL | Status: DC
  Administered 2018-11-18 – 2018-11-25 (×15): 2000 [IU] via ORAL
  Filled 2018-11-18 (×15): qty 2

## 2018-11-18 MED ORDER — ASCORBIC ACID 500 MG PO TABS: 500.0000 mg | ORAL_TABLET | Freq: Every day | ORAL | 0 refills | Status: AC

## 2018-11-18 MED ORDER — PANTOPRAZOLE SODIUM 40 MG PO TBEC
40.0000 mg | DELAYED_RELEASE_TABLET | Freq: Every day | ORAL | Status: DC
  Administered 2018-11-18 – 2018-11-25 (×8): 40 mg via ORAL
  Filled 2018-11-18 (×8): qty 1

## 2018-11-18 MED ORDER — ALUM & MAG HYDROXIDE-SIMETH 200-200-20 MG/5ML PO SUSP: 30.0000 mL | ORAL | Status: DC | PRN

## 2018-11-18 MED ORDER — VITAMIN D3 125 MCG (5000 UT) PO TABS: 1.0000 | ORAL_TABLET | Freq: Every day | ORAL | 0 refills | Status: AC

## 2018-11-18 MED ORDER — VITAMIN C 500 MG PO TABS
500.0000 mg | ORAL_TABLET | Freq: Every day | ORAL | Status: DC
  Administered 2018-11-18 – 2018-11-25 (×8): 500 mg via ORAL
  Filled 2018-11-18 (×8): qty 1

## 2018-11-18 NOTE — Discharge Instructions (Addendum)
Orthopaedic Trauma Service Discharge Instructions   General Discharge Instructions  WEIGHT BEARING STATUS: Nonweightbearing Right Leg, Weightbearing as tolerated on Left leg with CAM boot on (black boot)  RANGE OF MOTION/ACTIVITY: ok to move toes and foot on R leg. Unrestricted motion left foot and ankle   Bone health: your vitamin d levels are ok but would recommend daily vitamin d 3 supplementation. Take 5000 IUs of vitamin d3 daily   Wound Care: daily wound care starting on discharge from hospital. See below  Discharge Wound Care Instructions  Do NOT apply any ointments, solutions or lotions to pin sites or surgical wounds.  These prevent needed drainage and even though solutions like hydrogen peroxide kill bacteria, they also damage cells lining the pin sites that help fight infection.  Applying lotions or ointments can keep the wounds moist and can cause them to breakdown and open up as well. This can increase the risk for infection. When in doubt call the office.  Surgical incisions should be dressed daily.  If any drainage is noted, use one layer of adaptic, then gauze, Kerlix, and an ace wrap.  Once the incision is completely dry and without drainage, it may be left open to air out.  Showering may begin 36-48 hours later.  Cleaning gently with soap and water.  Traumatic wounds should be dressed daily as well.    One layer of adaptic, gauze, Kerlix, then ace wrap.  The adaptic can be discontinued once the draining has ceased    Diet: as you were eating previously.  Can use over the counter stool softeners and bowel preparations, such as Miralax, to help with bowel movements.  Narcotics can be constipating.  Be sure to drink plenty of fluids  PAIN MEDICATION USE AND EXPECTATIONS  You have likely been given narcotic medications to help control your pain.  After a traumatic event that results in an fracture (broken bone) with or without surgery, it is ok to use narcotic pain  medications to help control one's pain.  We understand that everyone responds to pain differently and each individual patient will be evaluated on a regular basis for the continued need for narcotic medications. Ideally, narcotic medication use should last no more than 6-8 weeks (coinciding with fracture healing).   As a patient it is your responsibility as well to monitor narcotic medication use and report the amount and frequency you use these medications when you come to your office visit.   We would also advise that if you are using narcotic medications, you should take a dose prior to therapy to maximize you participation.  IF YOU ARE ON NARCOTIC MEDICATIONS IT IS NOT PERMISSIBLE TO OPERATE A MOTOR VEHICLE (MOTORCYCLE/CAR/TRUCK/MOPED) OR HEAVY MACHINERY DO NOT MIX NARCOTICS WITH OTHER CNS (CENTRAL NERVOUS SYSTEM) DEPRESSANTS SUCH AS ALCOHOL   STOP SMOKING OR USING NICOTINE PRODUCTS!!!!  As discussed nicotine severely impairs your body's ability to heal surgical and traumatic wounds but also impairs bone healing.  Wounds and bone heal by forming microscopic blood vessels (angiogenesis) and nicotine is a vasoconstrictor (essentially, shrinks blood vessels).  Therefore, if vasoconstriction occurs to these microscopic blood vessels they essentially disappear and are unable to deliver necessary nutrients to the healing tissue.  This is one modifiable factor that you can do to dramatically increase your chances of healing your injury.    (This means no smoking, no nicotine gum, patches, etc)  DO NOT USE NONSTEROIDAL ANTI-INFLAMMATORY DRUGS (NSAID'S)  Using products such as Advil (ibuprofen), Aleve (  naproxen), Motrin (ibuprofen) for additional pain control during fracture healing can delay and/or prevent the healing response.  If you would like to take over the counter (OTC) medication, Tylenol (acetaminophen) is ok.  However, some narcotic medications that are given for pain control contain acetaminophen  as well. Therefore, you should not exceed more than 4000 mg of tylenol in a day if you do not have liver disease.  Also note that there are may OTC medicines, such as cold medicines and allergy medicines that my contain tylenol as well.  If you have any questions about medications and/or interactions please ask your doctor/PA or your pharmacist.      ICE AND ELEVATE INJURED/OPERATIVE EXTREMITY  Using ice and elevating the injured extremity above your heart can help with swelling and pain control.  Icing in a pulsatile fashion, such as 20 minutes on and 20 minutes off, can be followed.    Do not place ice directly on skin. Make sure there is a barrier between to skin and the ice pack.    Using frozen items such as frozen peas works well as the conform nicely to the are that needs to be iced.  USE AN ACE WRAP OR TED HOSE FOR SWELLING CONTROL  In addition to icing and elevation, Ace wraps or TED hose are used to help limit and resolve swelling.  It is recommended to use Ace wraps or TED hose until you are informed to stop.    When using Ace Wraps start the wrapping distally (farthest away from the body) and wrap proximally (closer to the body)   Example: If you had surgery on your leg or thing and you do not have a splint on, start the ace wrap at the toes and work your way up to the thigh        If you had surgery on your upper extremity and do not have a splint on, start the ace wrap at your fingers and work your way up to the upper arm  IF YOU ARE IN A SPLINT OR CAST DO NOT [place]   If your splint gets wet for any reason please contact the office immediately. You may shower in your splint or cast as long as you keep it dry.  This can be done by wrapping in a cast cover or garbage back (or similar)  Do Not stick any thing down your splint or cast such as pencils, money, or hangers to try and scratch yourself with.  If you feel itchy take benadryl as prescribed on the bottle for  itching  IF YOU ARE IN A CAM BOOT (BLACK BOOT)  You may remove boot periodically. Perform daily dressing changes as noted below.  Wash the liner of the boot regularly and wear a sock when wearing the boot. It is recommended that you sleep in the boot until told otherwise  CALL THE OFFICE WITH ANY QUESTIONS OR CONCERNS: [phone]
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
## 2018-11-19 NOTE — Progress Notes (Signed)
Patient ID: Sarah Weeks, female   DOB: [birth year], 63 y.o.   MRN: [id number] Patient awake and alert and pleasant and interactive.  Moves all extremities.  She denies headache.  No nausea vomiting.  No visual changes.  No new recommendations.

## 2018-11-19 NOTE — Progress Notes (Signed)
Obtain MRI Aaron Edelman when possible and call Neurology. Outpatient neurology follow up.
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
## 2018-11-20 ENCOUNTER — Inpatient Hospital Stay (HOSPITAL_COMMUNITY): Payer: BLUE CROSS/BLUE SHIELD

## 2018-11-20 MED ORDER — OXYCODONE HCL 5 MG PO TABS
5.0000 mg | ORAL_TABLET | ORAL | Status: DC | PRN
  Administered 2018-11-20 (×2): 5 mg via ORAL
  Administered 2018-11-22: 10 mg via ORAL
  Administered 2018-11-22: 5 mg via ORAL
  Administered 2018-11-23 – 2018-11-25 (×5): 10 mg via ORAL
  Filled 2018-11-20 (×7): qty 2
  Filled 2018-11-20: qty 1
  Filled 2018-11-20: qty 2

## 2018-11-20 MED ORDER — METHOCARBAMOL 500 MG PO TABS
500.0000 mg | ORAL_TABLET | Freq: Three times a day (TID) | ORAL | Status: DC
  Administered 2018-11-20 – 2018-11-25 (×16): 500 mg via ORAL
  Filled 2018-11-20 (×15): qty 1

## 2018-11-20 NOTE — Progress Notes (Signed)
Ok to go to MRI w/out Therapist, sports per Jackson Latino, PA.
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
## 2018-11-21 ENCOUNTER — Encounter (HOSPITAL_COMMUNITY): Payer: Self-pay | Admitting: Physical Medicine and Rehabilitation

## 2018-11-21 DIAGNOSIS — S92012S Displaced fracture of body of left calcaneus, sequela: Secondary | ICD-10-CM

## 2018-11-21 DIAGNOSIS — S82831S Other fracture of upper and lower end of right fibula, sequela: Secondary | ICD-10-CM

## 2018-11-21 DIAGNOSIS — S82301S Unspecified fracture of lower end of right tibia, sequela: Secondary | ICD-10-CM

## 2018-11-21 DIAGNOSIS — F1020 Alcohol dependence, uncomplicated: Secondary | ICD-10-CM
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
## 2018-11-21 NOTE — Progress Notes (Signed)
Inpatient Rehabilitation-Admissions Coordinator   Noted pt's insurance is out of network with [facility]. Pt interested in a CIR program and would be a great candidate. Unfortunately, given limited coverage as she is out of network, pt is unable to afford CIR here at [facility]. She is hopeful for other local CIR programs that would be in-network. AC has contacted CM regarding recommendation for outside referrals. AC will sign off.   Jhonnie Garner, OTR/L  Rehab Admissions Coordinator  [phone] 11/21/2018 5:06 PM
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
## 2018-11-21 NOTE — Progress Notes (Signed)
MRI noted to be normal. Please call with further questions or concerns.   Roland Rack, MD [facility] [phone]  If 7pm- 7am, please page neurology on call as listed in [place].
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
## 2018-11-21 NOTE — Progress Notes (Signed)
Pt alert and oriented and pleasant. Last night and toning pt reports that she no longer takes Trazadone as it proved to be bad for her.

## 2018-11-22 MED ORDER — METOPROLOL SUCCINATE ER 50 MG PO TB24
50.0000 mg | ORAL_TABLET | Freq: Every day | ORAL | Status: DC
  Administered 2018-11-23 – 2018-11-25 (×3): 50 mg via ORAL
  Filled 2018-11-22 (×3): qty 1

## 2018-11-22 NOTE — Progress Notes (Signed)
No acute events overnight. VSS.
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
## 2018-11-22 NOTE — Plan of Care (Signed)
Progressing on all goals at this time.
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
## 2018-11-23 MED ORDER — DIPHENHYDRAMINE HCL 25 MG PO CAPS: 25.0000 mg | ORAL_CAPSULE | Freq: Four times a day (QID) | ORAL | Status: DC | PRN

## 2018-11-23 NOTE — TOC Progression Note (Signed)
Transition of Care [facility]) - Progression Note    Patient Details  Name: Sarah Weeks MRN: [id number] Date of Birth: [birth year]  Transition of [facility] CM/SW Contact  Oren Section Cleta Alberts, RN Phone Number: 11/23/2018, 3:32 PM  Clinical Narrative:   Sarah Weeks with Sarah Weeks at [facility] at [facility]; pt has been accepted for admission to CIR there, pending insurance authorization.  Pt plans to return home after rehab with 24h assist from friends and family.  Will provide updates on insurance auth as they are available.      Expected Discharge Plan: IP Rehab Facility Barriers to Discharge: Continued Medical Work up, Ship broker  Expected Discharge Plan and Services Expected Discharge Plan: [place]   Discharge Planning Services: CM Consult Post Acute Care Choice: IP Rehab Living arrangements for the past 2 months: Single Family Home Expected Discharge Date: 11/17/18                                         Readmission Risk Interventions No flowsheet data found.  Reinaldo Raddle, RN, BSN  Trauma/Neuro ICU Case Manager [phone]
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
## 2018-11-24 NOTE — Care Management (Signed)
Notified by Tally Due at [facility] in [facility] that pt will need COVID neg test within 48h of admission to their facility.  Notified attending who will order test; plan dc tomorrow, pending test results.    Reinaldo Raddle, RN, BSN  Trauma/Neuro ICU Case Manager [phone]

## 2018-11-25 LAB — NOVEL CORONAVIRUS, NAA (HOSP ORDER, SEND-OUT TO REF LAB; TAT 18-24 HRS): SARS-CoV-2, NAA: NOT DETECTED

## 2018-11-25 MED ORDER — METHOCARBAMOL 500 MG PO TABS: 500.0000 mg | ORAL_TABLET | Freq: Three times a day (TID) | ORAL | Status: AC

## 2018-11-25 MED ORDER — ALUM & MAG HYDROXIDE-SIMETH 200-200-20 MG/5ML PO SUSP: 30.0000 mL | ORAL | 0 refills | Status: AC | PRN

## 2018-11-25 MED ORDER — ACETAMINOPHEN 325 MG PO TABS: 650.0000 mg | ORAL_TABLET | Freq: Four times a day (QID) | ORAL | Status: AC

## 2018-11-25 MED ORDER — POLYETHYLENE GLYCOL 3350 17 G PO PACK: 17.0000 g | PACK | Freq: Every day | ORAL | 0 refills | Status: AC | PRN

## 2018-11-25 MED ORDER — OXYCODONE HCL 5 MG PO TABS: 5.0000 mg | ORAL_TABLET | ORAL | 0 refills | Status: AC | PRN

## 2018-11-25 MED ORDER — CALCIUM CARBONATE ANTACID 500 MG PO CHEW: 1.0000 | CHEWABLE_TABLET | Freq: Three times a day (TID) | ORAL | Status: AC | PRN

## 2018-11-25 MED ORDER — DIPHENHYDRAMINE HCL 25 MG PO CAPS: 25.0000 mg | ORAL_CAPSULE | Freq: Four times a day (QID) | ORAL | 0 refills | Status: AC | PRN

## 2018-11-25 MED ORDER — ENOXAPARIN SODIUM 40 MG/0.4ML ~~LOC~~ SOLN: 40.0000 mg | SUBCUTANEOUS | Status: AC

## 2018-11-25 MED ORDER — HALOPERIDOL 2 MG PO TABS: 2.0000 mg | ORAL_TABLET | Freq: Four times a day (QID) | ORAL | Status: AC | PRN

## 2018-11-25 NOTE — Progress Notes (Signed)
Pt with d/c orders. Discharge paperwork reviewed with pt. Copy given to pt as well as one placed in chart for PTAR. IV removed. Pt with belongings at time of discharge.
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
## 2018-11-25 NOTE — Care Management (Signed)
Faxed COVID results, current MAR and dc summary to [facility] facility at [phone].    Bedside nurse to call report to [phone].    PT[facility] called for transport at 2:30pm.    Reinaldo Raddle, RN, BSN  Trauma/Neuro ICU Case Manager [phone]

## 2018-11-28 ENCOUNTER — Encounter: Payer: Self-pay | Admitting: Emergency Medicine

## 2018-12-08 ENCOUNTER — Telehealth: Payer: Self-pay | Admitting: Nurse Practitioner

## 2018-12-08 NOTE — Telephone Encounter (Signed)
Disregaud encounter
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
## 2020-06-05 IMAGING — CT CT CHEST W/ CM
2 of 4 series · 15 of 36 positions shown, 18 images · IV contrast (iopamidol)
Comparison: Chest radiographs 04/13/2018, 04/02/2015. CT Abdomen
and Pelvis 01/17/2016.

CLINICAL DATA: 61-year-old female with 3 centimeter upper lobe
opacity on chest radiographs 04/13/2018 performed for lower
extremity edema. The patient denied chest pain or cough at that
time. Former smoker.

EXAM:
CT CHEST WITH CONTRAST
TECHNIQUE: Multidetector CT imaging of the chest was performed during
intravenous contrast administration.
CONTRAST:  75mL OVBVND-MAA IOPAMIDOL (OVBVND-MAA) INJECTION 61%

[Series 2: axial chest · axial · 0.61mm/px · z∈[-1278,-994]mm · 12 of 168 slices shown, 15 images]
[im 13/168  mediastinal]
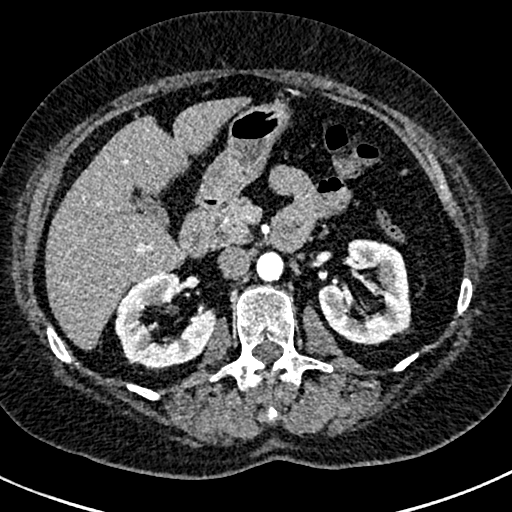
[im 13/168  lung]
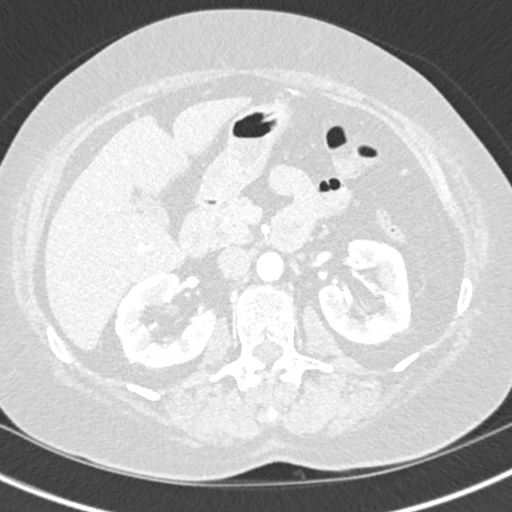
[im 26/168  lung]
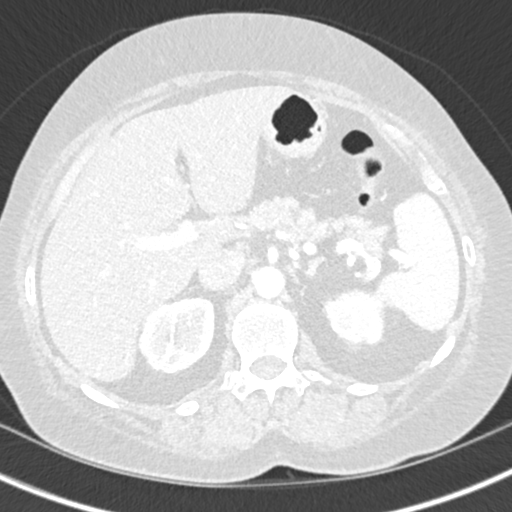
[im 39/168  lung]
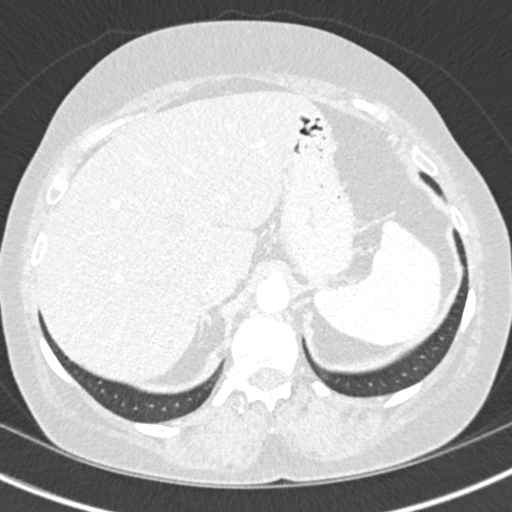
[im 52/168  lung]
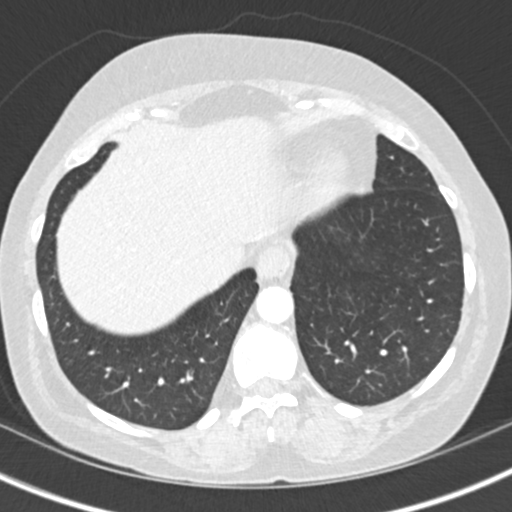
[im 65/168  mediastinal]
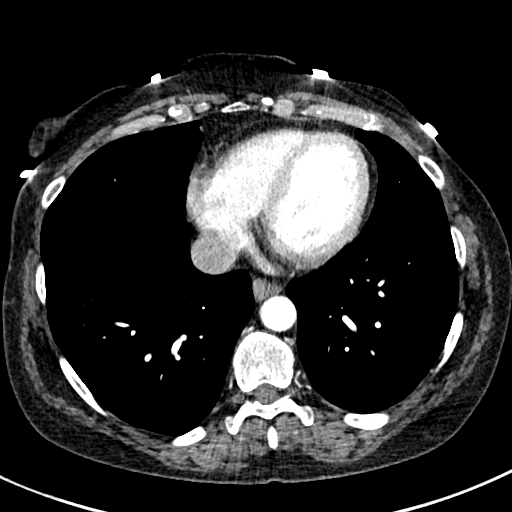
[im 65/168  lung]
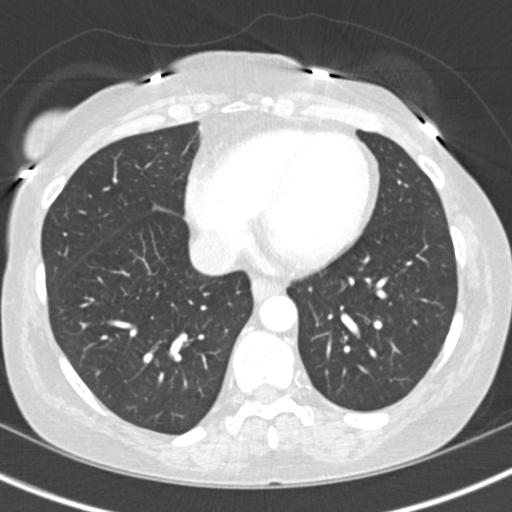
[im 78/168  lung]
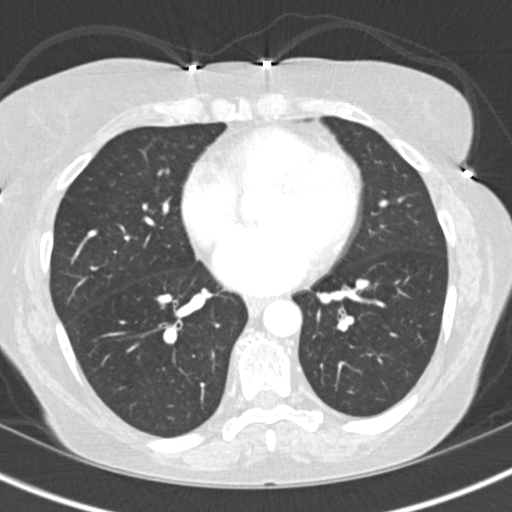
[im 90/168  lung]
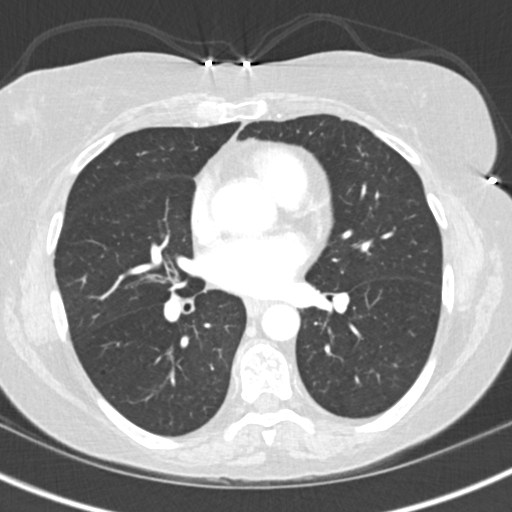
[im 103/168  lung]
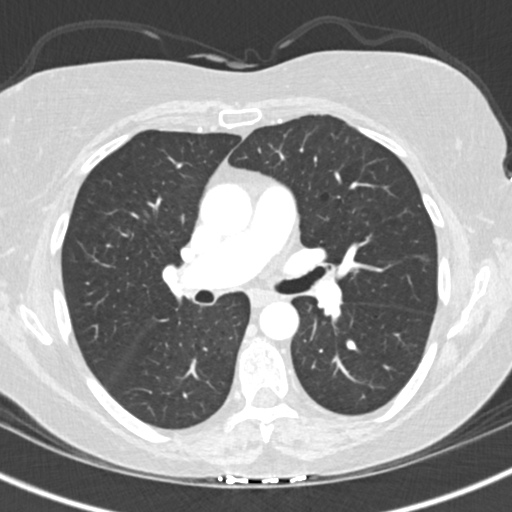
[im 116/168  mediastinal]
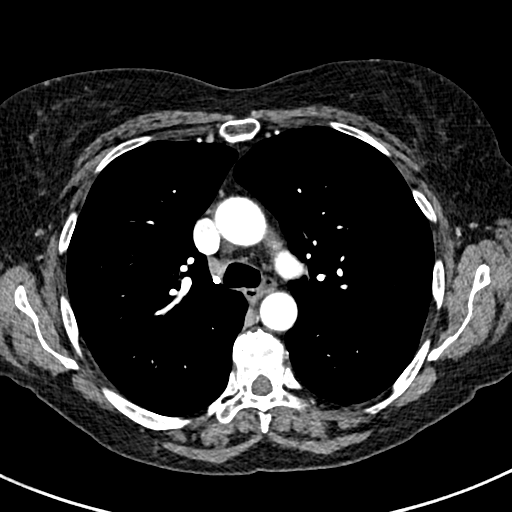
[im 116/168  lung]
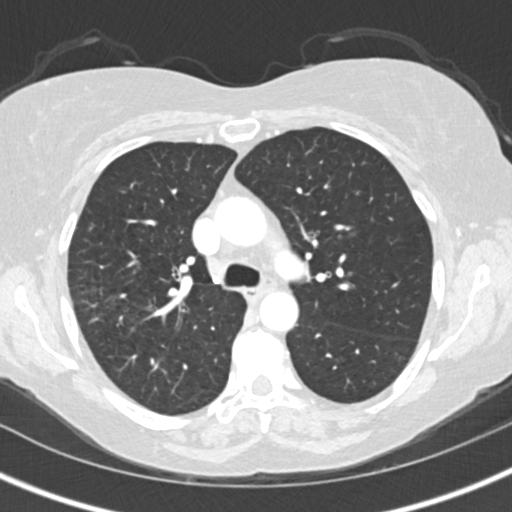
[im 129/168  lung]
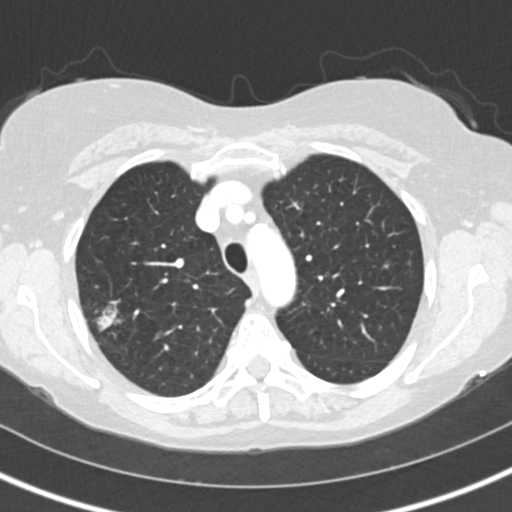
[im 142/168  lung]
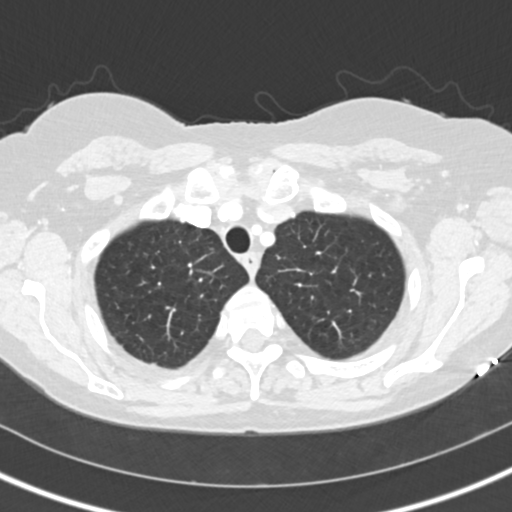
[im 155/168  lung]
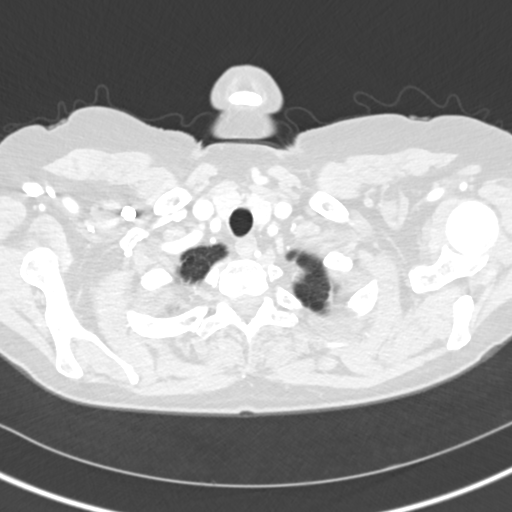

[Series 4: coronal chest · coronal · 0.61mm/px · 3 of 144 slices shown]
[im 29/144  lung]
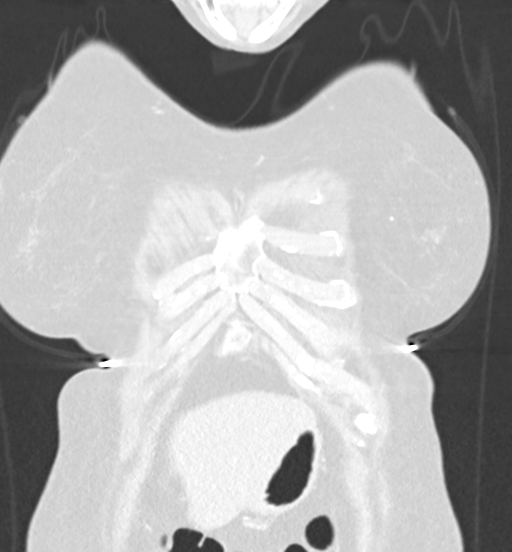
[im 58/144  lung]
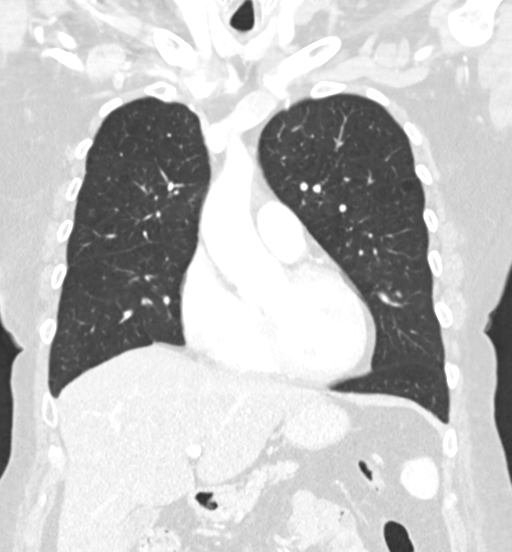
[im 86/144  lung]
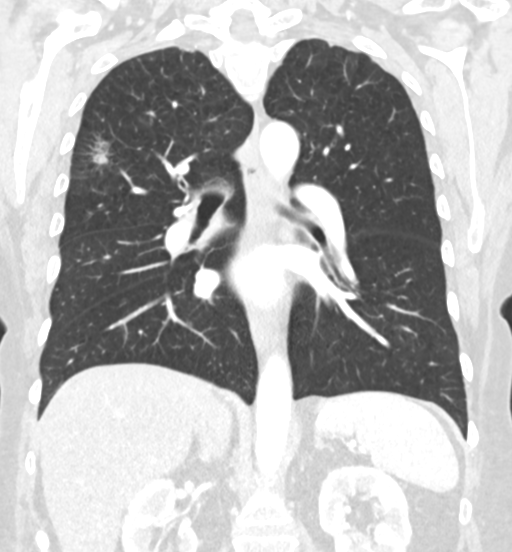

[15 of 36 positions shown; findings below may reference images not displayed]

FINDINGS: Cardiovascular: No cardiomegaly or pericardial effusion. Mild
Calcified aortic atherosclerosis. Possible calcified coronary artery
atherosclerosis on series 2, image 80. The central pulmonary
arteries are patent.

Mediastinum/Nodes: Right hilar nodal tissue measures 7 millimeter
short axis on series 2, image 63, within normal limits.
Contralateral left hilar nodal tissue is 5 millimeters. No
mediastinal lymphadenopathy. Negative thoracic inlet.

Lungs/Pleura: Persistent upper lobe opacity which is spiculated and
rounded encompassing 37 x 29 x 32 millimeters (AP by transverse by
CC). There is a punctate calcification along the superolateral
margin of the lesion, which also has some faint air bronchograms.

Underlying centrilobular emphysema. The major airways are patent
with mild generalized peribronchial thickening. No pleural effusion.
There is a calcified granuloma in the left lung along the major
fissure near the mediastinum. No other confluent pulmonary opacity.

Upper Abdomen: Evidence of hepatic steatosis but otherwise negative
visible liver. Negative visible gallbladder, spleen pancreas,
adrenal glands, kidneys, and bowel in the upper abdomen.

Musculoskeletal: Evidence of midthoracic spine disc degeneration
including probable T7-T8 disc herniation on series 6, image 81. No
acute or suspicious osseous lesion.
IMPRESSION: 1. Spiculated and rounded 3.7 cm upper lobe opacity in the right
upper lobe is highly suspicious for Bronchogenic Carcinoma.
Underlying Emphysema (J68DH-[id number].6).
Consultation with Pulmonary Medicine or Thoracic Surgery is
recommended.
PET-CT would be the best next imaging step as per: Guidelines for
Management of Incidental Pulmonary Nodules Detected on CT Images:
2. Right hilar nodal tissue is conspicuous and asymmetric but
remains normal by size criteria. No mediastinal lymphadenopathy.
3. Hepatic steatosis.

These results will be called to the ordering clinician or
representative by the Radiologist Assistant, and communication
documented in the PACS or zVision Dashboard.
